# Patient Record
Sex: Female | Born: 1990 | Race: Black or African American | Hispanic: No | Marital: Single | Smoking: Former smoker
Health system: Southern US, Community
[De-identification: ages and names within clinical notes are randomized; demographics above are authoritative.]

## PROBLEM LIST (undated history)

## (undated) ENCOUNTER — Emergency Department (HOSPITAL_COMMUNITY): Admission: EM | Discharge status: Absent without leave | Payer: Self-pay

## (undated) DIAGNOSIS — I10 Essential (primary) hypertension: Secondary | ICD-10-CM

## (undated) DIAGNOSIS — J45909 Unspecified asthma, uncomplicated: Secondary | ICD-10-CM

## (undated) DIAGNOSIS — N809 Endometriosis, unspecified: Secondary | ICD-10-CM

## (undated) DIAGNOSIS — R519 Headache, unspecified: Secondary | ICD-10-CM

## (undated) DIAGNOSIS — E282 Polycystic ovarian syndrome: Secondary | ICD-10-CM

## (undated) DIAGNOSIS — J069 Acute upper respiratory infection, unspecified: Secondary | ICD-10-CM

## (undated) DIAGNOSIS — K219 Gastro-esophageal reflux disease without esophagitis: Secondary | ICD-10-CM

## (undated) DIAGNOSIS — G8929 Other chronic pain: Secondary | ICD-10-CM

## (undated) DIAGNOSIS — L509 Urticaria, unspecified: Secondary | ICD-10-CM

## (undated) DIAGNOSIS — R51 Headache: Secondary | ICD-10-CM

## (undated) HISTORY — PX: TONSILLECTOMY: SHX5618

## (undated) HISTORY — DX: Urticaria, unspecified: L50.9

## (undated) HISTORY — DX: Other chronic pain: G89.29

## (undated) HISTORY — DX: Gastro-esophageal reflux disease without esophagitis: K21.9

## (undated) HISTORY — DX: Headache, unspecified: R51.9

## (undated) HISTORY — DX: Unspecified asthma, uncomplicated: J45.909

## (undated) HISTORY — PX: FINGER SURGERY: SHX640

## (undated) HISTORY — PX: TONSILLECTOMY: SUR1361

## (undated) HISTORY — DX: Acute upper respiratory infection, unspecified: J06.9

## (undated) HISTORY — DX: Headache: R51

## (undated) HISTORY — PX: HAND SURGERY: SHX662

## (undated) NOTE — Telephone Encounter (Signed)
Formatting of this note might be different from the original.  Patient called in regards to Accomodation forms. Wants 1-5 days off a month. Approved by Dr Mahlon Gammon for [phone] for a month. Forms updated and faxed to patient. Fax number [phone]  Electronically signed by Idelia Salm, LPN at 14/78/2956  3:17 PM EDT

## (undated) NOTE — Progress Notes (Signed)
Formatting of this note might be different from the original.  She was about 4 days postop from diagnostic laparoscopy was found to have endometriosis she has a history of dysmenorrhea as well.  So after feeling because she had started.  On the day of her cycle and now is in the midst of her.  And knowing that she definitely has a endometriosis this has exacerbated her pain and I will go ahead and give Dilaudid p.o. 2 mg because the oxycodone really did not seem to help too much she can alternate the Dilaudid with ibuprofen or Tylenol as needed as well and she has a follow up with Dr. Mahlon Gammon for her postop check.  The incision sites are clean dry and intact no signs of infection    My total time on this date and for this encounter was 20 minutes which included the following activities preparing to see the patient, obtaining and/or reviewing separately obtained history, performing a medically necessary exam and/or evaluation, ordering medications, tests or procedures, and documenting clinical information in the medical record. This time is independent, non-overlapping and does not include time for any services which are separately reported.    Rob Bunting, MD  Electronically signed by Rob Bunting, MD at 01/07/2023 10:03 AM EDT

## (undated) NOTE — Telephone Encounter (Signed)
Formatting of this note might be different from the original.  Patient called back to the office requesting her forms be faxed to [phone],  Forms faxed to provided number.  Patient was called but did not answer, Left message for patient to return call.   Electronically signed by Dorothe Pea, LPN at 09/81/1914  3:50 PM EDT

## (undated) NOTE — Telephone Encounter (Signed)
Formatting of this note might be different from the original.  Images from the original note were not included.      PA intiated  Electronically signed by Lidia Collum, LPN at 16/03/9603 11:08 AM EDT

## (undated) NOTE — Telephone Encounter (Signed)
Formatting of this note might be different from the original.  advised patient that her paperwork has been signed and faxed to  [employer] [phone].  Patient voiced understanding and all questions and concerns have been answered.  Electronically signed by Dorothe Pea, LPN at 29/52/8413 12:09 PM EDT

## (undated) NOTE — Telephone Encounter (Signed)
Formatting of this note is different from the original.  Images from the original note were not included.  form updated to reflect 1-5d/mo x65mo  Camillo Flaming, MD  [facility]22 hours ago (11:11 AM)     Hi-please change the paperwork to reflect 1-5 days off per month for 6 months. thanks     Electronically signed by Linus Salmons, LPN at 95/62/1308  9:55 AM EDT

## (undated) NOTE — Progress Notes (Signed)
Formatting of this note is different from the original.  Subjective     Patient ID: Angela [name] is a(n) 49 y.o. female.    HPI    31yo P1 s/p dx laparoscopy for CPP/dysmenorrhea/dyspareunia despite COC use.  She was found to have stage 2 endometriosis fairly widely distributed in the pelvis and diaphragm.  Reviewed endometriosis diagnosis and treatment options    She denies sob/cp/ct/n/v/d/f./dysuria/constipation.  Requests additional motrin script.  On menses now and finding it very painful.    We discussed mgt options for endometriosis. She declines a trial of an IUD/nexplanon/depo provera. She would like to try orilissa since continuous contraceptive use has failed to alleviate her pelvic pain and dyspareunia. We reviewed use/SE.  BMI:44    Some left thigh pain since surgery. no associated sob/cp. constant. no leg swelling/redness.    Patient's medications, allergies, past medical, surgical, social and family histories were reviewed and updated as appropriate.  Patient Active Problem List    Diagnosis Date Noted    Endometriosis determined by laparoscopy 01/04/2023    Pelvic pain 12/13/2022    Dysmenorrhea 12/13/2022    Abnormal uterine bleeding 03/15/2022     Outpatient Medications Prior to Visit   Medication Sig    amLODIPine (NORVASC) 2.5 MG tablet Take 2 tablets by mouth nightly.    chlorthalidone 25 MG tablet Take 1 tablet by mouth daily.    diphenhydrAMINE HCl (BENADRYL PO) Take by mouth as needed.    docusate sodium (COLACE) 100 MG capsule Take 1 capsule by mouth 2 times daily.    HYDROmorphone (DILAUDID) 2 MG tablet Take 1 tablet by mouth every 4 hours as needed for pain.    ibuprofen (MOTRIN) 800 MG tablet Take 1 tablet by mouth every 6 hours as needed for pain.  Take with food    norethindrone (MICRONOR) 0.35 MG tablet Take 1 tablet by mouth daily.    oxyCODONE-acetaminophen (PERCOCET) 5-325 MG per tablet Take 1 tablet by mouth every 6 hours as needed for pain.    promethazine (PHENERGAN) 25 MG  tablet Take 1 tablet by mouth every 6 hours as needed for nausea.    promethazine (PHENERGAN) 25 MG tablet Take 1 tablet by mouth every 6 hours as needed for nausea.     Past Medical History:    Endometriosis determined by laparoscopy    Hypertension     Past Surgical History:   Procedure Laterality Date    HAND SURGERY Left     PR LAPAROSCOPY SURG W/BX SINGLE/MULTIPLE N/A 01/04/2023    OPERATIVE AND DIAGNOSTIC LAPAROSCOPY performed by Camillo Flaming, MD at [facility] OR    TONSILLECTOMY       Review of Systems   Constitutional: Negative.    Respiratory: Negative.     Cardiovascular: Negative.    Gastrointestinal: Negative.    Genitourinary:  Positive for dyspareunia, menstrual problem and pelvic pain.   Psychiatric/Behavioral: Negative.       Objective     Physical Exam  Constitutional:       Appearance: Normal appearance. She is obese.   HENT:      Head: Normocephalic and atraumatic.   Abdominal:      Palpations: Abdomen is soft. There is no mass.      Tenderness: There is abdominal tenderness. There is no guarding or rebound.      Comments: no incisional pain but does have lower pelvic tenderness related to current dysmenorrhea/endometriosis pain  no rubor/calor/dolor   Musculoskeletal:  General: Normal range of motion.   Skin:     General: Skin is warm and dry.   Neurological:      General: No focal deficit present.      Mental Status: She is alert and oriented to person, place, and time.   Psychiatric:         Mood and Affect: Mood normal.         Behavior: Behavior normal.     Assessment & Plan     31yo P1 with symptomatic endometriosis/CPP/dyspareunia unresponsive to medical management/left LE pain  -trial of orilissa  menstrual/CPP symptom diary  venous doppler left leg (low clinical suspicion for DVT)  UpToDate info on endometriosis provided for review  Electronically signed by Camillo Flaming, MD at 01/21/2023  9:09 AM EDT

## (undated) NOTE — Telephone Encounter (Signed)
Formatting of this note might be different from the original.  ----- Message from Dorna Leitz sent at 02/14/2023  3:26 PM EDT -----  Regarding: FW: Forms  Patient called back and stated that she never got fax and to see if it can be sent again. One number was wrong on fax number. Please re fax # to [phone]. Thank you  ----- Message -----  From: Dorna Leitz  Sent: 02/14/2023   1:09 PM EDT  To: [facility]  Subject: Forms                                            Reason for the Call:   Patient is calling because forms that were faxed on 8/15 didn't go through so patient would like to know if they can just be faxed to her at [phone] so she can just take them in. Please give her a call to discuss if needed or let her know when faxed. Thank you    Caller's Name (if not the patient):     Relationship to Patient (if not self):    Best Contact Number: [phone]    OK to leave a detailed message?    Additional Comments:           Electronically signed by Verl Dicker, LPN at 57/84/6962  9:47 AM EDT

## (undated) NOTE — Telephone Encounter (Signed)
Formatting of this note might be different from the original.  Left pt. message  Forms has been faxed to [phone]  Electronically signed by Verl Dicker, LPN at 09/81/1914  9:51 AM EDT

## (undated) NOTE — Progress Notes (Signed)
Formatting of this note is different from the original.  Subjective:     Patient ID: Angela [name] is a(n) 12 y.o. female.    HPI    Patient presents today after being seen in the emergency room at [facility] on 12 02/14/2022 for lower back and a right lower quadrant abdominal cramping.  CT scan of the abdomen and pelvis was negative she has also not had a periods since 04/16/2022, hCG was negative.  Patient was started on Sprintec back in September, but has not taken for over 1 month but states that she is still not getting withdrawal bleeds while on Sprintec.  She is sexually active with her husband and not using birth control.  Initial blood pressure today was 150/120 repeat was 140/90.  Patient denies headache or vision changes.  Is having lower abdominal cramping as if she should have a cycle, but no bleeding.  Her TSH and prolactin levels in September were normal.  Patient denies any recent excessive weight gain or loss.    Patient's medications, allergies, past medical, surgical, social and family histories were reviewed and updated as appropriate.  Patient Active Problem List    Diagnosis Date Noted    Abnormal uterine bleeding 03/15/2022     Allergies   Allergen Reactions    Apple Hives    Apple Fruit Extract Hives    Banana Hives    Peanut (Diagnostic) Hives    Peanut-Derived Hives    Pear Hives    Plum Pulp Hives     Outpatient Medications Marked as Taking for the 06/29/22 encounter (Office Visit) with Dehal, Judeth Porch, MD   Medication Sig    diphenhydrAMINE HCl (BENADRYL PO) Take by mouth.    ibuprofen (MOTRIN) 800 MG tablet Take 1 tablet by mouth every 6 hours as needed for pain.  Take with food    promethazine (PHENERGAN) 25 MG tablet Take 1 tablet by mouth every 6 hours as needed.    traZODone (DESYREL) 50 MG tablet Take 1 tablet by mouth nightly.  AT BEDTIME    [DISCONTINUED] norgestimate-ethinyl estradiol (SPRINTEC 28) 0.25-35 MG-MCG per tablet Take 1 tablet by mouth daily.     History reviewed. No  pertinent past medical history.  Past Surgical History:   Procedure Laterality Date    HAND SURGERY Left     TONSILLECTOMY       Social History     Socioeconomic History    Marital status: Unknown     Spouse name: None    Number of children: None    Years of education: None    Highest education level: None   Tobacco Use    Smoking status: Never    Smokeless tobacco: Never   Vaping Use    Vaping Use: Never used   Substance and Sexual Activity    Alcohol use: Yes     Comment: occ    Drug use: Never    Sexual activity: Yes     Partners: Male     Birth control/protection: None, OCP     Review of Systems   Constitutional:  Negative for fatigue and fever.   Eyes:  Negative for visual disturbance.   Respiratory:  Negative for chest tightness and shortness of breath.    Cardiovascular:  Negative for chest pain.   Gastrointestinal:  Positive for abdominal pain.   Genitourinary:  Positive for menstrual problem and pelvic pain. Negative for vaginal bleeding and vaginal discharge.   Musculoskeletal:  Positive for back  pain.   Neurological:  Negative for light-headedness and headaches.       Objective:     Physical Exam  Vitals reviewed.   Constitutional:       Appearance: Normal appearance. She is obese. She is not ill-appearing.   Musculoskeletal:         General: Normal range of motion.   Skin:     General: Skin is warm and dry.   Neurological:      General: No focal deficit present.      Mental Status: She is alert and oriented to person, place, and time.   Psychiatric:         Mood and Affect: Mood normal.         Behavior: Behavior normal.         Thought Content: Thought content normal.         Judgment: Judgment normal.       Assessment/Plan:       Angela [name] was seen today for menstrual problem.    Diagnoses and all orders for this visit:    Amenorrhea  Uncertain as to the reason for patient's amenorrhea.  Workup so far has been negative.  Blood pressure is still elevated and so we would not be able to restart birth control  pills at this time, although the birth control pills did not achieve cycle control.  We will do progesterone challenge.  Prescription sent in for Provera 10 mg daily for 10 days to induce a withdrawal bleed.  Patient will call office if no withdrawal bleed occurs after discontinuation of the progesterone.  Recommend using condoms for birth control until we have figured out her cycles.-     medroxyPROGESTERone (PROVERA) 10 MG tablet; Take one tablet daily for 10 days each month as needed      Electronically signed by Dalia Heading, MD at 06/29/2022  4:19 PM EST

## (undated) NOTE — Telephone Encounter (Signed)
Formatting of this note might be different from the original.  ----- Message from Shelbie Hutching sent at 01/24/2023  1:02 PM EDT -----  Reason for Call: Pt is calling checking on her paperwork being completed.    Best Contact Number: [phone]    When did symptoms start and for how long?    Additional Information:     Electronically signed by Dorothe Pea, LPN at 16/03/9603 12:08 PM EDT

---

## 2011-02-23 ENCOUNTER — Inpatient Hospital Stay (INDEPENDENT_AMBULATORY_CARE_PROVIDER_SITE_OTHER)
Admission: RE | Admit: 2011-02-23 | Discharge: 2011-02-23 | Disposition: A | Discharge status: Home / Self Care | Payer: BC Managed Care – PPO | Source: Ambulatory Visit | Attending: Emergency Medicine | Admitting: Emergency Medicine

## 2011-02-23 DIAGNOSIS — N390 Urinary tract infection, site not specified: Secondary | ICD-10-CM

## 2011-02-23 DIAGNOSIS — R1032 Left lower quadrant pain: Secondary | ICD-10-CM

## 2011-02-23 LAB — POCT URINALYSIS DIP (DEVICE)
Ketones, ur: NEGATIVE mg/dL
Protein, ur: NEGATIVE mg/dL
Specific Gravity, Urine: 1.02 (ref 1.005–1.030)
pH: 6 (ref 5.0–8.0)

## 2011-02-23 LAB — POCT PREGNANCY, URINE: Preg Test, Ur: NEGATIVE

## 2011-02-23 LAB — WET PREP, GENITAL

## 2011-02-24 ENCOUNTER — Inpatient Hospital Stay (INDEPENDENT_AMBULATORY_CARE_PROVIDER_SITE_OTHER)
Admission: RE | Admit: 2011-02-24 | Discharge: 2011-02-24 | Disposition: A | Discharge status: Home / Self Care | Payer: BC Managed Care – PPO | Source: Ambulatory Visit | Attending: Emergency Medicine | Admitting: Emergency Medicine

## 2011-02-24 ENCOUNTER — Emergency Department (HOSPITAL_COMMUNITY)
Admission: EM | Admit: 2011-02-24 | Discharge: 2011-02-24 | Disposition: A | Discharge status: Home / Self Care | Payer: BC Managed Care – PPO | Attending: Emergency Medicine | Admitting: Emergency Medicine

## 2011-02-24 ENCOUNTER — Emergency Department (HOSPITAL_COMMUNITY): Payer: BC Managed Care – PPO

## 2011-02-24 DIAGNOSIS — R1032 Left lower quadrant pain: Secondary | ICD-10-CM | POA: Insufficient documentation

## 2011-02-24 DIAGNOSIS — R630 Anorexia: Secondary | ICD-10-CM | POA: Insufficient documentation

## 2011-02-24 DIAGNOSIS — R112 Nausea with vomiting, unspecified: Secondary | ICD-10-CM | POA: Insufficient documentation

## 2011-02-24 DIAGNOSIS — R109 Unspecified abdominal pain: Secondary | ICD-10-CM

## 2011-02-24 LAB — COMPREHENSIVE METABOLIC PANEL
AST: 19 U/L (ref 0–37)
Albumin: 4 g/dL (ref 3.5–5.2)
Alkaline Phosphatase: 40 U/L (ref 39–117)
Chloride: 101 mEq/L (ref 96–112)
Potassium: 3.5 mEq/L (ref 3.5–5.1)
Sodium: 134 mEq/L — ABNORMAL LOW (ref 135–145)
Total Bilirubin: 0.4 mg/dL (ref 0.3–1.2)

## 2011-02-24 LAB — DIFFERENTIAL
Basophils Absolute: 0 10*3/uL (ref 0.0–0.1)
Basophils Relative: 0 % (ref 0–1)
Eosinophils Absolute: 0 10*3/uL (ref 0.0–0.7)
Eosinophils Relative: 0 % (ref 0–5)
Lymphocytes Relative: 44 % (ref 12–46)

## 2011-02-24 LAB — CBC
Platelets: 226 10*3/uL (ref 150–400)
RDW: 11.5 % (ref 11.5–15.5)
WBC: 5.2 10*3/uL (ref 4.0–10.5)

## 2011-02-24 LAB — GC/CHLAMYDIA PROBE AMP, GENITAL: GC Probe Amp, Genital: NEGATIVE

## 2011-02-24 LAB — POCT URINALYSIS DIP (DEVICE)
Nitrite: NEGATIVE
pH: 8 (ref 5.0–8.0)

## 2011-10-17 IMAGING — CT CT ABD-PELV W/O CM
2 of 4 series · 17 of 46 positions shown, 19 images · non-contrast
Comparison: None.

CLINICAL DATA: 19-year-old female with abdominal pain on the left
with nausea and vomiting.

CT ABDOMEN AND PELVIS WITHOUT CONTRAST
TECHNIQUE: Multidetector CT imaging of the abdomen and pelvis was
performed following the standard protocol without intravenous
contrast.

[Series 2: stone <(id) >(id) · axial · 0.70mm/px · z∈[-445,-85]mm · 14 of 78 slices shown, 16 images]
[im 4/78  soft-tissue]
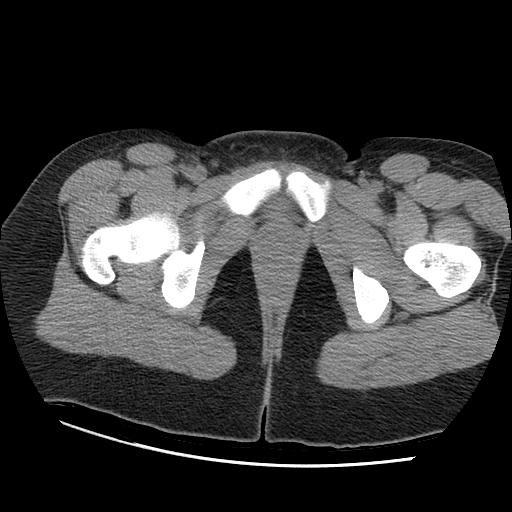
[im 4/78  bone]
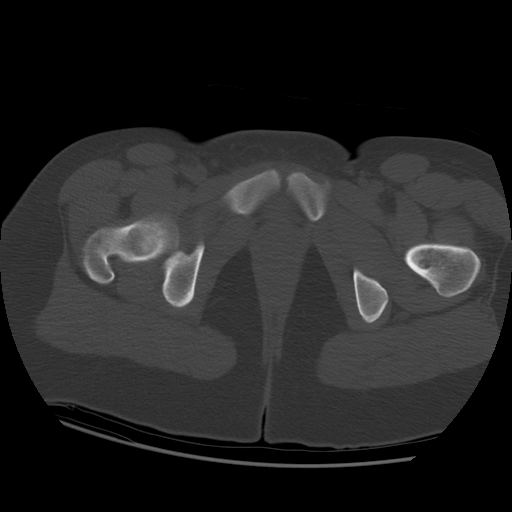
[im 11/78  soft-tissue]
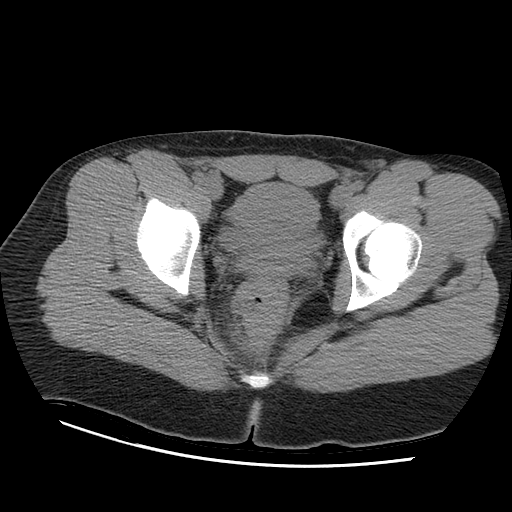
[im 14/78  soft-tissue]
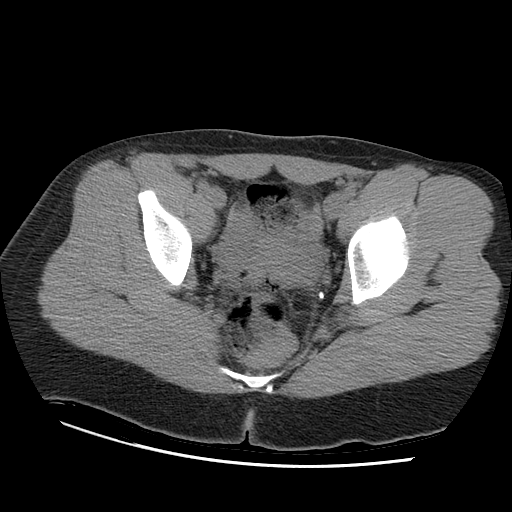
[im 21/78  soft-tissue]
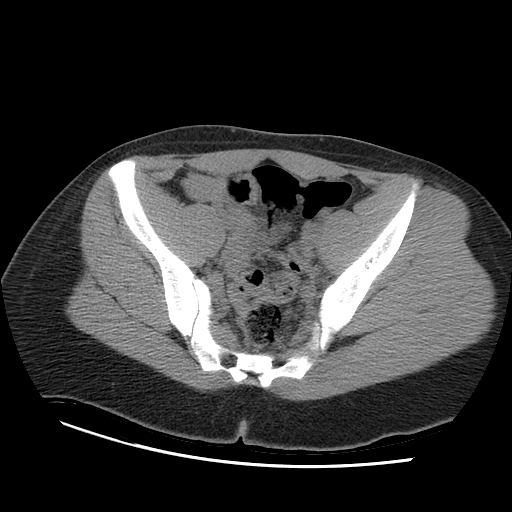
[im 27/78  soft-tissue]
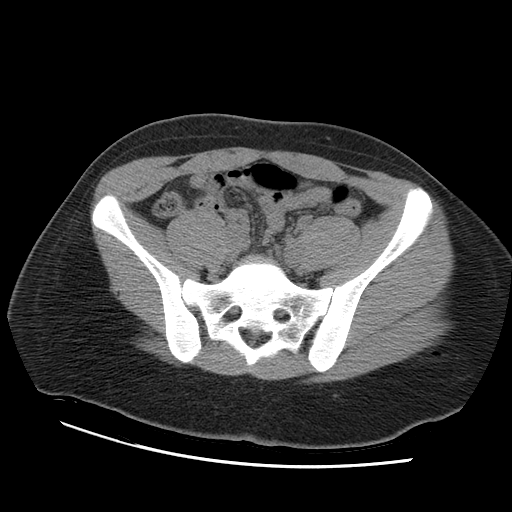
[im 31/78  soft-tissue]
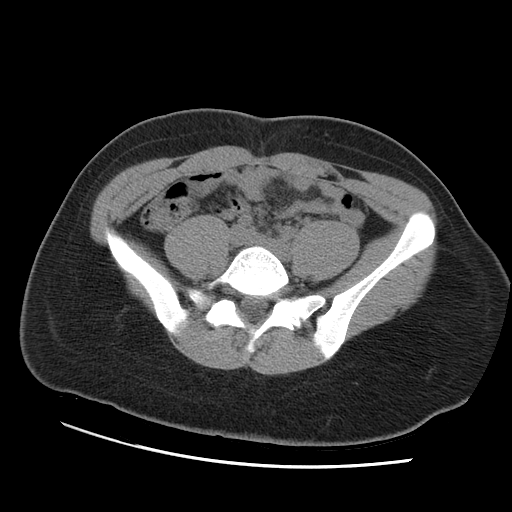
[im 37/78  soft-tissue]
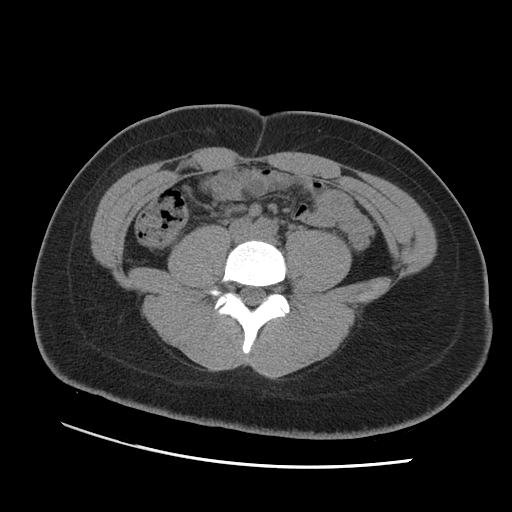
[im 41/78  soft-tissue]
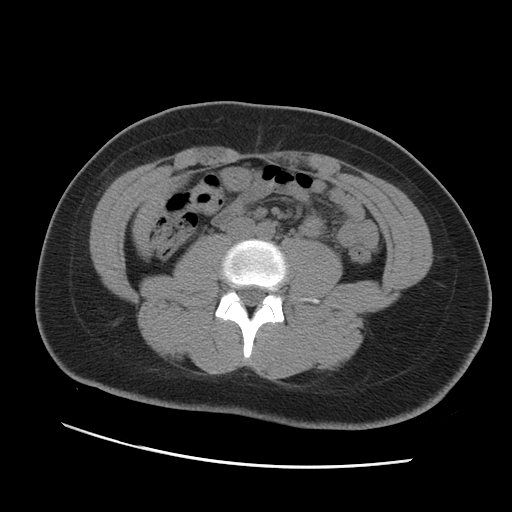
[im 47/78  soft-tissue]
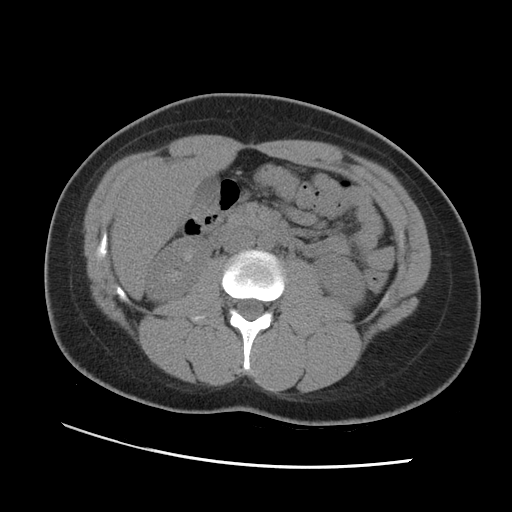
[im 47/78  bone]
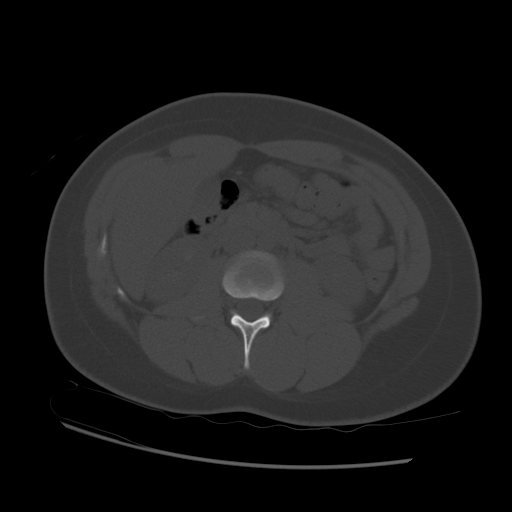
[im 51/78  soft-tissue]
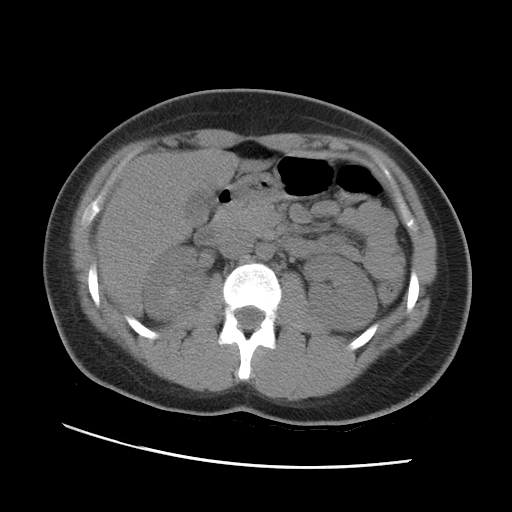
[im 57/78  soft-tissue]
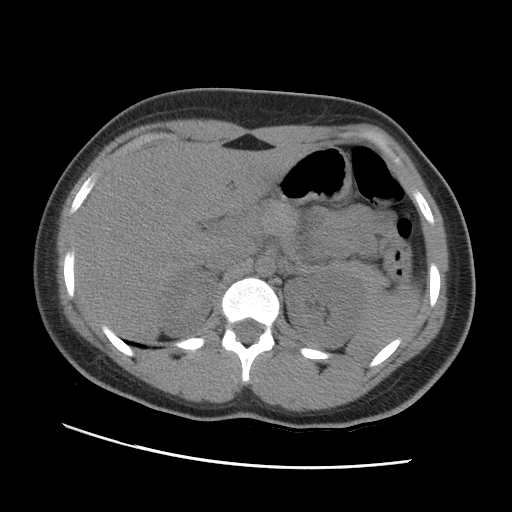
[im 64/78  soft-tissue]
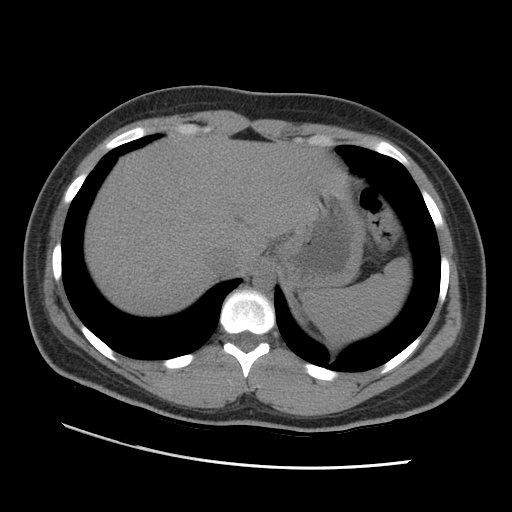
[im 67/78  soft-tissue]
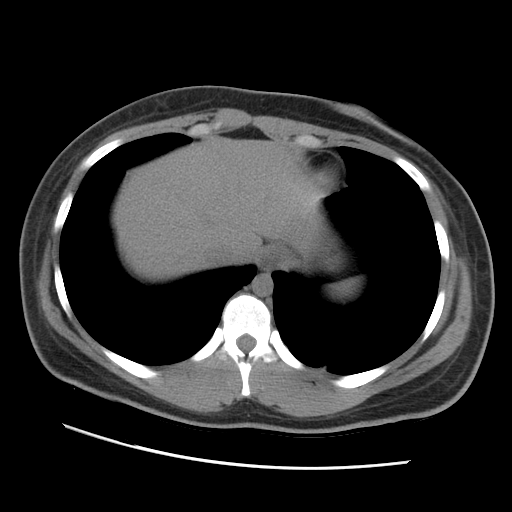
[im 74/78  soft-tissue]
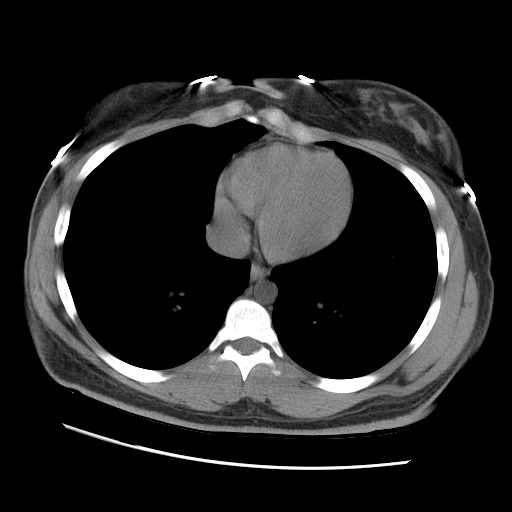

[Series 401: reformatted · coronal · 0.89mm/px · 3 of 88 slices shown]
[im 30/88  soft-tissue]
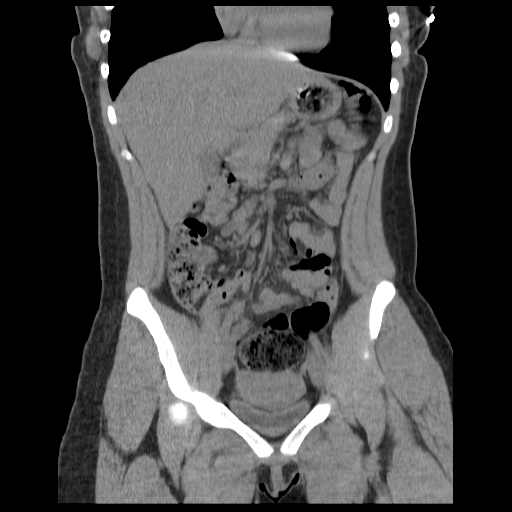
[im 39/88  soft-tissue]
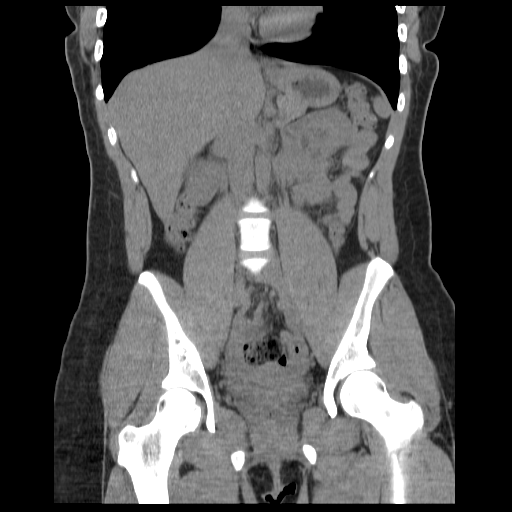
[im 49/88  soft-tissue]
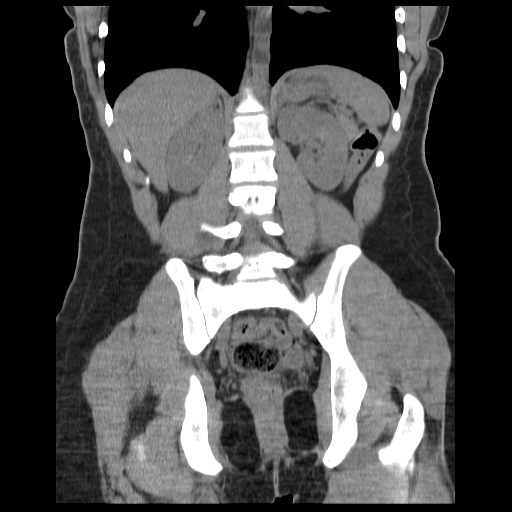

[17 of 46 positions shown; findings below may reference images not displayed]

FINDINGS: Minor atelectasis or scarring in the left costophrenic
sulcus, otherwise clear lung bases. No acute osseous abnormality
identified.  No pelvic free fluid.  Negative noncontrast uterus and
adnexa.  Retained stool the distal colon.  Bladder is decompressed.
More proximal colon is largely decompressed.  Normal appendix.  No
dilated small bowel.  Negative noncontrast liver, gallbladder,
spleen, pancreas, and adrenal glands.

Hyperdense bilateral renal pyramids.  No nephrolithiasis.  No
perinephric stranding or hydronephrosis.  No proximal hydroureter.
Occasional pelvic phleboliths, no evidence of ureteral calculus. No
abdominal free fluid.
IMPRESSION: No obstructive uropathy, urologic calculus, or acute finding
identified in the abdomen or pelvis. Normal appendix.  Hyperdense
renal pyramids which can be seen in the setting of dehydration.

## 2014-07-26 HISTORY — PX: HAND SURGERY: SHX662

## 2014-10-21 ENCOUNTER — Emergency Department (HOSPITAL_BASED_OUTPATIENT_CLINIC_OR_DEPARTMENT_OTHER): Payer: Worker's Compensation

## 2014-10-21 ENCOUNTER — Emergency Department (HOSPITAL_BASED_OUTPATIENT_CLINIC_OR_DEPARTMENT_OTHER)
Admission: EM | Admit: 2014-10-21 | Discharge: 2014-10-21 | Disposition: A | Discharge status: Home / Self Care | Payer: Worker's Compensation | Attending: Emergency Medicine | Admitting: Emergency Medicine

## 2014-10-21 ENCOUNTER — Encounter (HOSPITAL_BASED_OUTPATIENT_CLINIC_OR_DEPARTMENT_OTHER): Payer: Self-pay | Admitting: *Deleted

## 2014-10-21 DIAGNOSIS — W273XXA Contact with needle (sewing), initial encounter: Secondary | ICD-10-CM | POA: Diagnosis not present

## 2014-10-21 DIAGNOSIS — Y9289 Other specified places as the place of occurrence of the external cause: Secondary | ICD-10-CM | POA: Insufficient documentation

## 2014-10-21 DIAGNOSIS — Y990 Civilian activity done for income or pay: Secondary | ICD-10-CM | POA: Diagnosis not present

## 2014-10-21 DIAGNOSIS — S61241A Puncture wound with foreign body of left index finger without damage to nail, initial encounter: Secondary | ICD-10-CM | POA: Diagnosis not present

## 2014-10-21 DIAGNOSIS — Z720 Tobacco use: Secondary | ICD-10-CM | POA: Insufficient documentation

## 2014-10-21 DIAGNOSIS — Y9389 Activity, other specified: Secondary | ICD-10-CM | POA: Diagnosis not present

## 2014-10-21 DIAGNOSIS — S6992XA Unspecified injury of left wrist, hand and finger(s), initial encounter: Secondary | ICD-10-CM | POA: Diagnosis present

## 2014-10-21 MED ORDER — CEPHALEXIN 500 MG PO CAPS: 500.0000 mg | ORAL_CAPSULE | Freq: Four times a day (QID) | ORAL | Status: DC

## 2014-10-21 MED ORDER — HYDROCODONE-ACETAMINOPHEN 5-325 MG PO TABS: 1.0000 | ORAL_TABLET | Freq: Four times a day (QID) | ORAL | Status: DC | PRN

## 2014-10-21 MED ORDER — LIDOCAINE HCL 2 % IJ SOLN: 5.0000 mL | Freq: Once | INTRAMUSCULAR | Status: DC

## 2014-10-21 MED ORDER — LIDOCAINE HCL (PF) 2 % IJ SOLN
INTRAMUSCULAR | Status: AC
  Administered 2014-10-21: 1 mL
  Filled 2014-10-21: qty 2

## 2014-10-21 MED ORDER — HYDROCODONE-ACETAMINOPHEN 5-325 MG PO TABS
2.0000 | ORAL_TABLET | Freq: Once | ORAL | Status: AC
  Administered 2014-10-21: 2 via ORAL
  Filled 2014-10-21: qty 2

## 2014-10-21 NOTE — ED Provider Notes (Signed)
CSN: [id number]     Arrival date & time 10/21/14  2005 History  This chart was scribed for Geoffery Lyons, MD by Ronney Lion, ED Scribe. This patient was seen in room MHFT1/MHFT1 and the patient's care was started at 10:03 PM.    Chief Complaint  Patient presents with  . Finger Injury   Patient is a 24 y.o. female presenting with hand pain. The history is provided by the patient. No language interpreter was used.  Hand Pain This is a new problem. The current episode started 1 to 2 hours ago. The problem occurs constantly. The problem has not changed since onset.Pertinent negatives include no chest pain, no abdominal pain, no headaches and no shortness of breath. Exacerbated by: touching the needle that is embedded. Nothing relieves the symptoms. She has tried nothing for the symptoms.     HPI Comments: Buford Gayler is a 24 y.o. female who presents to the Emergency Department complaining of a left index finger injury that occurred earlier at work today, about 2 hours ago. Patient was using an embroidery machine at work when the needle broke off and embedded into her left index finger. She denies trying to pull it out, as touching the needle exacerbates the pain.  History reviewed. No pertinent past medical history. History reviewed. No pertinent past surgical history. No family history on file. History  Substance Use Topics  . Smoking status: Current Some Day Smoker  . Smokeless tobacco: Not on file  . Alcohol Use: No   OB History    No data available     Review of Systems  Respiratory: Negative for shortness of breath.   Cardiovascular: Negative for chest pain.  Gastrointestinal: Negative for abdominal pain.  Musculoskeletal: Positive for myalgias.  Neurological: Negative for headaches.  All other systems reviewed and are negative.   Allergies  Review of patient's allergies indicates no known allergies.  Home Medications   Prior to Admission medications   Not on File   BP  150/100 mmHg  Pulse 94  Temp(Src) 98.6 F (37 C) (Oral)  Resp 20  Ht  (1.626 m)  Wt 185 lb (83.915 kg)  BMI 31.74 kg/m2  SpO2 100%  LMP 09/26/2014 Physical Exam  HENT:  Mouth/Throat: Oropharynx is clear and moist. No oropharyngeal exudate.  Eyes: EOM are normal. Pupils are equal, round, and reactive to light.  Cardiovascular: Normal rate, regular rhythm and normal heart sounds.   Pulmonary/Chest: Effort normal and breath sounds normal. No respiratory distress. She has no wheezes. She has no rales.  Abdominal: Soft. Bowel sounds are normal. There is no tenderness. There is no rebound and no guarding.  Neurological: She has normal reflexes. No cranial nerve deficit.  Nursing note and vitals reviewed.   ED Course  Procedures (including critical care time)  DIAGNOSTIC STUDIES: Oxygen Saturation is 100% on RA, normal by my interpretation.    COORDINATION OF CARE: 10:06 PM - Discussed treatment plan with pt at bedside which includes consultation with a hand specialist, and pt agreed to plan.  Labs Review Labs Reviewed - No data to display  Imaging Review Dg Hand Complete Left  10/21/2014   CLINICAL DATA:  A number uterine needle broke off and left index finger while at work. Initial encounter.  EXAM: LEFT HAND - COMPLETE 3+ VIEW  COMPARISON:  None.  FINDINGS: A 9 mm metallic density is noted extending through the second distal interphalangeal joint. No definite osseous disruption is seen, though this is difficult to  fully assess given the location of the needle. It is approximately 4 mm deep to the dorsal skin surface, and 2-3 mm deep to the volar skin surface.  Remaining visualized osseous structures are unremarkable. The carpal rows appear grossly intact.  IMPRESSION: Metallic needle extending through the second distal interphalangeal joint. No definite osseous disruption seen, though this is difficult to fully assess given the location of the needle. The needle is 4 mm deep to  the dorsal skin surface, and 2-3 mm deep to the volar skin surface.   Electronically Signed   By: Roanna RaiderJeffery  Chang M.D.   On: 10/21/2014 21:28     EKG Interpretation None      MDM   Final diagnoses:  None   Xrays show an intra-articular fb in the dip joint.  In consultation with Dr. Janee Mornhompson from hand surgery, a digital block was administered and I attempted to back the needle out with the string that was still attached.  This was unsuccessful as the string broke.  A small incision was made to the flexor aspect of the finger and I was finally able to grasp and remove the fb.  A dressing will be applied.  Will treat with keflex, pain meds, and followup with Dr. Janee Mornhompson this week to ensure proper healing.  I personally performed the services described in this documentation, which was scribed in my presence. The recorded information has been reviewed and is accurate.      Geoffery Lyonsouglas Iaan Oregel, MD 10/22/14 [phone]

## 2014-10-21 NOTE — Discharge Instructions (Signed)
Keflex as prescribed.  Hydrocodone as prescribed as needed for pain.  Follow-up with Dr. Janee Mornhompson in the hand surgery clinic. His office will call you to arrange this appointment. If you have not heard from them by tomorrow afternoon, please call the provided number.   Laceration Care, Adult A laceration is a cut or lesion that goes through all layers of the skin and into the tissue just beneath the skin. TREATMENT  Some lacerations may not require closure. Some lacerations may not be able to be closed due to an increased risk of infection. It is important to see your caregiver as soon as possible after an injury to minimize the risk of infection and maximize the opportunity for successful closure. If closure is appropriate, pain medicines may be given, if needed. The wound will be cleaned to help prevent infection. Your caregiver will use stitches (sutures), staples, wound glue (adhesive), or skin adhesive strips to repair the laceration. These tools bring the skin edges together to allow for faster healing and a better cosmetic outcome. However, all wounds will heal with a scar. Once the wound has healed, scarring can be minimized by covering the wound with sunscreen during the day for 1 full year. HOME CARE INSTRUCTIONS  For sutures or staples:  Keep the wound clean and dry.  If you were given a bandage (dressing), you should change it at least once a day. Also, change the dressing if it becomes wet or dirty, or as directed by your caregiver.  Wash the wound with soap and water 2 times a day. Rinse the wound off with water to remove all soap. Pat the wound dry with a clean towel.  After cleaning, apply a thin layer of the antibiotic ointment as recommended by your caregiver. This will help prevent infection and keep the dressing from sticking.  You may shower as usual after the first 24 hours. Do not soak the wound in water until the sutures are removed.  Only take over-the-counter or  prescription medicines for pain, discomfort, or fever as directed by your caregiver.  Get your sutures or staples removed as directed by your caregiver. For skin adhesive strips:  Keep the wound clean and dry.  Do not get the skin adhesive strips wet. You may bathe carefully, using caution to keep the wound dry.  If the wound gets wet, pat it dry with a clean towel.  Skin adhesive strips will fall off on their own. You may trim the strips as the wound heals. Do not remove skin adhesive strips that are still stuck to the wound. They will fall off in time. For wound adhesive:  You may briefly wet your wound in the shower or bath. Do not soak or scrub the wound. Do not swim. Avoid periods of heavy perspiration until the skin adhesive has fallen off on its own. After showering or bathing, gently pat the wound dry with a clean towel.  Do not apply liquid medicine, cream medicine, or ointment medicine to your wound while the skin adhesive is in place. This may loosen the film before your wound is healed.  If a dressing is placed over the wound, be careful not to apply tape directly over the skin adhesive. This may cause the adhesive to be pulled off before the wound is healed.  Avoid prolonged exposure to sunlight or tanning lamps while the skin adhesive is in place. Exposure to ultraviolet light in the first year will darken the scar.  The skin adhesive will  usually remain in place for 5 to 10 days, then naturally fall off the skin. Do not pick at the adhesive film. You may need a tetanus shot if:  You cannot remember when you had your last tetanus shot.  You have never had a tetanus shot. If you get a tetanus shot, your arm may swell, get red, and feel warm to the touch. This is common and not a problem. If you need a tetanus shot and you choose not to have one, there is a rare chance of getting tetanus. Sickness from tetanus can be serious. SEEK MEDICAL CARE IF:   You have redness,  swelling, or increasing pain in the wound.  You see a red line that goes away from the wound.  You have yellowish-white fluid (pus) coming from the wound.  You have a fever.  You notice a bad smell coming from the wound or dressing.  Your wound breaks open before or after sutures have been removed.  You notice something coming out of the wound such as wood or glass.  Your wound is on your hand or foot and you cannot move a finger or toe. SEEK IMMEDIATE MEDICAL CARE IF:   Your pain is not controlled with prescribed medicine.  You have severe swelling around the wound causing pain and numbness or a change in color in your arm, hand, leg, or foot.  Your wound splits open and starts bleeding.  You have worsening numbness, weakness, or loss of function of any joint around or beyond the wound.  You develop painful lumps near the wound or on the skin anywhere on your body. MAKE SURE YOU:   Understand these instructions.  Will watch your condition.  Will get help right away if you are not doing well or get worse. Document Released: 06/14/2005 Document Revised: 09/06/2011 Document Reviewed: 12/08/2010 [facility] Patient Information 2015 [place], [place]. This information is not intended to replace advice given to you by your health care provider. Make sure you discuss any questions you have with your health care provider.

## 2014-10-21 NOTE — ED Notes (Signed)
MD at bedside trying to remove needle

## 2014-10-21 NOTE — ED Notes (Signed)
An embroidery needle broke off in her left index finger.

## 2015-06-13 IMAGING — DX DG HAND COMPLETE 3+V*L*
4 series · 4 of 4 positions shown · non-contrast
Comparison: None.

CLINICAL DATA: A number uterine needle broke off and left index
finger while at work. Initial encounter.

EXAM:
LEFT HAND - COMPLETE 3+ VIEW

[hand pa]
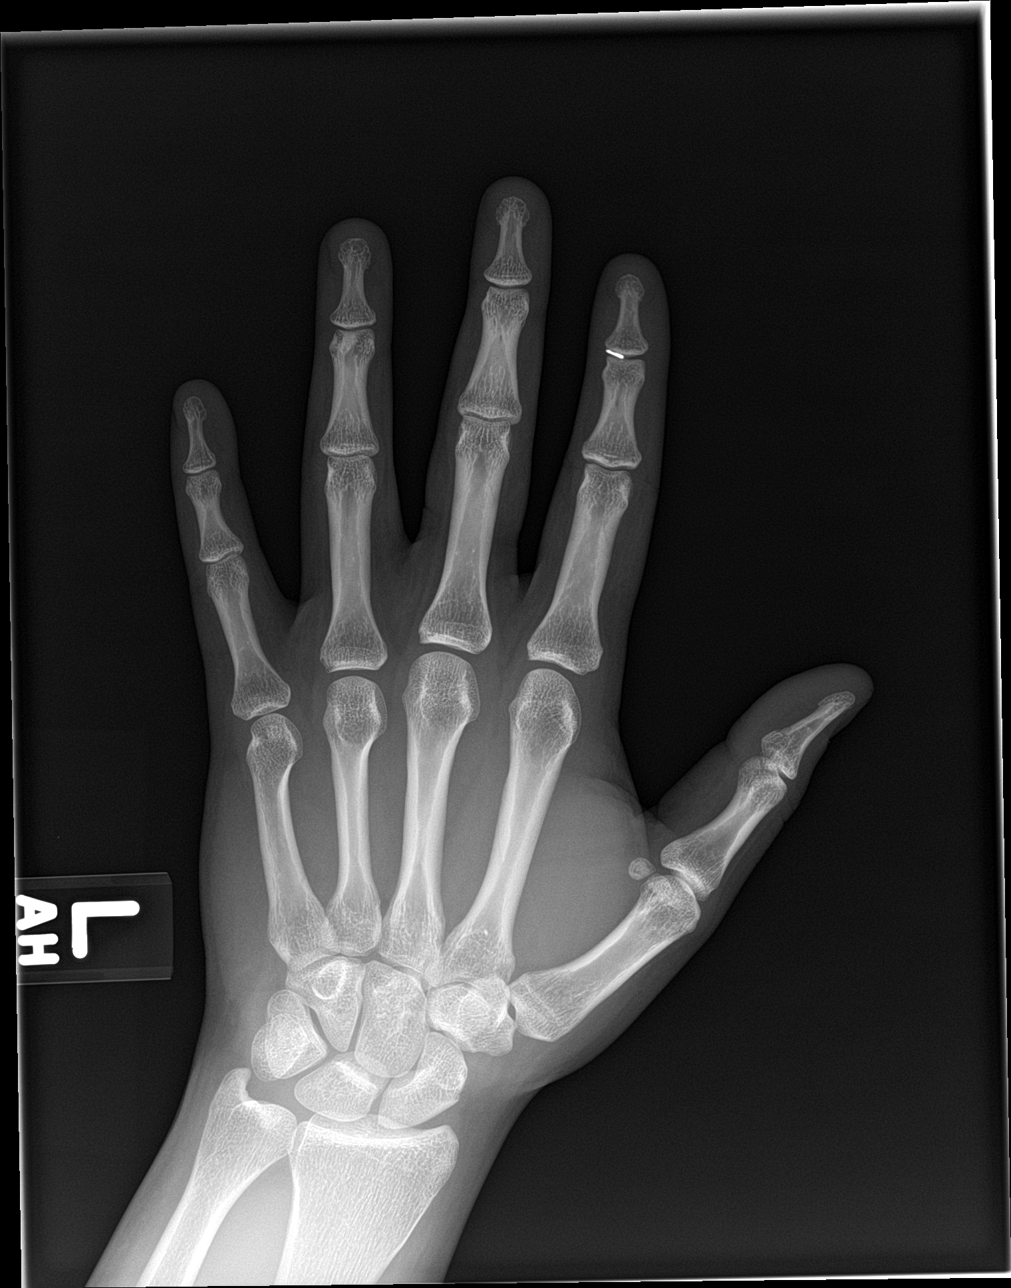

[hand obl (1 of 2)]
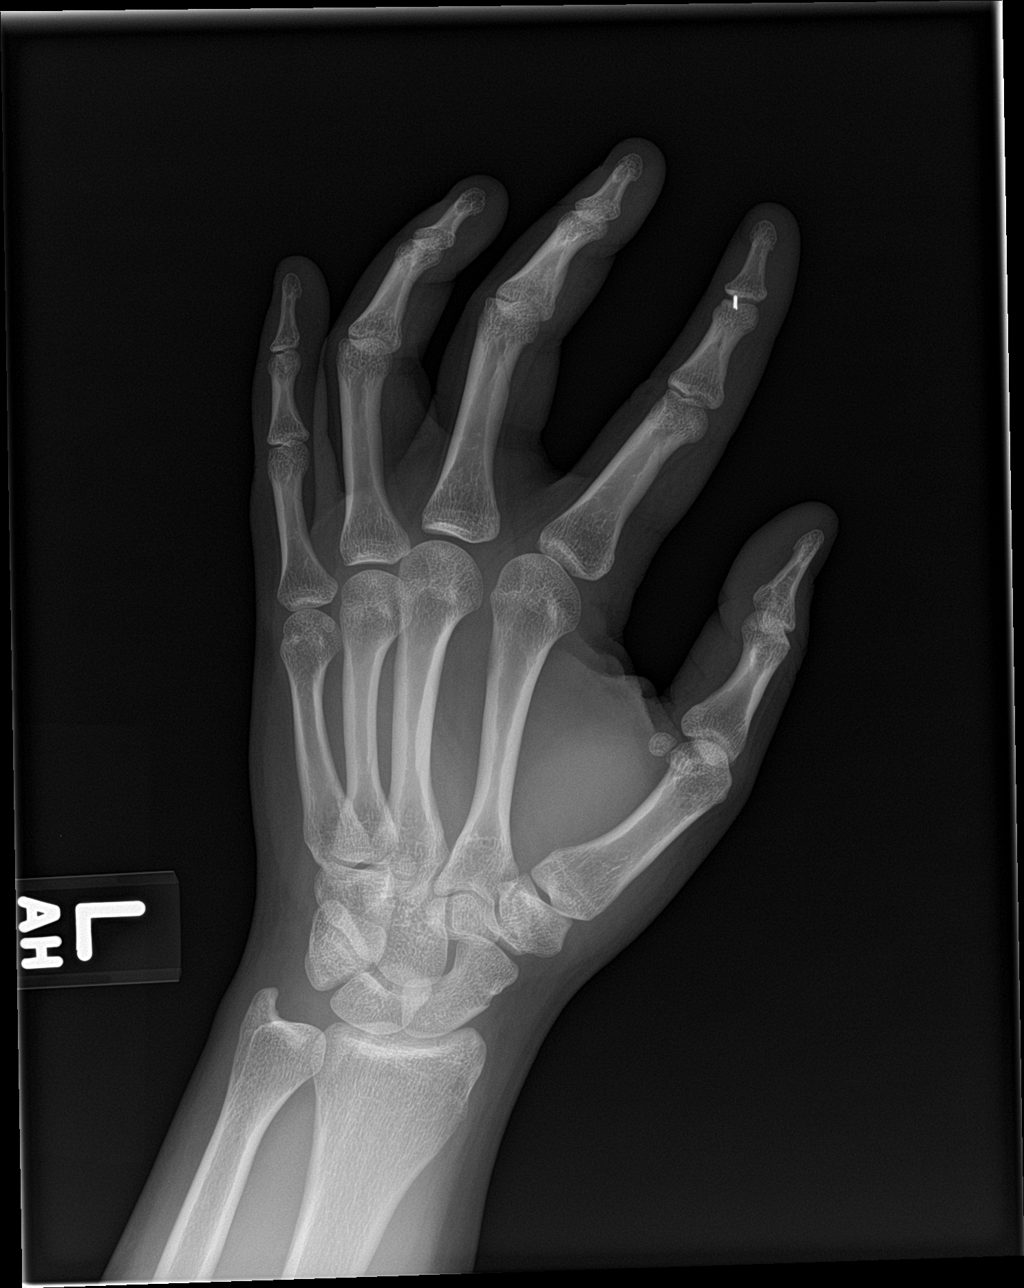

[hand lat]
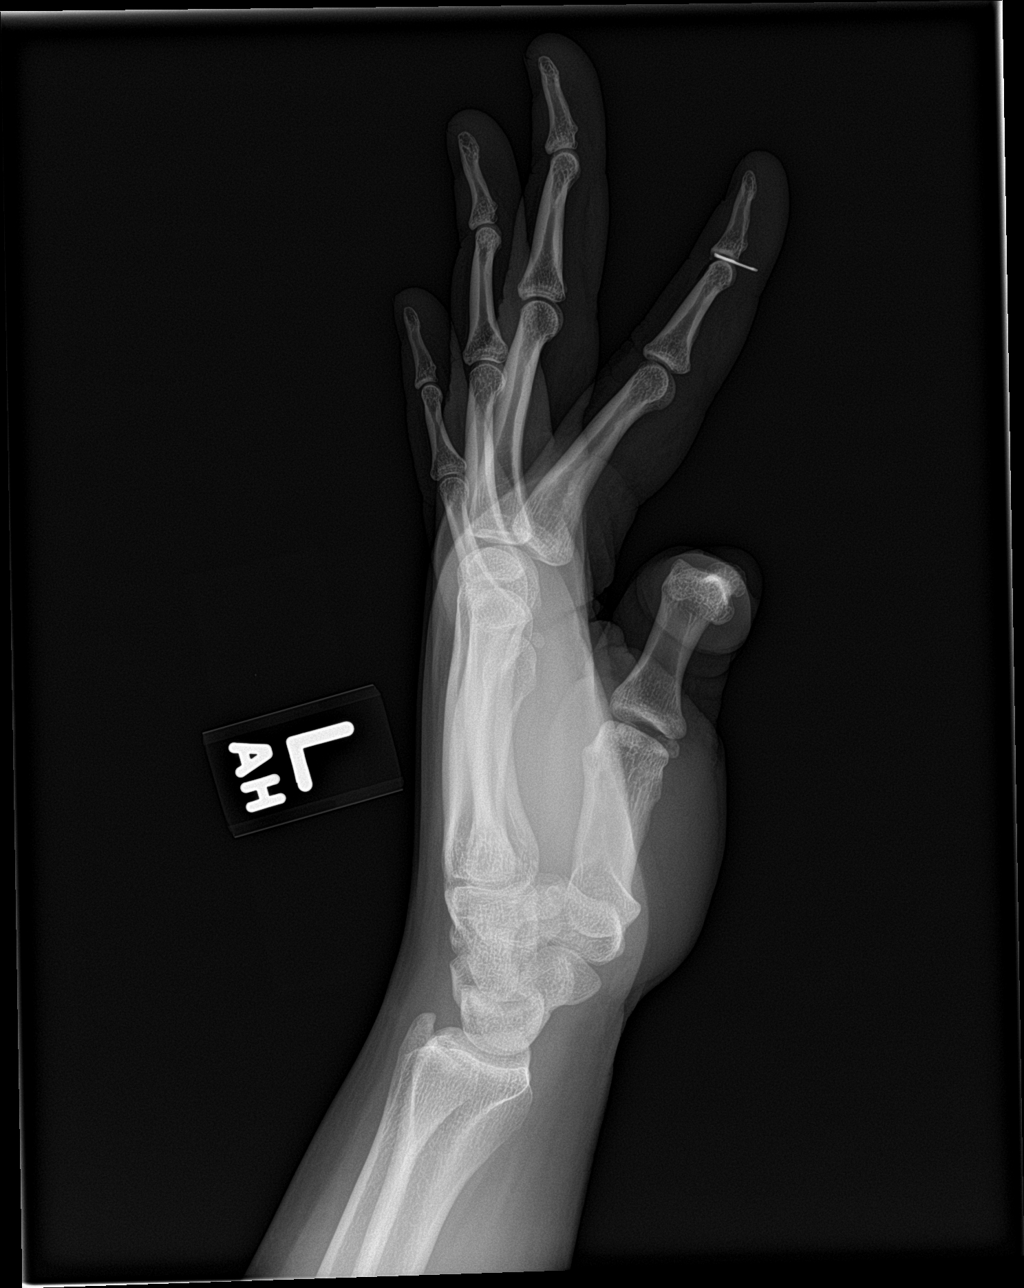

[hand obl (2 of 2)]
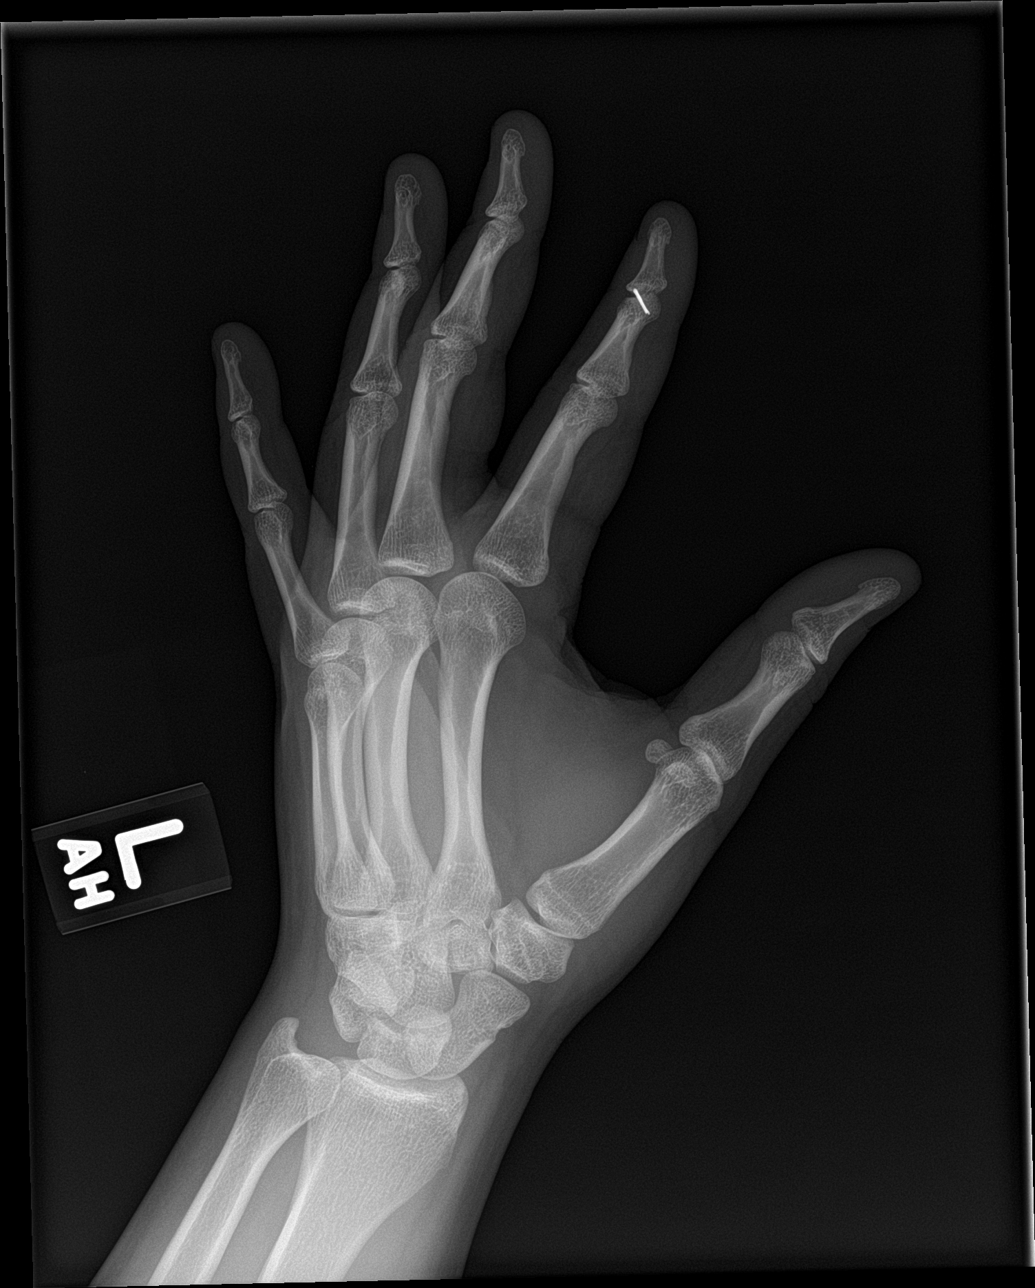

[4 of 4 positions shown; findings below may reference images not displayed]

FINDINGS: A 9 mm metallic density is noted extending through the second distal
interphalangeal joint. No definite osseous disruption is seen,
though this is difficult to fully assess given the location of the
needle. It is approximately 4 mm deep to the dorsal skin surface,
and 2-3 mm deep to the volar skin surface.

Remaining visualized osseous structures are unremarkable. The carpal
rows appear grossly intact.
IMPRESSION: Metallic needle extending through the second distal interphalangeal
joint. No definite osseous disruption seen, though this is difficult
to fully assess given the location of the needle. The needle is 4 mm
deep to the dorsal skin surface, and 2-3 mm deep to the volar skin
surface.

## 2015-08-14 ENCOUNTER — Encounter: Payer: Self-pay | Admitting: *Deleted

## 2015-08-14 ENCOUNTER — Encounter: Payer: Self-pay | Admitting: Internal Medicine

## 2015-08-14 ENCOUNTER — Ambulatory Visit (INDEPENDENT_AMBULATORY_CARE_PROVIDER_SITE_OTHER): Payer: No Typology Code available for payment source | Admitting: Internal Medicine

## 2015-08-14 VITALS — BP 126/84 | HR 114 | Ht 63.0 in | Wt 213.8 lb

## 2015-08-14 DIAGNOSIS — R05 Cough: Secondary | ICD-10-CM | POA: Diagnosis not present

## 2015-08-14 DIAGNOSIS — R058 Other specified cough: Secondary | ICD-10-CM | POA: Insufficient documentation

## 2015-08-14 LAB — NITRIC OXIDE: NITRIC OXIDE: 6

## 2015-08-14 MED ORDER — TRAMADOL HCL 50 MG PO TABS: ORAL_TABLET | ORAL | Status: DC

## 2015-08-14 MED ORDER — PREDNISONE 10 MG PO TABS: ORAL_TABLET | ORAL | Status: DC

## 2015-08-14 MED ORDER — FAMOTIDINE 20 MG PO TABS: ORAL_TABLET | ORAL | Status: DC

## 2015-08-14 MED ORDER — PANTOPRAZOLE SODIUM 40 MG PO TBEC: 40.0000 mg | DELAYED_RELEASE_TABLET | Freq: Every day | ORAL | Status: DC

## 2015-08-14 NOTE — Assessment & Plan Note (Addendum)
Spirometry 08/14/2015  wnl in effort indep section  NO 08/14/2015  = 6  So very unlikely any allergy related cough    Of the three most common causes of chronic cough, only one (GERD)  can actually cause the other two (asthma and post nasal drip syndrome)  and perpetuate the cylce of cough inducing airway trauma, inflammation, heightened sensitivity to reflux which is prompted by the cough itself via a cyclical mechanism.   BCPS and wt gain are the major risk factors    This may partially respond to steroids and look like asthma and post nasal drainage but never erradicated completely unless the cough and the secondary reflux are eliminated, preferably both at the same time.  While not intuitively obvious, many patients with chronic low grade reflux do not cough until there is a secondary insult that disturbs the protective epithelial barrier and exposes sensitive nerve endings.  This can be viral or direct physical injury such as with an endotracheal tube.  The point is that once this occurs, it is difficult to eliminate using anything but a maximally effective acid suppression regimen at least in the short run, accompanied by an appropriate diet to address non acid GERD.   Will eliminate cyclical cough and gerd at the same time then regroup with possible Methacholine challenge if not 100% better with short course rx but I seriously doubt she has any form of asthma   I had an extended discussion with the patient reviewing all relevant studies completed to date and  lasting 35 minutes of a visit    Each maintenance medication was reviewed in detail including most importantly the difference between maintenance and prns and under what circumstances the prns are to be triggered using an action plan format that is not reflected in the computer generated alphabetically organized AVS.    Please see instructions for details which were reviewed in writing and the patient given a copy highlighting  the part that I personally wrote and discussed at today's ov.

## 2015-08-14 NOTE — Progress Notes (Signed)
Subjective:    Patient ID: Jessica Knapp, female    DOB: [birth year],   MRN: [id number]  HPI  30 yobm active smoker with variable loss of voice age 25 about the time she started HS resolved on its own w/in 2 weeks did not affect ability to play BB competitively then around  Age 7 would not resolve p 2 weeks requiring pred about every other month between  Then  late summer 2016 noted sob and hoarse that didn't improve so referred to pulmonary clinic 08/14/2015 by Jessica Knapp.   08/14/2015 1st [facility] office visit/ Jessica Knapp   Chief Complaint  Patient presents with  . Pulmonary Consult    Referred by Jessica Knapp. Pt c/o SOB for the past 6 months. She gets SOB walking from room to room at home. She also c/o laryngitis- since 2008 she "loses vioce" approx 2 x per month. She also c/o non prod cough. She is using albuterol hfa approx 6 x per day.   already used albuterol 1 h before / no better p pred rx. Never sob unless also cough/hoarse.  Settles down at hs  No obvious  patterns in day to day or daytime variabilty or assoc  excess/ purulent sputum or mucus plugs   or cp or chest tightness, subjective wheeze overt sinus or hb symptoms. No unusual exp hx or h/o childhood pna/ asthma or knowledge of premature birth.  Sleeping ok without nocturnal  or early am exacerbation  of respiratory  c/o's or need for noct saba. Also denies any obvious fluctuation of symptoms with weather or environmental changes or other aggravating or alleviating factors except as outlined above   Current Medications, Allergies, Complete Past Medical History, Past Surgical History, Family History, and Social History were reviewed in [employer] record.            Review of Systems  Constitutional: Positive for appetite change. Negative for fever, chills and unexpected weight change.  HENT: Positive for sore throat, trouble swallowing and voice change. Negative for congestion, dental problem,  ear pain, nosebleeds, postnasal drip, rhinorrhea, sinus pressure and sneezing.   Eyes: Negative for visual disturbance.  Respiratory: Positive for cough and shortness of breath. Negative for choking.   Cardiovascular: Positive for chest pain. Negative for leg swelling.  Gastrointestinal: Negative for vomiting, abdominal pain and diarrhea.  Genitourinary: Negative for difficulty urinating.  Musculoskeletal: Negative for arthralgias.  Skin: Negative for rash.  Neurological: Positive for headaches. Negative for tremors and syncope.  Hematological: Does not bruise/bleed easily.       Objective:   Physical Exam  amb bf extremely hoarse/ classic psueudowheeze  Wt Readings from Last 3 Encounters:  08/14/15 213 lb 12.8 oz (96.979 kg)  10/21/14 185 lb (83.915 kg)    Vital signs reviewed  HEENT: nl dentition, turbinates, and oropharynx. Nl external ear canals without cough reflex   NECK :  without JVD/Nodes/TM/ nl carotid upstrokes bilaterally   LUNGS: no acc muscle use,  Nl contour chest which is clear to A and P bilaterally without cough on insp or exp maneuvers   CV:  RRR  no s3 or murmur or increase in P2, no edema   ABD:  soft and nontender with nl inspiratory excursion in the supine position. No bruits or organomegaly, bowel sounds nl  MS:  Nl gait/ ext warm without deformities, calf tenderness, cyanosis or clubbing No obvious joint restrictions   SKIN: warm and dry without lesions  NEURO:  alert, approp, nl sensorium with  no motor deficits    cxr done 2 d prior to OV  Per pt ok at Jessica Knapp office       Assessment & Plan:

## 2015-08-14 NOTE — Patient Instructions (Addendum)
The key to effective treatment for your cough is eliminating the non-stop cycle of cough you're stuck in long enough to let your airway heal completely and then see if there is anything still making you cough once you stop the cough suppression, but this should take no more than 5 days to figure out  First take delsym two tsp every 12 hours and supplement if needed with  tramadol 50 mg up to 1-2 every 4 hours to suppress the urge to cough at all or even clear your throat. Swallowing water or using ice chips/non mint and menthol containing candies (such as lifesavers or sugarless jolly ranchers) are also effective.  You should rest your voice and avoid activities that you know make you cough.  Once you have eliminated the cough for 3 straight days try reducing the tramadol first,  then the delsym as tolerated.      Protonix (pantoprazole) Take 30-60 min before first meal of the day and Pepcid 20 mg one hour before bedtime plus Chlorpheniramine 4 mg x 2 at bedtime (both available over the counter)  Until no coughing off all cough suppression   GERD (REFLUX)  is an extremely common cause of respiratory symptoms, many times with no significant heartburn at all.    It can be treated with medication, but also with lifestyle changes including avoidance of late meals, excessive alcohol, smoking cessation, and avoid fatty foods, chocolate, peppermint, colas, red wine, and acidic juices such as orange juice.  NO MINT OR MENTHOL PRODUCTS SO NO COUGH DROPS  USE HARD CANDY INSTEAD (jolley ranchers or Stover's or Lifesavers (all available in sugarless versions) NO OIL BASED VITAMINS - use powdered substitutes.  Return in 2 weeks if not all better.

## 2015-08-18 ENCOUNTER — Telehealth: Payer: Self-pay | Admitting: Internal Medicine

## 2015-08-18 NOTE — Telephone Encounter (Signed)
Called and spoke with pt. She c/o SOB, chest tightness, dry cough and hoariness. Denies any fever, nausea or vomiting. Pt states she was told at last ov with MW if her symptoms did not improving after stating med's to call our office for a follow up visit. I scheduled her with TP on 08/19/15. She voiced understanding and had no further questions. Nothing further needed at this time.

## 2015-08-19 ENCOUNTER — Ambulatory Visit (INDEPENDENT_AMBULATORY_CARE_PROVIDER_SITE_OTHER): Payer: No Typology Code available for payment source | Admitting: Adult Health

## 2015-08-19 ENCOUNTER — Other Ambulatory Visit: Payer: No Typology Code available for payment source

## 2015-08-19 ENCOUNTER — Encounter: Payer: Self-pay | Admitting: Adult Health

## 2015-08-19 VITALS — BP 136/80 | HR 112 | Temp 98.4°F | Ht 63.0 in | Wt 220.0 lb

## 2015-08-19 DIAGNOSIS — R05 Cough: Secondary | ICD-10-CM

## 2015-08-19 DIAGNOSIS — R0600 Dyspnea, unspecified: Secondary | ICD-10-CM | POA: Insufficient documentation

## 2015-08-19 DIAGNOSIS — R058 Other specified cough: Secondary | ICD-10-CM

## 2015-08-19 NOTE — Addendum Note (Signed)
Addended by: Karalee Height on: 08/19/2015 04:25 PM   Modules accepted: Orders

## 2015-08-19 NOTE — Progress Notes (Signed)
Chart and office note reviewed in detail along with available xrays/ labs > agree with a/p as outlined including ent eval for prob VCD ? Will need referral to [facility] voice center if not making progress with rx in [place]

## 2015-08-19 NOTE — Assessment & Plan Note (Signed)
?   Etiology spirometry nml w/ low NO Reported nml cxr per pt.  No desats with ambulation  Cont treatment aimed at trigger control   Plan  Add Zyrtec  At bedtime   Continue on Delsym 2 tsp Twice daily  For cough.  Labs today .  Refer to ENT for recurrent hoarseness.  Continue on Protonix and Pepcid.  Follow up Dr. Sherene Sires  In 3-4 weeks and As needed   Please contact office for sooner follow up if symptoms do not improve or worsen or seek emergency care

## 2015-08-19 NOTE — Progress Notes (Signed)
Subjective:    Patient ID: Jessica Knapp, female    DOB: March 18, 1991,   MRN: [id number]  HPI  40 yobm active smoker with variable loss of voice age 25 about the time she started HS resolved on its own w/in 2 weeks did not affect ability to play BB competitively then around  Age 25 would not resolve p 2 weeks requiring pred about every other month between  Then  late summer 2016 noted sob and hoarse that didn't improve so referred to pulmonary clinic 08/14/2015 by Dr Celene Skeen.   08/14/2015 1st [facility] office visit/ Wert   Chief Complaint  Patient presents with  . Pulmonary Consult    Referred by Dr. Charlesetta Shanks. Pt c/o SOB for the past 6 months. She gets SOB walking from room to room at home. She also c/o laryngitis- since 2008 she "loses vioce" approx 2 x per month. She also c/o non prod cough. She is using albuterol hfa approx 6 x per day.   already used albuterol 1 h before / no better p pred rx. Never sob unless also cough/hoarse.  Settles down at hs >>GERD tx , delsym/tramadol , pred taper    08/19/2015 Acute OV : cough /sob smoker  Pt presents for an acute office visit.  Complains of increased SOB, dry cough, hoariness, chest tightness/congestion x 2 weeks. Denies any sinus drainage/congestion, fever, nausea or vomiting.  Seen for consult last for dyspnea and hoarseness x 6 months.  She was started on GERD tx . W/ delsym Marcia Brash .  Says it has not helped at all .  Says she has lost her voice.  Cough is worse at night and in am.  Had cXR last month told it was normal. No records available.   Last ov Was given prednisone taper. Did not notice any improvement .  Denies fever, discolored mucus , orthopnea, chest pain, or hemoptysis.  Walk test in office with O2 sats 98-99% on RA .  Wt is up 45lbs over last year.  Pt continues to smoke, cessation discussed .  Last ov Spirometry was nml , NO low.    Current Medications, Allergies, Complete Past Medical History, Past Surgical  History, Family History, and Social History were reviewed in [employer] record.            Review of Systems   Constitutional:   No  weight loss, night sweats,  Fevers, chills +, fatigue, or  lassitude.  HEENT:   No headaches,  Difficulty swallowing,  Tooth/dental problems, or  Sore throat,                No sneezing, itching, ear ache,  +nasal congestion, post nasal drip,   CV:  No chest pain,  Orthopnea, PND, swelling in lower extremities, anasarca, dizziness, palpitations, syncope.   GI  No heartburn, indigestion, abdominal pain, nausea, vomiting, diarrhea, change in bowel habits, loss of appetite, bloody stools.   Resp:  No chest wall deformity  Skin: no rash or lesions.  GU: no dysuria, change in color of urine, no urgency or frequency.  No flank pain, no hematuria   MS:  No joint pain or swelling.  No decreased range of motion.  No back pain.  Psych:  No change in mood or affect. No depression or anxiety.  No memory loss.         Objective:   Physical Exam  amb bf   Filed Vitals:   08/19/15 1541  BP:  136/80  Pulse: 112  Temp: 98.4 F (36.9 C)  TempSrc: Oral  Height:  (1.6 m)  Weight: 220 lb (99.791 kg)  SpO2: 98%   Vital signs reviewed Hoarse  HEENT: nl dentition, turbinates, and oropharynx. Nl external ear canals without cough reflex   NECK :  without JVD/Nodes/TM/ nl carotid upstrokes bilaterally   LUNGS: no acc muscle use,  Nl contour chest which is clear to A and P bilaterally without cough on insp or exp maneuvers   CV:  RRR  no s3 or murmur or increase in P2, no edema   ABD:  soft and nontender with nl inspiratory excursion in the supine position. No bruits or organomegaly, bowel sounds nl  MS:  Nl gait/ ext warm without deformities, calf tenderness, cyanosis or clubbing No obvious joint restrictions   SKIN: warm and dry without lesions    NEURO:  alert, approp, nl sensorium with  no motor deficits    cxr  done 2 d prior to OV  Per pt ok at Dr Malen Gauze office       Assessment & Plan:

## 2015-08-19 NOTE — Assessment & Plan Note (Signed)
DOE ? Etiology -spirometry normal , cxr reported as ok .  Check D Dimer (on BCP , smoker and obese )  If positive check CTA chest

## 2015-08-19 NOTE — Patient Instructions (Addendum)
Add Zyrtec  At bedtime   Continue on Delsym 2 tsp Twice daily  For cough.  Labs today .  Refer to ENT for recurrent hoarseness.  Continue on Protonix and Pepcid.  Follow up Dr. Sherene Sires  In 3-4 weeks and As needed   Please contact office for sooner follow up if symptoms do not improve or worsen or seek emergency care

## 2015-08-20 ENCOUNTER — Telehealth: Payer: Self-pay | Admitting: Internal Medicine

## 2015-08-20 ENCOUNTER — Ambulatory Visit (INDEPENDENT_AMBULATORY_CARE_PROVIDER_SITE_OTHER)
Admission: RE | Admit: 2015-08-20 | Discharge: 2015-08-20 | Disposition: A | Discharge status: Home / Self Care | Payer: No Typology Code available for payment source | Source: Ambulatory Visit | Attending: Adult Health | Admitting: Adult Health

## 2015-08-20 DIAGNOSIS — R0600 Dyspnea, unspecified: Secondary | ICD-10-CM

## 2015-08-20 DIAGNOSIS — R7989 Other specified abnormal findings of blood chemistry: Secondary | ICD-10-CM

## 2015-08-20 DIAGNOSIS — R791 Abnormal coagulation profile: Secondary | ICD-10-CM

## 2015-08-20 LAB — D-DIMER, QUANTITATIVE: D-Dimer, Quant: 0.76 ug/mL-FEU — ABNORMAL HIGH (ref 0.00–0.48)

## 2015-08-20 MED ORDER — IOHEXOL 350 MG/ML SOLN
80.0000 mL | Freq: Once | INTRAVENOUS | Status: AC | PRN
  Administered 2015-08-20: 80 mL via INTRAVENOUS

## 2015-08-20 NOTE — Telephone Encounter (Signed)
Result Note     D Dimer is positive.     Recommend set up for a CTA Chest PE protocol to r/o PE .     Please contact office for sooner follow up if symptoms do not improve or worsen or seek emergency care        ---  I spoke with patient about results and she verbalized understanding and had no questions. Order has been placed. Nothing further needed

## 2015-08-20 NOTE — Telephone Encounter (Signed)
Pt calling for CT results Aware that we will call her once the results are reviewed.  Please advise TP. Thanks.

## 2015-08-20 NOTE — Progress Notes (Signed)
Quick Note:  LVM for pt to return call ______ 

## 2015-08-21 NOTE — Telephone Encounter (Signed)
See CT results  Thanks

## 2015-08-21 NOTE — Progress Notes (Signed)
Quick Note:  Spoke wit pt. Discussed CT Chest results and recs per TP. Pt verbalized understanding and voiced no further questions or concerns at this time. ______

## 2015-08-21 NOTE — Telephone Encounter (Signed)
Patient calling again requesting results from CT, CB is [phone].

## 2015-08-21 NOTE — Telephone Encounter (Signed)
Pt is calling requesting her CT results. Please advise TP thanks

## 2015-08-21 NOTE — Telephone Encounter (Signed)
Result Notes     Notes Recorded by Julio Sicks, NP on 08/21/2015 at 1:54 PM CT Chest is normal  No blood clot.  Cont w/ ov recs Please contact office for sooner follow up if symptoms do not improve or worsen or seek emergency care    --------  Called, spoke with pt.  Discussed above CT Chest results and recs per TP.  Pt verbalized understanding and voiced no further questions or concerns at this time.

## 2015-09-08 ENCOUNTER — Other Ambulatory Visit (INDEPENDENT_AMBULATORY_CARE_PROVIDER_SITE_OTHER): Payer: No Typology Code available for payment source

## 2015-09-08 ENCOUNTER — Ambulatory Visit (INDEPENDENT_AMBULATORY_CARE_PROVIDER_SITE_OTHER): Payer: No Typology Code available for payment source | Admitting: Internal Medicine

## 2015-09-08 ENCOUNTER — Encounter: Payer: Self-pay | Admitting: Internal Medicine

## 2015-09-08 VITALS — BP 112/80 | HR 107 | Ht 64.0 in | Wt 219.8 lb

## 2015-09-08 DIAGNOSIS — R058 Other specified cough: Secondary | ICD-10-CM

## 2015-09-08 DIAGNOSIS — R05 Cough: Secondary | ICD-10-CM

## 2015-09-08 DIAGNOSIS — Z720 Tobacco use: Secondary | ICD-10-CM

## 2015-09-08 DIAGNOSIS — F17210 Nicotine dependence, cigarettes, uncomplicated: Secondary | ICD-10-CM | POA: Insufficient documentation

## 2015-09-08 LAB — CBC WITH DIFFERENTIAL/PLATELET
BASOS PCT: 0.5 % (ref 0.0–3.0)
Basophils Absolute: 0 10*3/uL (ref 0.0–0.1)
EOS PCT: 1.5 % (ref 0.0–5.0)
Eosinophils Absolute: 0.1 10*3/uL (ref 0.0–0.7)
HEMATOCRIT: 41.1 % (ref 36.0–46.0)
HEMOGLOBIN: 14 g/dL (ref 12.0–15.0)
LYMPHS PCT: 36.2 % (ref 12.0–46.0)
Lymphs Abs: 2.7 10*3/uL (ref 0.7–4.0)
MCHC: 34 g/dL (ref 30.0–36.0)
MCV: 96.8 fl (ref 78.0–100.0)
MONO ABS: 0.8 10*3/uL (ref 0.1–1.0)
MONOS PCT: 10.2 % (ref 3.0–12.0)
Neutro Abs: 3.8 10*3/uL (ref 1.4–7.7)
Neutrophils Relative %: 51.6 % (ref 43.0–77.0)
Platelets: 350 10*3/uL (ref 150.0–400.0)
RBC: 4.24 Mil/uL (ref 3.87–5.11)
RDW: 13 % (ref 11.5–15.5)
WBC: 7.4 10*3/uL (ref 4.0–10.5)

## 2015-09-08 MED ORDER — TRAMADOL HCL 50 MG PO TABS: ORAL_TABLET | ORAL | Status: DC

## 2015-09-08 NOTE — Assessment & Plan Note (Signed)

## 2015-09-08 NOTE — Assessment & Plan Note (Signed)
Spirometry 08/14/2015  wnl NO 08/14/2015  = 6  - Allergy profile 09/08/2015 >  Eos 0. /  IgE  Pending   I had an extended discussion with the patient reviewing all relevant studies completed to date and  lasting 15 to 20 minutes of a 25 minute visit  Explained the natural history of cyclical and why it's necessary in patients at risk to treat GERD aggressively(esp since on bcps)  - at least  short term -   to reduce risk of evolving cyclical cough initially  triggered by epithelial injury and a heightened sensitivty to the effects of any upper airway irritants,  most importantly acid - related - then perpetuated by epithelial injury related to the cough itself as the upper airway collapses on itself.  That is, the more sensitive the epithelium becomes once it is damaged by the virus, the more the ensuing irritability> the more the cough, the more the secondary reflux (especially in those prone to reflux) the more the irritation of the sensitive mucosa and so on in a  Classic cyclical pattern.      Each maintenance medication was reviewed in detail including most importantly the difference between maintenance and prns and under what circumstances the prns are to be triggered using an action plan format that is not reflected in the computer generated alphabetically organized AVS.    Please see instructions for details which were reviewed in writing and the patient given a copy highlighting the part that I personally wrote and discussed at today's ov.

## 2015-09-08 NOTE — Progress Notes (Signed)
Subjective:    Patient ID: Jessica Knapp, female    DOB: 1991/03/01   MRN: [id number]     Brief patient profile:  24 yobm active smoker with variable loss of voice age 25 about the time she started HS resolved on its own w/in 2 weeks did not affect ability to play BB competitively then around  Age 31 would not resolve p 2 weeks requiring pred about every other month between  Then  late summer 2016 noted sob and hoarse that didn't improve even p prednisone  so referred to pulmonary clinic 08/14/2015 by Dr Celene Skeen.    History of Present Illness  08/14/2015 1st [facility] office visit/ Kamuela Magos   Chief Complaint  Patient presents with  . Pulmonary Consult    Referred by Dr. Charlesetta Shanks. Pt c/o SOB for the past 6 months. She gets SOB walking from room to room at home. She also c/o laryngitis- since 2008 she "loses vioce" approx 2 x per month. She also c/o non prod cough. She is using albuterol hfa approx 6 x per day.   already used albuterol 1 h before / no better p pred rx. Never sob unless also cough/hoarse.  Settles down at hs rec First take delsym two tsp every 12 hours and supplement if needed with  tramadol 50 mg up to 1-2 every 4 hours  Once you have eliminated the cough for 3 straight days try reducing the tramadol first,  then the delsym as tolerated.   Protonix (pantoprazole) Take 30-60 min before first meal of the day and Pepcid 20 mg one hour before bedtime plus Chlorpheniramine 4 mg x 2 at bedtime (both available over the counter)  Until no coughing off all cough suppression GERD diet     08/18/14 NP eval Add Zyrtec  At bedtime   Continue on Delsym 2 tsp Twice daily  For cough.  Labs today .  Refer to ENT for recurrent hoarseness.  Continue on Protonix and Pepcid.  CT chest 08/20/15  Negative for pulmonary embolus. Negative chest CT/ no findings    09/08/2015  f/u ov/Jdyn Parkerson re: cough since Summer 2016  Chief Complaint  Patient presents with  . Follow-up    Cough is  unchanged. She has had sore throat off and on. She is using albuterol inhaler 2 x daily on average.   never took  the h1 or used the tramadol at rec doses/ noct cough some better p rx with zyrtec  No obvious day to day or daytime variability or assoc excess/ purulent sputum or mucus plugs or hemoptysis or cp or chest tightness, subjective wheeze or overt sinus or hb symptoms. No unusual exp hx or h/o childhood pna/ asthma or knowledge of premature birth.  Sleeping ok without nocturnal  or early am exacerbation  of respiratory  c/o's or need for noct saba. Also denies any obvious fluctuation of symptoms with weather or environmental changes or other aggravating or alleviating factors except as outlined above   Current Medications, Allergies, Complete Past Medical History, Past Surgical History, Family History, and Social History were reviewed in [employer] record.  ROS  The following are not active complaints unless bolded sore throat, dysphagia, dental problems, itching, sneezing,  nasal congestion or excess/ purulent secretions, ear ache,   fever, chills, sweats, unintended wt loss, classically pleuritic or exertional cp,  orthopnea pnd or leg swelling, presyncope, palpitations, abdominal pain, anorexia, nausea, vomiting, diarrhea  or change in bowel or bladder habits, change  in stools or urine, dysuria,hematuria,  rash, arthralgias, visual complaints, headache, numbness, weakness or ataxia or problems with walking or coordination,  change in mood/affect or memory.                                 Objective:   Physical Exam  amb bf extremely hoarse/   with ? Belle affect    Wt Readings from Last 3 Encounters:  09/08/15 219 lb 12.8 oz (99.701 kg)  08/19/15 220 lb (99.791 kg)  08/14/15 213 lb 12.8 oz (96.979 kg)    Vital signs reviewed      HEENT: nl dentition, turbinates, and oropharynx. Nl external ear canals without cough reflex   NECK :  without  JVD/Nodes/TM/ nl carotid upstrokes bilaterally   LUNGS: no acc muscle use,  Nl contour chest which is clear to A and P bilaterally without cough on insp or exp maneuvers   CV:  RRR  no s3 or murmur or increase in P2, no edema   ABD:  soft and nontender with nl inspiratory excursion in the supine position. No bruits or organomegaly, bowel sounds nl  MS:  Nl gait/ ext warm without deformities, calf tenderness, cyanosis or clubbing No obvious joint restrictions   SKIN: warm and dry without lesions    NEURO:  alert, approp, nl sensorium with  no motor deficits          Assessment & Plan:

## 2015-09-08 NOTE — Patient Instructions (Addendum)
Go ahead and take the prednisone you already have left    For drainage / throat tickle try take CHLORPHENIRAMINE  4 mg - take one every 4 hours as needed - available over the counter- may cause drowsiness so start with just a bedtime x two pills  and see how you tolerate it before trying in daytime    Take delsym two tsp every 12 hours and supplement if needed with  tramadol 50 mg up to 2 every 4 hours to suppress the urge to cough. Swallowing water or using ice chips/non mint and menthol containing candies (such as lifesavers or sugarless jolly ranchers) are also effective.  You should rest your voice and avoid activities that you know make you cough.  Once you have eliminated the cough for 3 straight days try reducing the tramadol first,  then the delsym as tolerated.    Only use your albuterol as a rescue medication to be used if you can't catch your breath by resting or doing a relaxed purse lip breathing pattern.  - The less you use it, the better it will work when you need it. - Ok to use up to 2 puffs  every 4 hours if you must but call for immediate appointment if use goes up over your usual need - Don't leave home without it !!  (think of it like the spare tire for your car)   The key is to stop smoking completely before smoking completely stops you!   If your ENT eval is not helpful, call for new prescription called gabapentin that you can start taking  before returning for follow up with all active medications in hand

## 2015-09-09 LAB — RESPIRATORY ALLERGY PROFILE REGION II ~~LOC~~
Allergen, Cedar tree, t12: <0.1 kU/L
Allergen, Comm Silver Birch, t9: <0.1 kU/L
Allergen, Mouse Urine Protein, e78: <0.1 kU/L
Alternaria Alternata: <0.1 kU/L
Aspergillus fumigatus, m3: <0.1 kU/L
Common Ragweed: <0.1 kU/L
IgE (Immunoglobulin E), Serum: <2 kU/L (ref <115)
Penicillium Notatum: <0.1 kU/L
Rough Pigweed  IgE: <0.1 kU/L
Timothy Grass: <0.1 kU/L

## 2015-09-10 NOTE — Progress Notes (Signed)
Quick Note:  Called and spoke with pt. Reviewed results and recs. Pt voiced understanding and had no further questions. ______ 

## 2015-09-15 ENCOUNTER — Other Ambulatory Visit: Payer: Self-pay | Admitting: Internal Medicine

## 2016-04-07 ENCOUNTER — Encounter (HOSPITAL_COMMUNITY): Payer: Self-pay | Admitting: Family Medicine

## 2016-04-07 ENCOUNTER — Ambulatory Visit (HOSPITAL_COMMUNITY)
Admission: EM | Admit: 2016-04-07 | Discharge: 2016-04-07 | Disposition: A | Discharge status: Home / Self Care | Payer: No Typology Code available for payment source | Attending: Internal Medicine | Admitting: Internal Medicine

## 2016-04-07 DIAGNOSIS — G43009 Migraine without aura, not intractable, without status migrainosus: Secondary | ICD-10-CM | POA: Diagnosis not present

## 2016-04-07 MED ORDER — DEXAMETHASONE SODIUM PHOSPHATE 10 MG/ML IJ SOLN
INTRAMUSCULAR | Status: AC
  Filled 2016-04-07: qty 1

## 2016-04-07 MED ORDER — AMITRIPTYLINE HCL 50 MG PO TABS: 50.0000 mg | ORAL_TABLET | Freq: Every day | ORAL | 0 refills | Status: DC

## 2016-04-07 MED ORDER — METOCLOPRAMIDE HCL 5 MG/ML IJ SOLN
INTRAMUSCULAR | Status: AC
  Filled 2016-04-07: qty 2

## 2016-04-07 MED ORDER — METOCLOPRAMIDE HCL 5 MG/ML IJ SOLN
10.0000 mg | Freq: Once | INTRAMUSCULAR | Status: AC
  Administered 2016-04-07: 10 mg via INTRAMUSCULAR

## 2016-04-07 MED ORDER — KETOROLAC TROMETHAMINE 60 MG/2ML IM SOLN
INTRAMUSCULAR | Status: AC
  Filled 2016-04-07: qty 2

## 2016-04-07 MED ORDER — KETOROLAC TROMETHAMINE 60 MG/2ML IM SOLN
60.0000 mg | Freq: Once | INTRAMUSCULAR | Status: AC
  Administered 2016-04-07: 60 mg via INTRAMUSCULAR

## 2016-04-07 MED ORDER — DEXAMETHASONE SODIUM PHOSPHATE 10 MG/ML IJ SOLN
10.0000 mg | Freq: Once | INTRAMUSCULAR | Status: AC
  Administered 2016-04-07: 10 mg via INTRAMUSCULAR

## 2016-04-07 NOTE — ED Provider Notes (Signed)
CSN: [id number]     Arrival date & time 04/07/16  1406 History   First MD Initiated Contact with Patient 04/07/16 1619     Chief Complaint  Patient presents with  . Migraine   (Consider location/radiation/quality/duration/timing/severity/associated sxs/prior Treatment) Ms. Keast is a well-appearing 25 y.o female with chronic migraine, last saw her neurologist 7 months ago, presents today for migraine headache. She reports to have mild headache/migraine daily but gets bad once weekly and gets very severe once every 2-3 months. She reports headache 10/10 unrelieved by Rizatriptan and associated with nausea, dizziness, blurry vision, and photosensitivity, reports typical for her migraine. She was taking amitriptyline for migraine prophylaxis with no relief. She reports to have tried several other migraine prophylaxis options with no success as well.       Past Medical History:  Diagnosis Date  . Chronic headaches    Past Surgical History:  Procedure Laterality Date  . FINGER SURGERY    . HAND SURGERY     Family History  Problem Relation Age of Onset  . Asthma Maternal Grandfather   . Asthma Brother   . Heart disease Maternal Grandmother    Social History  Substance Use Topics  . Smoking status: Current Some Day Smoker    Packs/day: 0.03    Years: 1.00    Types: Cigarettes  . Smokeless tobacco: Never Used  . Alcohol use No   OB History    No data available     Review of Systems  Constitutional: Positive for fatigue. Negative for chills and fever.  HENT:       Negative   Eyes: Positive for photophobia and visual disturbance. Negative for pain and redness.  Respiratory: Negative for cough and shortness of breath.   Cardiovascular: Negative for chest pain and palpitations.  Gastrointestinal: Positive for nausea. Negative for abdominal pain and vomiting.  Neurological: Positive for dizziness and headaches.    Allergies  Review of patient's allergies indicates no known  allergies.  Home Medications   Prior to Admission medications   Medication Sig Start Date End Date Taking? Authorizing Provider  albuterol (VENTOLIN HFA) 108 (90 Base) MCG/ACT inhaler Inhale 2 puffs into the lungs every 4 (four) hours as needed for wheezing or shortness of breath.    Historical Provider, MD  amitriptyline (ELAVIL) 50 MG tablet Take 1 tablet (50 mg total) by mouth at bedtime. 04/07/16 05/07/16  Lucia Estelle, NP  APRI 0.15-30 MG-MCG tablet Take 1 tablet by mouth daily. 07/15/15   Historical Provider, MD  famotidine (PEPCID) 20 MG tablet TAKE 1 TABLET BY MOUTH AT BEDTIME 09/15/15   Nyoka Cowden, MD  oxaprozin (DAYPRO) 600 MG tablet Take 600 mg by mouth as directed.    Historical Provider, MD  pantoprazole (PROTONIX) 40 MG tablet TAKE 1 TABLET BY MOUTH EVERY DAY (TAKE 30 TO 60 MIN BEFORE FIRST MAIN MEAL OF THE DAY) 09/15/15   Nyoka Cowden, MD  rizatriptan (MAXALT) 10 MG tablet Take 10 mg by mouth as needed for migraine. May repeat in 2 hours if needed    Historical Provider, MD  traMADol Janean Sark) 50 MG tablet 1-2 every 4 hours as needed for cough or pain 09/08/15   Nyoka Cowden, MD   Meds Ordered and Administered this Visit   Medications  ketorolac (TORADOL) injection 60 mg (not administered)  metoCLOPramide (REGLAN) injection 10 mg (not administered)  dexamethasone (DECADRON) injection 10 mg (not administered)    BP 135/79   Pulse 87  Temp 98.6 F (37 C)   Resp 16   SpO2 98%  No data found.   Physical Exam  Constitutional: She is oriented to person, place, and time. She appears well-developed and well-nourished.  HENT:  Head: Normocephalic and atraumatic.  Right Ear: External ear normal.  Left Ear: External ear normal.  Nose: Nose normal.  Mouth/Throat: Oropharynx is clear and moist.  Eyes: EOM are normal. Pupils are equal, round, and reactive to light.  Neck: Normal range of motion. Neck supple.  Cardiovascular: Normal rate, regular rhythm and normal heart  sounds.   Pulmonary/Chest: Effort normal and breath sounds normal. No respiratory distress. She has no wheezes.  Abdominal: Soft. Bowel sounds are normal. She exhibits no distension. There is no tenderness.  Neurological: She is alert and oriented to person, place, and time. No cranial nerve deficit. Coordination normal.  Skin: Skin is warm and dry.  Psychiatric: She has a normal mood and affect.  Nursing note and vitals reviewed.   Urgent Care Course   Clinical Course    Procedures (including critical care time)  Labs Review Labs Reviewed - No data to display  Imaging Review No results found.   MDM   1. Migraine without aura and without status migrainosus, not intractable    Physical examination normal and patient is neurologically intact. Migraine cocktail consists of Decadron, Toradol, and Reglan given. And patient send home with RX given for Amitriptyline 50mg  for migraine prophylaxis. Patient reports that the 25mg  Amitriptyline is not working and is willing to try the 50mg  to see if the higher dose will work. Patient instructed to f/u with her neurologist or her PCP in 2-4 weeks for re-evaluation and follow up.     Lucia EstelleFeng Jalecia Leon, NP 04/07/16 1655

## 2016-04-07 NOTE — ED Triage Notes (Signed)
Pt here for migraine x 2 days. sts also her BP has been up.

## 2016-04-11 IMAGING — CT CT ANGIO CHEST
2 of 7 series · 19 of 46 positions shown · IV contrast (omnipaque)
Comparison: PA and lateral chest 08/12/2015.

CLINICAL DATA: Shortness of breath with exertion, hoarseness and
sore throat for 3 weeks. Initial encounter.

EXAM:
CT ANGIOGRAPHY CHEST WITH CONTRAST
TECHNIQUE: Multidetector CT imaging of the chest was performed using the
standard protocol during bolus administration of intravenous
contrast. Multiplanar CT image reconstructions and MIPs were
obtained to evaluate the vascular anatomy.
CONTRAST:  80 mL OMNIPAQUE IOHEXOL 350 MG/ML SOLN

[Series 5: thins · axial · 0.60mm/px · z∈[-248,-32]mm · 16 of 238 slices shown]
[im 11/238  lung]
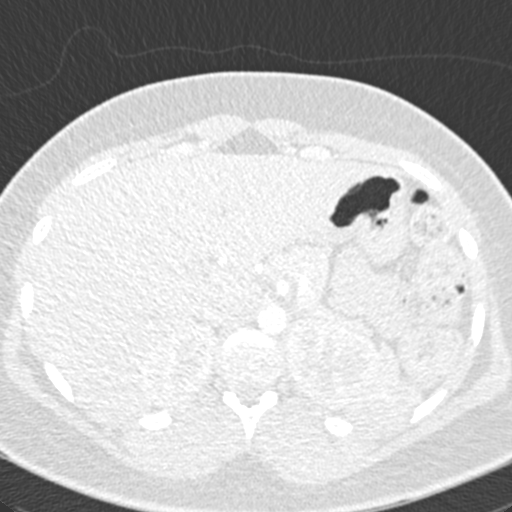
[im 31/238  soft-tissue]
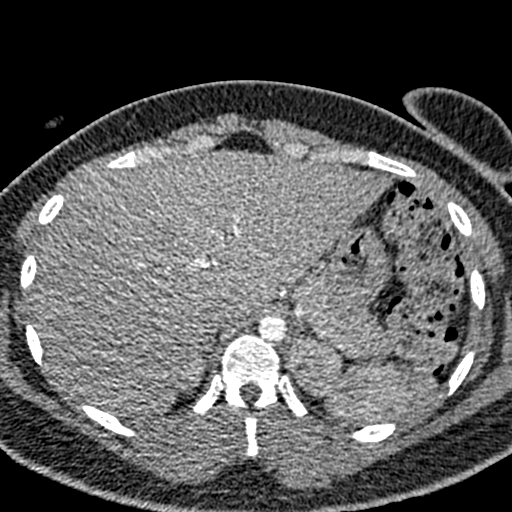
[im 42/238  lung]
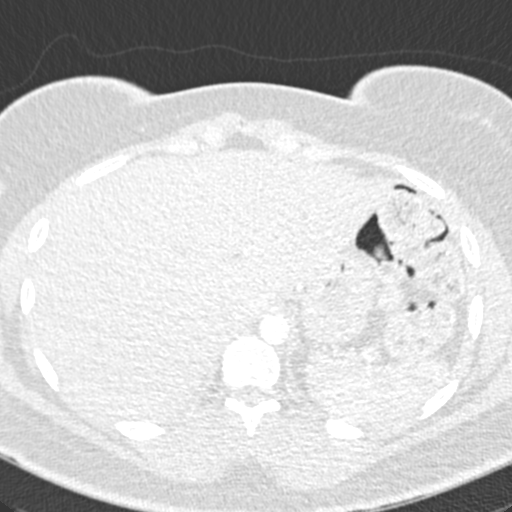
[im 52/238  soft-tissue]
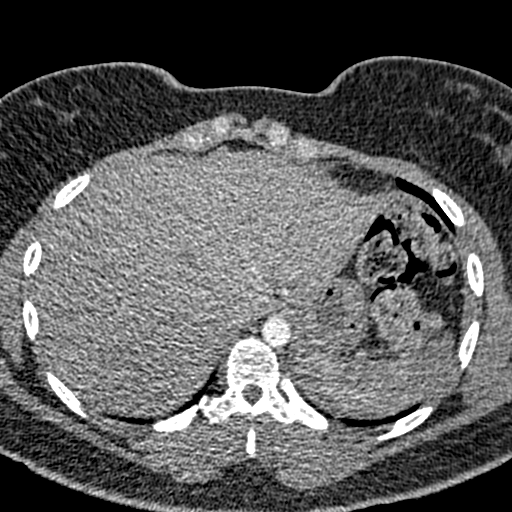
[im 73/238  lung]
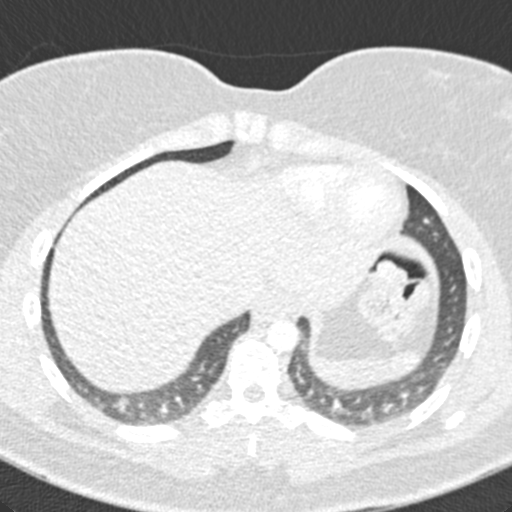
[im 83/238  soft-tissue]
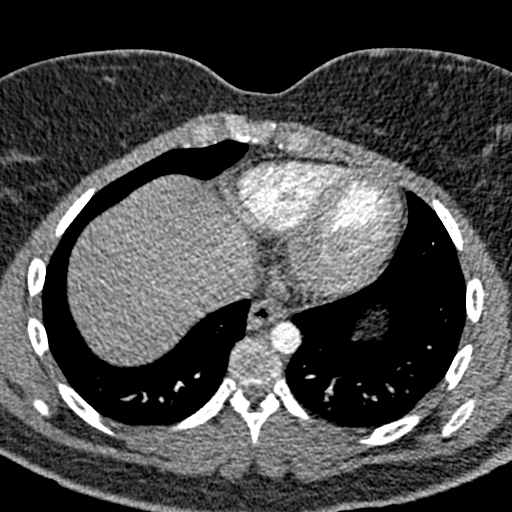
[im 93/238  lung]
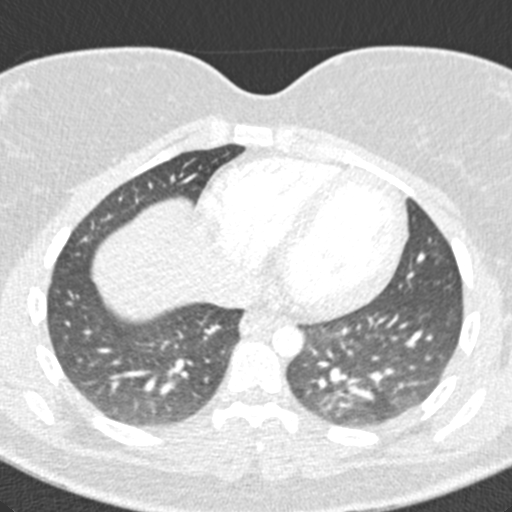
[im 114/238  soft-tissue]
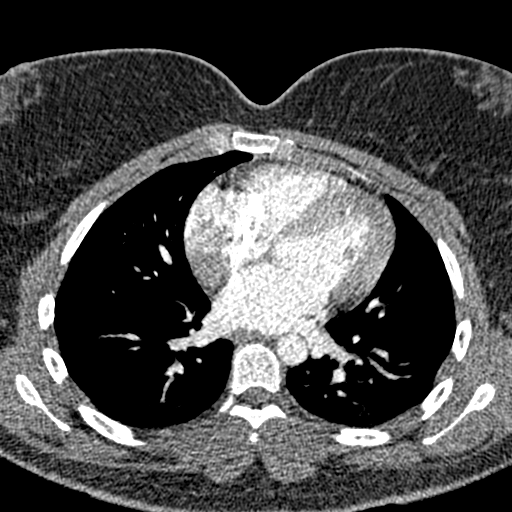
[im 124/238  lung]
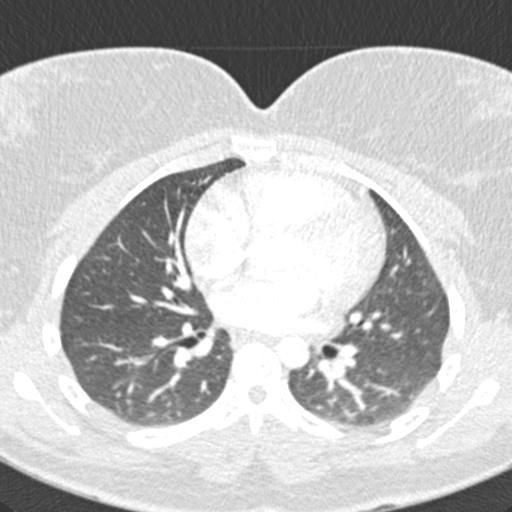
[im 145/238  soft-tissue]
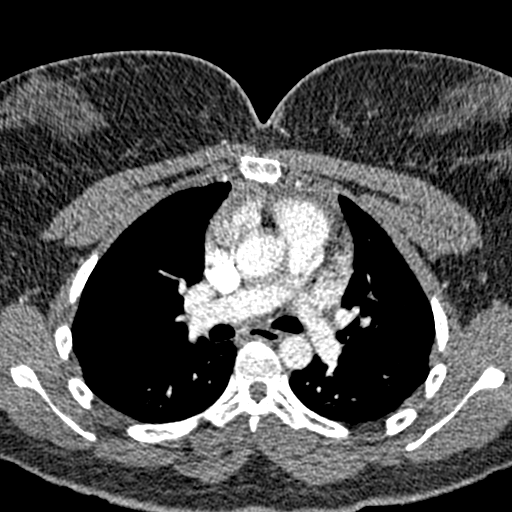
[im 155/238  lung]
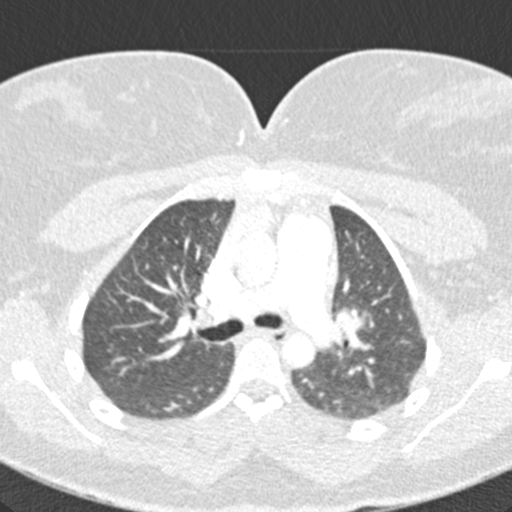
[im 165/238  soft-tissue]
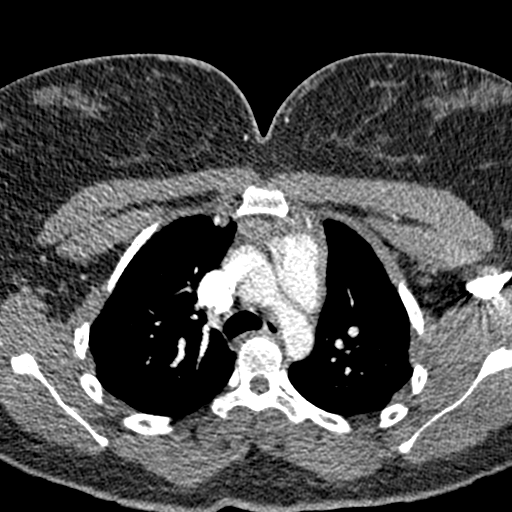
[im 186/238  lung]
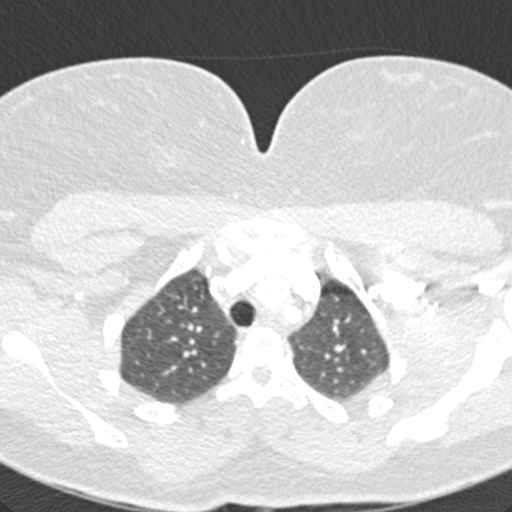
[im 196/238  soft-tissue]
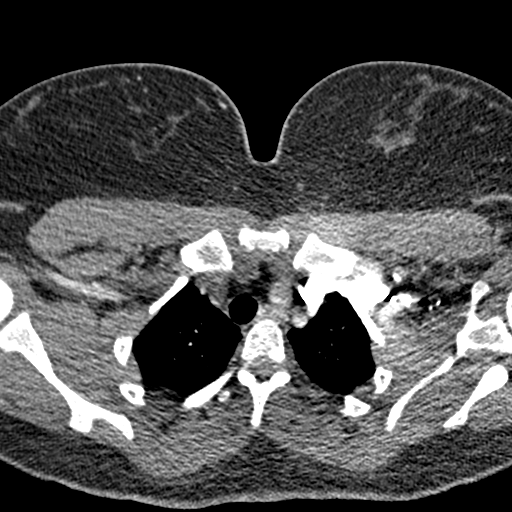
[im 207/238  lung]
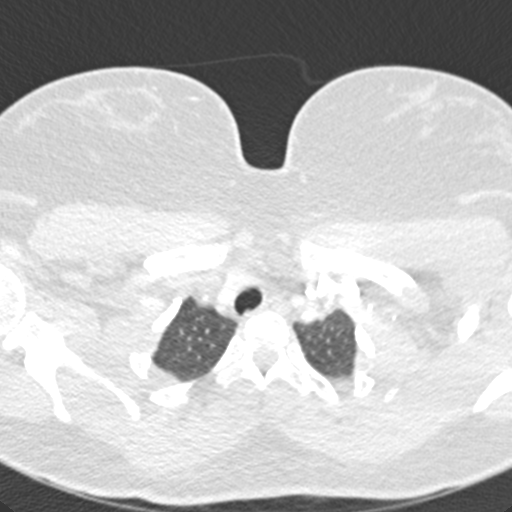
[im 227/238  soft-tissue]
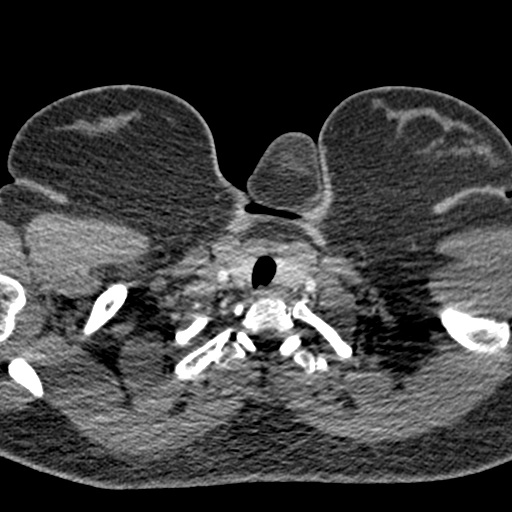

[Series 7: coronal mpr · coronal · 0.59mm/px · 3 of 117 slices shown]
[im 30/117  soft-tissue]
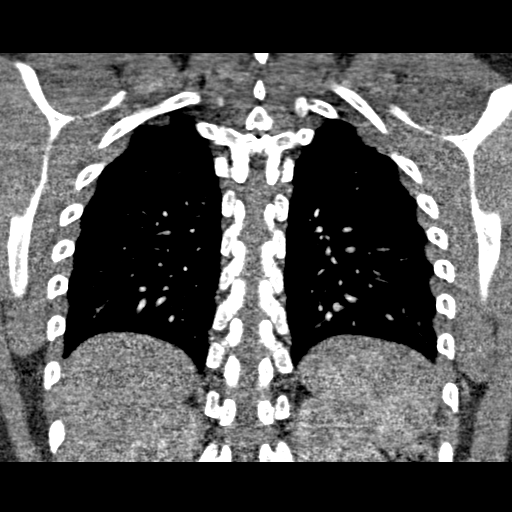
[im 59/117  soft-tissue]
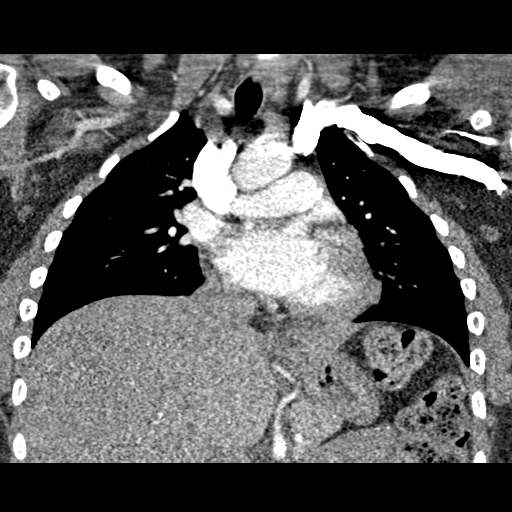
[im 88/117  soft-tissue]
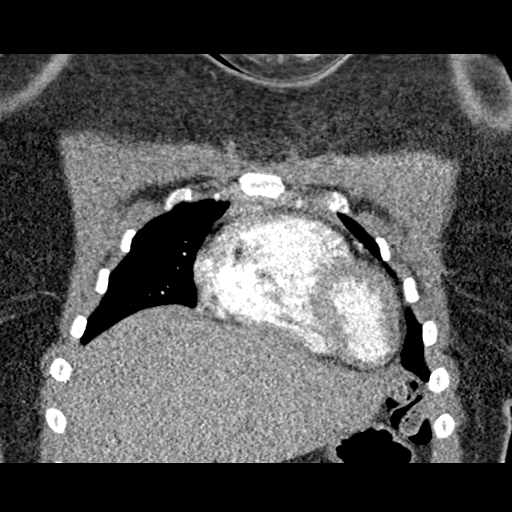

[19 of 46 positions shown; findings below may reference images not displayed]

FINDINGS: No pulmonary embolus is identified. Heart size is normal. No pleural
or pericardial effusion. No axillary, hilar or mediastinal
lymphadenopathy. The lungs are clear. Airways are unremarkable.
Incidentally imaged upper abdomen appears normal. No bony
abnormality is identified.

Review of the MIP images confirms the above findings.
IMPRESSION: Negative for pulmonary embolus.  Negative chest CT.

## 2016-06-29 ENCOUNTER — Emergency Department (HOSPITAL_COMMUNITY)
Admission: EM | Admit: 2016-06-29 | Discharge: 2016-06-29 | Disposition: A | Discharge status: Home / Self Care | Payer: PRIVATE HEALTH INSURANCE | Attending: Emergency Medicine | Admitting: Emergency Medicine

## 2016-06-29 ENCOUNTER — Encounter (HOSPITAL_COMMUNITY): Payer: Self-pay | Admitting: Emergency Medicine

## 2016-06-29 ENCOUNTER — Emergency Department (HOSPITAL_COMMUNITY): Payer: PRIVATE HEALTH INSURANCE

## 2016-06-29 DIAGNOSIS — K219 Gastro-esophageal reflux disease without esophagitis: Secondary | ICD-10-CM | POA: Diagnosis not present

## 2016-06-29 DIAGNOSIS — F17210 Nicotine dependence, cigarettes, uncomplicated: Secondary | ICD-10-CM | POA: Insufficient documentation

## 2016-06-29 DIAGNOSIS — K2960 Other gastritis without bleeding: Secondary | ICD-10-CM

## 2016-06-29 DIAGNOSIS — J029 Acute pharyngitis, unspecified: Secondary | ICD-10-CM | POA: Insufficient documentation

## 2016-06-29 LAB — RAPID STREP SCREEN (MED CTR MEBANE ONLY): STREPTOCOCCUS, GROUP A SCREEN (DIRECT): NEGATIVE

## 2016-06-29 LAB — POC URINE PREG, ED: PREG TEST UR: NEGATIVE

## 2016-06-29 MED ORDER — ONDANSETRON 4 MG PO TBDP
4.0000 mg | ORAL_TABLET | Freq: Once | ORAL | Status: AC
  Administered 2016-06-29: 4 mg via ORAL
  Filled 2016-06-29: qty 1

## 2016-06-29 MED ORDER — ONDANSETRON 4 MG PO TBDP: 4.0000 mg | ORAL_TABLET | Freq: Three times a day (TID) | ORAL | 0 refills | Status: DC | PRN

## 2016-06-29 MED ORDER — ACETAMINOPHEN 500 MG PO TABS
1000.0000 mg | ORAL_TABLET | Freq: Once | ORAL | Status: AC
  Administered 2016-06-29: 1000 mg via ORAL
  Filled 2016-06-29: qty 2

## 2016-06-29 MED ORDER — GI COCKTAIL ~~LOC~~
30.0000 mL | Freq: Once | ORAL | Status: AC
  Administered 2016-06-29: 30 mL via ORAL
  Filled 2016-06-29: qty 30

## 2016-06-29 NOTE — ED Triage Notes (Signed)
Per pt, states lost voice, cold symptoms, sore throat and chest burning since yesterday

## 2016-06-29 NOTE — ED Notes (Signed)
Patient transported to X-ray 

## 2016-06-29 NOTE — ED Provider Notes (Signed)
[facility]-EMERGENCY DEPT Provider Note   CSN: [id number] Arrival date & time: 06/29/16  0753     History   Chief Complaint Chief Complaint  Patient presents with  . Sore Throat    HPI Jessica Knapp is a 26 y.o. female.  The history is provided by the patient. No language interpreter was used.  Sore Throat    Jessica Knapp is a 26 y.o. female who presents to the Emergency Department complaining of sore throat.  She comes in for sore throat, chest burning, vomiting. She reports 2-3 days of nausea and vomiting, 2-3 episodes daily. Yesterday she developed burning in her chest and burning in her throat. This morning she woke up and she had lost her voice. She denies any fevers, abdominal pain, diarrhea, dysuria. She does have mild shortness of breath. She can swallow but there is pain with swallowing. Past Medical History:  Diagnosis Date  . Chronic headaches     Patient Active Problem List   Diagnosis Date Noted  . Cigarette smoker 09/08/2015  . Dyspnea 08/19/2015  . Upper airway cough syndrome 08/14/2015    Past Surgical History:  Procedure Laterality Date  . FINGER SURGERY    . HAND SURGERY      OB History    No data available       Home Medications    Prior to Admission medications   Medication Sig Start Date End Date Taking? Authorizing Provider  albuterol (VENTOLIN HFA) 108 (90 Base) MCG/ACT inhaler Inhale 2 puffs into the lungs every 4 (four) hours as needed for wheezing or shortness of breath.    Historical Provider, MD  amitriptyline (ELAVIL) 50 MG tablet Take 1 tablet (50 mg total) by mouth at bedtime. 04/07/16 05/07/16  Lucia Estelle, NP  APRI 0.15-30 MG-MCG tablet Take 1 tablet by mouth daily. 07/15/15   Historical Provider, MD  famotidine (PEPCID) 20 MG tablet TAKE 1 TABLET BY MOUTH AT BEDTIME 09/15/15   Nyoka Cowden, MD  ondansetron (ZOFRAN ODT) 4 MG disintegrating tablet Take 1 tablet (4 mg total) by mouth every 8 (eight) hours as needed for nausea or vomiting.  06/29/16   Tilden Fossa, MD  oxaprozin (DAYPRO) 600 MG tablet Take 600 mg by mouth as directed.    Historical Provider, MD  pantoprazole (PROTONIX) 40 MG tablet TAKE 1 TABLET BY MOUTH EVERY DAY (TAKE 30 TO 60 MIN BEFORE FIRST MAIN MEAL OF THE DAY) 09/15/15   Nyoka Cowden, MD  rizatriptan (MAXALT) 10 MG tablet Take 10 mg by mouth as needed for migraine. May repeat in 2 hours if needed    Historical Provider, MD  traMADol Janean Sark) 50 MG tablet 1-2 every 4 hours as needed for cough or pain 09/08/15   Nyoka Cowden, MD    Family History Family History  Problem Relation Age of Onset  . Asthma Maternal Grandfather   . Asthma Brother   . Heart disease Maternal Grandmother     Social History Social History  Substance Use Topics  . Smoking status: Current Some Day Smoker    Packs/day: 0.03    Years: 1.00    Types: Cigarettes  . Smokeless tobacco: Never Used  . Alcohol use No     Allergies   Patient has no known allergies.   Review of Systems Review of Systems  All other systems reviewed and are negative.    Physical Exam Updated Vital Signs BP 130/88   Pulse 83   Temp 98.8 F (37.1 C) (Oral)  Resp 16   LMP 05/28/2016   SpO2 100%   Physical Exam  Constitutional: She is oriented to person, place, and time. She appears well-developed and well-nourished.  HENT:  Head: Normocephalic and atraumatic.  Mild erythema and posterior oropharynx without any edema or exudate.  Neck: Neck supple.  Cardiovascular: Regular rhythm.   No murmur heard. tachycardic  Pulmonary/Chest: Effort normal and breath sounds normal. No stridor. No respiratory distress.  Abdominal: Soft. There is no tenderness. There is no rebound and no guarding.  Musculoskeletal: She exhibits no edema or tenderness.  Neurological: She is alert and oriented to person, place, and time.  Skin: Skin is warm and dry.  Psychiatric: She has a normal mood and affect. Her behavior is normal.  Nursing note and vitals  reviewed.    ED Treatments / Results  Labs (all labs ordered are listed, but only abnormal results are displayed) Labs Reviewed  RAPID STREP SCREEN (NOT AT [facility])  CULTURE, GROUP A STREP [facility])  POC URINE PREG, ED    EKG  EKG Interpretation None       Radiology Dg Chest 2 View  Result Date: 06/29/2016 CLINICAL DATA:  Chest pain and cough EXAM: CHEST  2 VIEW COMPARISON:  August 12, 2015 chest radiograph and August 20, 2015 chest CT FINDINGS: Lungs are clear. Heart size and pulmonary vascularity are normal. No adenopathy. No pneumothorax. No bone lesions. IMPRESSION: No edema or consolidation. Electronically Signed   By: Bretta BangWilliam  Woodruff III M.D.   On: 06/29/2016 08:26    Procedures Procedures (including critical care time)  Medications Ordered in ED Medications  ondansetron (ZOFRAN-ODT) disintegrating tablet 4 mg (4 mg Oral Given 06/29/16 0939)  gi cocktail (Maalox,Lidocaine,Donnatal) (30 mLs Oral Given 06/29/16 0939)  acetaminophen (TYLENOL) tablet 1,000 mg (1,000 mg Oral Given 06/29/16 1026)     Initial Impression / Assessment and Plan / ED Course  I have reviewed the triage vital signs and the nursing notes.  Pertinent labs & imaging results that were available during my care of the patient were reviewed by me and considered in my medical decision making (see chart for details).  Clinical Course   Patient here for evaluation of chest and throat burning following 3 days of emesis. She is nontoxic on examination and well-hydrated. Clinical picture is not consistent with [place]ave esophagus, bowel obstruction, sepsis. She is able to tolerate oral fluids.discussed home care for vomiting and reflux esophagitis.  Discussed outpatient follow-up and return precautions.  Final Clinical Impressions(s) / ED Diagnoses   Final diagnoses:  Sore throat  Reflux gastritis    New Prescriptions Discharge Medication List as of 06/29/2016 10:18 AM    START taking these medications    Details  ondansetron (ZOFRAN ODT) 4 MG disintegrating tablet Take 1 tablet (4 mg total) by mouth every 8 (eight) hours as needed for nausea or vomiting., Starting Tue 06/29/2016, Print         Tilden FossaElizabeth Andra Matsuo, MD 06/29/16 1651

## 2016-07-01 LAB — CULTURE, GROUP A STREP (THRC)

## 2016-07-05 ENCOUNTER — Encounter (HOSPITAL_COMMUNITY): Payer: Self-pay

## 2016-07-05 ENCOUNTER — Emergency Department (HOSPITAL_COMMUNITY): Payer: PRIVATE HEALTH INSURANCE

## 2016-07-05 ENCOUNTER — Emergency Department (HOSPITAL_COMMUNITY)
Admission: EM | Admit: 2016-07-05 | Discharge: 2016-07-05 | Disposition: A | Discharge status: Home / Self Care | Payer: PRIVATE HEALTH INSURANCE | Attending: Emergency Medicine | Admitting: Emergency Medicine

## 2016-07-05 DIAGNOSIS — J029 Acute pharyngitis, unspecified: Secondary | ICD-10-CM | POA: Diagnosis present

## 2016-07-05 DIAGNOSIS — F17210 Nicotine dependence, cigarettes, uncomplicated: Secondary | ICD-10-CM | POA: Insufficient documentation

## 2016-07-05 DIAGNOSIS — Z79899 Other long term (current) drug therapy: Secondary | ICD-10-CM | POA: Diagnosis not present

## 2016-07-05 DIAGNOSIS — R0789 Other chest pain: Secondary | ICD-10-CM | POA: Diagnosis not present

## 2016-07-05 MED ORDER — PENICILLIN G BENZATHINE 1200000 UNIT/2ML IM SUSP
1.2000 10*6.[IU] | Freq: Once | INTRAMUSCULAR | Status: AC
  Administered 2016-07-05: 1.2 10*6.[IU] via INTRAMUSCULAR
  Filled 2016-07-05: qty 2

## 2016-07-05 MED ORDER — HYDROCODONE-ACETAMINOPHEN 7.5-325 MG/15ML PO SOLN: 10.0000 mL | Freq: Four times a day (QID) | ORAL | 0 refills | Status: DC | PRN

## 2016-07-05 MED ORDER — PANTOPRAZOLE SODIUM 40 MG PO TBEC
40.0000 mg | DELAYED_RELEASE_TABLET | Freq: Once | ORAL | Status: AC
  Administered 2016-07-05: 40 mg via ORAL
  Filled 2016-07-05: qty 1

## 2016-07-05 MED ORDER — PENICILLIN G BENZATHINE & PROC 1200000 UNIT/2ML IM SUSP: 1.2000 10*6.[IU] | Freq: Once | INTRAMUSCULAR | Status: DC

## 2016-07-05 MED ORDER — PREDNISONE 20 MG PO TABS
60.0000 mg | ORAL_TABLET | Freq: Once | ORAL | Status: AC
  Administered 2016-07-05: 60 mg via ORAL
  Filled 2016-07-05: qty 3

## 2016-07-05 NOTE — ED Provider Notes (Signed)
[facility]-EMERGENCY DEPT Provider Note   CSN: [id number] Arrival date & time: 07/05/16  1450  By signing my name below, I, Avnee Patel, attest that this documentation has been prepared under the direction and in the presence of  [employer], PA-C. Electronically Signed: Clovis Pu, ED Scribe. 07/05/16. 3:46 PM.  History   Chief Complaint Chief Complaint  Patient presents with  . Sore Throat   The history is provided by the patient. No language interpreter was used.   HPI Comments:  Jessica Knapp is a 26 y.o. female who presents to the Emergency Department complaining of persistent sore throat x 1.5 weeks. She was seen in the ED for throat pain on 06/29/16 as well. She was diagnosed with GERD. She has been taking Zofran, Zantac, Tums, without relief. 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 Pt also reports voice change x 1 week, intermittent abdominal pain, nausea, vomiting, cough and constant burning central chest pain x 1 week. Pt states eating intermittently makes her chest pain worse. Pt denies fevers, chills, abnormal bowel movements, hematochezia, excessive ibuprofen use, recent fatty food intake, any other associated symptoms and any other modifying factors at this time.    Past Medical History:  Diagnosis Date  . Chronic headaches     Patient Active Problem List   Diagnosis Date Noted  . Cigarette smoker 09/08/2015  . Dyspnea 08/19/2015  . Upper airway cough syndrome 08/14/2015    Past Surgical History:  Procedure Laterality Date  . FINGER SURGERY    . HAND SURGERY      OB History    No data available       Home Medications    Prior to Admission medications   Medication Sig Start Date End Date Taking? Authorizing Provider  albuterol (VENTOLIN HFA) 108 (90 Base) MCG/ACT inhaler Inhale 2 puffs into the lungs every 4  (four) hours as needed for wheezing or shortness of breath.    Historical Provider, MD  amitriptyline (ELAVIL) 50 MG tablet Take 1 tablet (50 mg total) by mouth at bedtime. 04/07/16 05/07/16  Lucia Estelle, NP  APRI 0.15-30 MG-MCG tablet Take 1 tablet by mouth daily. 07/15/15   Historical Provider, MD  famotidine (PEPCID) 20 MG tablet TAKE 1 TABLET BY MOUTH AT BEDTIME 09/15/15   Nyoka Cowden, MD  ondansetron (ZOFRAN ODT) 4 MG disintegrating tablet Take 1 tablet (4 mg total) by mouth every 8 (eight) hours as needed for nausea or vomiting. 06/29/16   Tilden Fossa, MD  oxaprozin (DAYPRO) 600 MG tablet Take 600 mg by mouth as directed.    Historical Provider, MD  pantoprazole (PROTONIX) 40 MG tablet TAKE 1 TABLET BY MOUTH EVERY DAY (TAKE 30 TO 60 MIN BEFORE FIRST MAIN MEAL OF THE DAY) 09/15/15   Nyoka Cowden, MD  rizatriptan (MAXALT) 10 MG tablet Take 10 mg by mouth as needed for migraine. May repeat in 2 hours if needed    Historical Provider, MD  traMADol Janean Sark) 50 MG tablet 1-2 every 4 hours as needed for cough or pain 09/08/15   Nyoka Cowden, MD    Family History Family History  Problem Relation Age of Onset  . Asthma Maternal Grandfather   . Asthma Brother   . Heart disease Maternal Grandmother     Social History Social History  Substance  Use Topics  . Smoking status: Current Some Day Smoker    Packs/day: 0.03    Years: 1.00    Types: Cigarettes  . Smokeless tobacco: Never Used  . Alcohol use No     Allergies   Patient has no known allergies.   Review of Systems Review of Systems  Constitutional: Negative for chills and fever.  HENT: Positive for sore throat and voice change.   Respiratory: Positive for cough.   Cardiovascular: Positive for chest pain.  Gastrointestinal: Positive for abdominal pain, nausea and vomiting.   Physical Exam Updated Vital Signs BP 139/96   Pulse 112   Temp 98.5 F (36.9 C) (Oral)   Resp 18   Ht 5\' 4"  (1.626 m)   Wt 180 lb (81.6 kg)    LMP 05/28/2016   SpO2 100%   BMI 30.90 kg/m   Physical Exam  Constitutional: She is oriented to person, place, and time. She appears well-developed and well-nourished. No distress.  Hoarse voice, speaking in whisper   HENT:  Head: Normocephalic and atraumatic.  Mouth/Throat: Uvula is midline and mucous membranes are normal. Posterior oropharyngeal erythema (mild) present. No oropharyngeal exudate or posterior oropharyngeal edema.  Eyes: Conjunctivae are normal.  Cardiovascular: Regular rhythm.  Tachycardia present.   Pulmonary/Chest: Effort normal and breath sounds normal.  Abdominal: She exhibits no distension. There is tenderness.  Epigastric tenderness   Neurological: She is alert and oriented to person, place, and time.  Skin: Skin is warm and dry.  Psychiatric: She has a normal mood and affect.  Nursing note and vitals reviewed.  ED Treatments / Results  DIAGNOSTIC STUDIES:  Oxygen Saturation is 100% on RA, normal by my interpretation.    COORDINATION OF CARE:  3:44 PM Discussed treatment plan with pt at bedside and pt agreed to plan.  Labs (all labs ordered are listed, but only abnormal results are displayed) Labs Reviewed - No data to display  EKG  EKG Interpretation None       Radiology No results found.  Procedures Procedures (including critical care time)  Medications Ordered in ED Medications  predniSONE (DELTASONE) tablet 60 mg (60 mg Oral Given 07/05/16 1707)  pantoprazole (PROTONIX) EC tablet 40 mg (40 mg Oral Given 07/05/16 1707)  penicillin g benzathine (BICILLIN LA) 1200000 UNIT/2ML injection 1.2 Million Units (1.2 Million Units Intramuscular Given 07/05/16 1720)     Initial Impression / Assessment and Plan / ED Course  I have reviewed the triage vital signs and the nursing notes.  Pertinent labs & imaging results that were available during my care of the patient were reviewed by me and considered in my medical decision making (see chart for  details).  Clinical Course    26 year old female who presents with chest pain and throat pain. Unclear etiology. Possibly reflux esophagitis? Although she has been taking antacids with no relief. Also question possibly residual strep since rapid strep was negative but culture grew out few non group A strep. Will cover with PCN and give steroids. Other possibility may be vocal cord injury with the history of a pop in her throat followed by pain. CXR negative. EKG is NSR.  Will rx Omeprazole and hycet for pain. GI referral given. Patient is NAD, non-toxic, with stable VS. Patient is informed of clinical course, understands medical decision making process, and agrees with plan. Opportunity for questions provided and all questions answered. Return precautions given.   Final Clinical Impressions(s) / ED Diagnoses   Final diagnoses:  Sore throat  Chest pain, atypical    New Prescriptions Discharge Medication List as of 07/05/2016  5:39 PM    START taking these medications   Details  HYDROcodone-acetaminophen (HYCET) 7.5-325 mg/15 ml solution Take 10 mLs by mouth every 6 (six) hours as needed for moderate pain., Starting Mon 07/05/2016, Until Tue 07/05/2017, Print       I personally performed the services described in this documentation, which was scribed in my presence. The recorded information has been reviewed and is accurate.     Bethel Born, PA-C 07/07/16 2029    Rolan Bucco, MD 07/09/16 1345

## 2016-07-05 NOTE — Discharge Instructions (Signed)
Please follow up with GI if symptoms are not improving Take Omeprazole 40mg  (Prilosec) which is over the counter twice daily Take pain medicine as needed

## 2016-07-05 NOTE — ED Triage Notes (Addendum)
PT C/O THROAT PAIN. PT STS SHE HAD ALREADY LOST HER VOICE AND SHE WAS TRYING TO TALK AT WORK WHEN SHE FELT A "POP" IN HER THROAT. PT STS INCREASED PAIN WHEN TALKING OR SWALLOWING. PT WAS ALSO SEEN HERE ON 1/2 FOR CHEST BURNING, IN WHICH SHE STS IT HAS NOT WENT AWAY. DENIES N/V, OR COUGH.

## 2016-08-12 ENCOUNTER — Ambulatory Visit: Payer: Self-pay | Admitting: Internal Medicine

## 2016-08-12 ENCOUNTER — Encounter: Payer: Self-pay | Admitting: Internal Medicine

## 2016-08-12 ENCOUNTER — Ambulatory Visit (INDEPENDENT_AMBULATORY_CARE_PROVIDER_SITE_OTHER): Payer: No Typology Code available for payment source | Admitting: Internal Medicine

## 2016-08-12 VITALS — BP 116/74 | HR 101 | Temp 98.6°F | Ht 65.8 in | Wt 220.6 lb

## 2016-08-12 DIAGNOSIS — G5601 Carpal tunnel syndrome, right upper limb: Secondary | ICD-10-CM

## 2016-08-12 DIAGNOSIS — R5383 Other fatigue: Secondary | ICD-10-CM | POA: Diagnosis not present

## 2016-08-12 DIAGNOSIS — Z113 Encounter for screening for infections with a predominantly sexual mode of transmission: Secondary | ICD-10-CM

## 2016-08-12 DIAGNOSIS — K219 Gastro-esophageal reflux disease without esophagitis: Secondary | ICD-10-CM | POA: Diagnosis not present

## 2016-08-12 DIAGNOSIS — G5603 Carpal tunnel syndrome, bilateral upper limbs: Secondary | ICD-10-CM | POA: Diagnosis not present

## 2016-08-12 DIAGNOSIS — G43801 Other migraine, not intractable, with status migrainosus: Secondary | ICD-10-CM | POA: Diagnosis not present

## 2016-08-12 DIAGNOSIS — R0981 Nasal congestion: Secondary | ICD-10-CM

## 2016-08-12 DIAGNOSIS — R6889 Other general symptoms and signs: Secondary | ICD-10-CM

## 2016-08-12 DIAGNOSIS — G43901 Migraine, unspecified, not intractable, with status migrainosus: Secondary | ICD-10-CM | POA: Diagnosis not present

## 2016-08-12 LAB — CBC WITH DIFFERENTIAL/PLATELET
BASOS ABS: 0 {cells}/uL (ref 0–200)
Basophils Relative: 0 %
EOS ABS: 138 {cells}/uL (ref 15–500)
EOS PCT: 2 %
HCT: 40.3 % (ref 35.0–45.0)
Hemoglobin: 13.2 g/dL (ref 11.7–15.5)
LYMPHS PCT: 49 %
Lymphs Abs: 3381 cells/uL (ref 850–3900)
MCH: 31.9 pg (ref 27.0–33.0)
MCHC: 32.8 g/dL (ref 32.0–36.0)
MCV: 97.3 fL (ref 80.0–100.0)
MONOS PCT: 10 %
MPV: 8.9 fL (ref 7.5–12.5)
Monocytes Absolute: 690 cells/uL (ref 200–950)
Neutro Abs: 2691 cells/uL (ref 1500–7800)
Neutrophils Relative %: 39 %
PLATELETS: 323 10*3/uL (ref 140–400)
RBC: 4.14 MIL/uL (ref 3.80–5.10)
RDW: 12.9 % (ref 11.0–15.0)
WBC: 6.9 10*3/uL (ref 3.8–10.8)

## 2016-08-12 LAB — HIV ANTIBODY (ROUTINE TESTING W REFLEX): HIV 1&2 Ab, 4th Generation: NONREACTIVE

## 2016-08-12 LAB — TSH: TSH: 1.52 mIU/L

## 2016-08-12 MED ORDER — FAMOTIDINE 20 MG PO TABS: 20.0000 mg | ORAL_TABLET | Freq: Every day | ORAL | 5 refills | Status: DC

## 2016-08-12 MED ORDER — FLUTICASONE PROPIONATE 50 MCG/ACT NA SUSP: 2.0000 | Freq: Every day | NASAL | 6 refills | Status: DC

## 2016-08-12 MED ORDER — SUMATRIPTAN SUCCINATE 50 MG PO TABS: 50.0000 mg | ORAL_TABLET | ORAL | 1 refills | Status: DC | PRN

## 2016-08-12 NOTE — Patient Instructions (Addendum)
Ms. Jessica Knapp,  It was nice to meet you today.  I have prescribed sumatriptan to take to break migraine.  I'll call you with your lab results.  I recommend flonase daily to help with sinuses and nasal drainage.  You'll get a call about Sports Medicine appointment in about 1 week.  Best, Dr. Sampson GoonFitzgerald

## 2016-08-12 NOTE — Progress Notes (Signed)
[facility] Progress Note  Subjective:  Jessica BruinsShayla Knapp is a 26 y.o. female who presents to establish care.   Current concerns include:  - "Gets sick a lot" -- about every 2 months gets muscle aches, sore throat, runny nose, very fatigued. 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 Has also been seen by Pulmonologist Dr. Sherene SiresWert in 2017 and thought to have upper airway cough syndrome rather than an asthma-type picture (no childhood history) despite albuterol use. Tried treatment for GERD.  ROS: No recent fevers, chills, or weight loss  - Concern for carpal tunnel syndrome (R > L), has been using bilateral cock-back wrist splints for 2-3 months, mostly at night with little improvement. Says her grandmother had this and required surgery. 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 Would like this evaluated further.  ROS: Denies hand weakness, does report numbness over 3rd and 2nd fingers of R hand  - Hx of Migraines. Continues to have frequent headaches, usually at temples but can be all over; relieved somewhat by ibuprofen/tylenol but not completely; triptans have helped before but again did no take away completely. Light and noise can make worse. Seen previously by Neurology.   PMH: Self-reported asthma (2016 - now) -- uses albuterol inhaler about once a week, more often if sick Acid reflux (2017) -- has tried protonix and pepcid Migraines (2009- current) - Used to follow with Neurology but stopped when insurance no longer covered  FHx: Mother -- alcohol and drug abuse, mood disorder Brother -- asthma M. Grandmother -- Thyroid disease  Surgical history: None  Allergies -- apple, banana, pear, plums, peanuts (hives)  Social:  Works at call center [employer] Lives with boyfriend (26); feels safe in her relationship Owns a car Does not exercise  regularly Smokes occasionally (cigars) -- says has quit before and was not difficult for her Occasional EtOH use -- 1 drink per occasional, wine or liquor Does not use recreational drugs  Recent testing: Pap in 2016, heart stress test 2017, xrays 2018, Ct/MRI 2017 Health Maintenance: had pap in 2016 at [facility], had recent TDAP  Objective: Blood pressure 116/74, pulse (!) 101, temperature 98.6 F (37 C), temperature source Oral, height 5' 5.8" (1.671 m), weight 220 lb 9.6 oz (100.1 kg), last menstrual period 07/30/2016, SpO2 98 %. Body mass index is 35.82 kg/m.  Constitutional: Well-appearing female in NAD, obese HENT: NCAT, mild swelling and erythema of nasal turbinates, TMs normal bilaterally Neck: Symmetric thyroid, mildly ttp  Cardiovascular: RRR, S1, S2, no m/r/g.  Pulmonary/Chest: Effort normal and breath sounds normal. No respiratory distress.  Abdominal: Soft. +BS, NT, ND, no rebound or guarding.  Musculoskeletal: Normal bulk/tone Neurological: AOx3, no focal deficits. Indeterminate phalen's sign -- some "pulling" in right 2nd and 3rd fingers. Negative tinnel's sign. Grip strength 5/5 bilaterally.  Skin: Skin is warm and dry. No rash noted. No erythema.  Psychiatric: Normal mood and affect.  Vitals reviewed  PHQ-2: 0  Assessment/Plan: Frequently sick - Chronic. No concerning symptoms of weight loss. Does report increased fatigue and muscle aches. - Will obtain CBC with diff, TSH, vitamin D level, HIV (never been screened) - Recommended flonase for swollen nasal turbinates - Refilled pepcid given suspected upper airway cough syndrome - Recommended smoking cessation  Carpal tunnel syndrome - Advised continuing wrist splints nightly - Will refer to Sports Medicine for further evaluation,  consideration of steroid injections  Migraines - Intermittent, stable - Prescribed sumatriptan for migraines that do not respond to NSAIDs  Follow-up for annual wellness exams  and as needed. Obtained ROI and requested pap results from [facility]  Dani Gobble, MD [facility], PGY-2

## 2016-08-12 NOTE — Progress Notes (Deleted)
[facility]   Albuterol when 7 once a week Sometimes zantac

## 2016-08-13 DIAGNOSIS — G43909 Migraine, unspecified, not intractable, without status migrainosus: Secondary | ICD-10-CM | POA: Insufficient documentation

## 2016-08-13 DIAGNOSIS — R6889 Other general symptoms and signs: Secondary | ICD-10-CM | POA: Insufficient documentation

## 2016-08-13 DIAGNOSIS — G5600 Carpal tunnel syndrome, unspecified upper limb: Secondary | ICD-10-CM | POA: Insufficient documentation

## 2016-08-13 LAB — VITAMIN D 25 HYDROXY (VIT D DEFICIENCY, FRACTURES): Vit D, 25-Hydroxy: 10 ng/mL — ABNORMAL LOW (ref 30–100)

## 2016-08-13 NOTE — Assessment & Plan Note (Addendum)
-   Chronic. No concerning symptoms of weight loss. Does report increased fatigue and muscle aches. - Will obtain CBC with diff, TSH, vitamin D level, HIV (never been screened) - Recommended flonase for swollen nasal turbinates - Refilled pepcid given suspected upper airway cough syndrome - Recommended smoking cessation

## 2016-08-13 NOTE — Assessment & Plan Note (Signed)
-   Advised continuing wrist splints nightly - Will refer to Sports Medicine for further evaluation, consideration of steroid injections

## 2016-08-13 NOTE — Assessment & Plan Note (Signed)
-   Intermittent, stable - Prescribed sumatriptan for migraines that do not respond to NSAIDs

## 2016-08-16 ENCOUNTER — Telehealth: Payer: Self-pay | Admitting: Internal Medicine

## 2016-08-16 ENCOUNTER — Encounter: Payer: Self-pay | Admitting: Internal Medicine

## 2016-08-16 DIAGNOSIS — E559 Vitamin D deficiency, unspecified: Secondary | ICD-10-CM

## 2016-08-16 MED ORDER — CHOLECALCIFEROL 1.25 MG (50000 UT) PO CAPS: 50000.0000 [IU] | ORAL_CAPSULE | Freq: Every day | ORAL | 0 refills | Status: DC

## 2016-08-16 MED ORDER — CHOLECALCIFEROL 1.25 MG (50000 UT) PO CAPS
50000.0000 [IU] | ORAL_CAPSULE | ORAL | 0 refills | Status: DC
Start: 2016-08-16 — End: 2016-09-10

## 2016-08-16 NOTE — Telephone Encounter (Signed)
Called patient to inform her of low vitamin D levels. Left message, as patient previously granted permission. Recommending 8 weeks of supplementation with high dose cholecalciferol. Will send result letter, as well.

## 2016-08-19 ENCOUNTER — Encounter: Payer: Self-pay | Admitting: Family Medicine

## 2016-08-19 ENCOUNTER — Ambulatory Visit (INDEPENDENT_AMBULATORY_CARE_PROVIDER_SITE_OTHER): Payer: No Typology Code available for payment source | Admitting: Family Medicine

## 2016-08-19 VITALS — BP 134/82 | HR 97 | Ht 64.0 in | Wt 220.0 lb

## 2016-08-19 DIAGNOSIS — G5601 Carpal tunnel syndrome, right upper limb: Secondary | ICD-10-CM | POA: Diagnosis not present

## 2016-08-19 MED ORDER — METHYLPREDNISOLONE ACETATE 40 MG/ML IJ SUSP
40.0000 mg | Freq: Once | INTRAMUSCULAR | Status: AC
  Administered 2016-08-19: 40 mg via INTRA_ARTICULAR

## 2016-08-19 NOTE — Patient Instructions (Signed)
You have carpal tunnel syndrome. Wear the wrist brace at nighttime and as often as possible during the day Ibuprofen 600mg  three times a day with food OR aleve 2 tabs twice a day with food for pain and inflammation. A prednisone dose pack is a consideration. Corticosteroid injection is a consideration to help with pain and inflammation Nerve conduction studies are the other option - typically I'd recommend this if not improving with one of the above or if the diagnosis was uncertain (it's not in your case). Let me know which if these you would like to proceed with.

## 2016-08-23 NOTE — Assessment & Plan Note (Signed)
continue wrist brace at night and as often as possible during the day.  Injection given as well.  Discussed ibuprofen or aleve.  Consider NCVs/EMGs if not improving.  F/u in 1 month.  After informed written consent patient was seated on exam table.  Ultrasound used to identify median nerve, ulnar and radial arteries.  Area overlying right carpal tunnel then prepped with alcohol swab and injected with 2:1 bupivicaine: depomedrol.  Patient tolerated procedure well without immediate complications.

## 2016-08-23 NOTE — Progress Notes (Signed)
PCP and consultation requested by: Jamelle HaringHillary M Fitzgerald, MD  Subjective:   HPI: Patient is a 26 y.o. female here for right hand/wrist pain.  Patient reports she's had about a year of right hand/wrist pain. Gets tightness into 1st-3rd fingers along with cramping and numbness. Tried a wrist brace, special mouse pad, icy hot, biofreeze. Some swelling. Pain level 6/10 and sharp. No skin changes otherwise. Tried ibuprofen. No injury or trauma preceding this. Right handed. Types a lot at work.  Past Medical History:  Diagnosis Date  . Chronic headaches     Current Outpatient Prescriptions on File Prior to Visit  Medication Sig Dispense Refill  . albuterol (VENTOLIN HFA) 108 (90 Base) MCG/ACT inhaler Inhale 2 puffs into the lungs every 4 (four) hours as needed for wheezing or shortness of breath.    . APRI 0.15-30 MG-MCG tablet Take 1 tablet by mouth daily.  3  . Cholecalciferol 50000 units capsule Take 1 capsule (50,000 Units total) by mouth once a week. 8 capsule 0  . famotidine (PEPCID) 20 MG tablet Take 1 tablet (20 mg total) by mouth at bedtime. 30 tablet 5  . fluticasone (FLONASE) 50 MCG/ACT nasal spray Place 2 sprays into both nostrils daily. 16 g 6  . ondansetron (ZOFRAN ODT) 4 MG disintegrating tablet Take 1 tablet (4 mg total) by mouth every 8 (eight) hours as needed for nausea or vomiting. 10 tablet 0  . SUMAtriptan (IMITREX) 50 MG tablet Take 1 tablet (50 mg total) by mouth every 2 (two) hours as needed for migraine. May repeat in 2 hours if headache persists or recurs. 10 tablet 1   No current facility-administered medications on file prior to visit.     Past Surgical History:  Procedure Laterality Date  . FINGER SURGERY    . HAND SURGERY      Allergies  Allergen Reactions  . Apple Hives  . Banana Hives  . Peanut-Containing Drug Products Hives  . Pear Hives  . Plum Pulp Hives    Social History   Social History  . Marital status: Single    Spouse name: N/A   . Number of children: N/A  . Years of education: N/A   Occupational History  . Housekeeper    Social History Main Topics  . Smoking status: Current Some Day Smoker    Packs/day: 0.03    Years: 1.00    Types: Cigarettes  . Smokeless tobacco: Never Used  . Alcohol use No  . Drug use: No  . Sexual activity: Not on file   Other Topics Concern  . Not on file   Social History Narrative  . No narrative on file    Family History  Problem Relation Age of Onset  . Asthma Maternal Grandfather   . Asthma Brother   . Heart disease Maternal Grandmother     BP 134/82   Pulse 97   Ht 5\' 4"  (1.626 m)   Wt 220 lb (99.8 kg)   LMP 07/30/2016 (Exact Date)   BMI 37.76 kg/m   Review of Systems: See HPI above.     Objective:  Physical Exam:  Gen: NAD, comfortable in exam room  Right hand/wrist: No gross deformity, swelling, bruising. TTP over carpal tunnel.  No other tenderness. FROM digits, wrist. Strength 5/5 finger abduction, extension, thumb opposition. + tinels, phalens. Negative finkelsteins. Sensation diminished to light touch 1st and 2nd digits.   Left hand/wrist: FROM without pain.  Assessment & Plan:  1. Right carpal tunnel syndrome -  continue wrist brace at night and as often as possible during the day.  Injection given as well.  Discussed ibuprofen or aleve.  Consider NCVs/EMGs if not improving.  F/u in 1 month.  After informed written consent patient was seated on exam table.  Ultrasound used to identify median nerve, ulnar and radial arteries.  Area overlying right carpal tunnel then prepped with alcohol swab and injected with 2:1 bupivicaine: depomedrol.  Patient tolerated procedure well without immediate complications.

## 2016-09-01 ENCOUNTER — Encounter: Payer: Self-pay | Admitting: Family Medicine

## 2016-09-01 ENCOUNTER — Ambulatory Visit (INDEPENDENT_AMBULATORY_CARE_PROVIDER_SITE_OTHER): Payer: No Typology Code available for payment source | Admitting: Family Medicine

## 2016-09-01 VITALS — BP 128/68 | HR 100 | Temp 98.0°F | Ht 64.0 in | Wt 222.0 lb

## 2016-09-01 DIAGNOSIS — G43109 Migraine with aura, not intractable, without status migrainosus: Secondary | ICD-10-CM

## 2016-09-01 DIAGNOSIS — N912 Amenorrhea, unspecified: Secondary | ICD-10-CM | POA: Diagnosis not present

## 2016-09-01 LAB — POCT URINE PREGNANCY: PREG TEST UR: NEGATIVE

## 2016-09-01 MED ORDER — DEXAMETHASONE SODIUM PHOSPHATE 10 MG/ML IJ SOLN
10.0000 mg | Freq: Once | INTRAMUSCULAR | Status: AC
  Administered 2016-09-01: 10 mg via INTRAMUSCULAR

## 2016-09-01 MED ORDER — KETOROLAC TROMETHAMINE 30 MG/ML IJ SOLN
30.0000 mg | Freq: Once | INTRAMUSCULAR | Status: AC
  Administered 2016-09-01: 30 mg via INTRAMUSCULAR

## 2016-09-01 NOTE — Patient Instructions (Signed)
For migraine prophylaxis: x Magnesium Oxide 400mg  250 mg tabs take 1 tablets 2 times per day. Do not combine with calcium, zinc or iron or take with dairy products.    x Vitamin B2 (riboflavin) 100 mg tablets. Take 1 tablets twice a day with meals. (May turn urine bright yellow)    x Melatonin 3 mg. Take melatonin 1-2 hours prior to bedtime.    I recommend that the next time you have a migraine, consider taking the second dose of Imitrex as directed.  It is common to have persistent migraine after the first dose of Imitrex.  This is why you are allowed to take a second dose a couple of hours later.  Make sure to hydrate well and get plenty of rest.

## 2016-09-01 NOTE — Progress Notes (Signed)
   Subjective: CC: migraine ZOX:[id number]:Jessica Knapp is a 26 y.o. female presenting to clinic today for same day appointment. PCP: Jamelle HaringHillary M Fitzgerald, MD Concerns today include:  1. Migraine Patient reports that she has had a 2 day history of migraine that has been constant, not relieved by Motrin 400mg  or Imitrex x1.  She reports that she did not take extra dose.  She has been to a headache specialist.  She reports photosensitivity, phonosensitivity.  She denies weakness, numbness, tingling, gait abnormality.  She reports that the only thing that helps at this point is the shot.  No fevers, chills, cough. Patient's last menstrual period was 07/23/2016 (exact date). She used her OCPs to skip February cycle.    Allergies  Allergen Reactions  . Apple Hives  . Banana Hives  . Peanut-Containing Drug Products Hives  . Pear Hives  . Plum Pulp Hives   Social Hx reviewed: occ smoker. MedHx, current medications and allergies reviewed.  Please see EMR. ROS: Per HPI  Objective: Office vital signs reviewed. BP 128/68   Pulse 100   Temp 98 F (36.7 C) (Oral)   Ht 5\' 4"  (1.626 m)   Wt 222 lb (100.7 kg)   LMP 07/23/2016 (Exact Date)   SpO2 97%   BMI 38.11 kg/m   Physical Examination:  General: Awake, alert, overweight, No acute distress HEENT: Normal    Eyes: PERRLA, EOMI, sclera white    Nose: nasal turbinates moist, no nasal discharge    Throat: moist mucus membranes, no erythema, no tonsillar exudate.  Airway is patent Neck: no nuchal rigidity  Cardio: regular rate  Pulm: normal work of breathing on room air Neuro: 5/5 UE and LE Strength and light touch sensation grossly intact, CN 2-12 grossly in tact  Results for orders placed or performed in visit on 09/01/16 (from the past 24 hour(s))  POCT urine pregnancy     Status: Normal   Collection Time: 09/01/16  5:18 PM  Result Value Ref Range   Preg Test, Ur Negative Negative    Assessment/ Plan: 26 y.o. female   1. Migraine with  visual aura.  No focal neurologic deficits.  No evidence of meningeal signs/ infection.  UPreg negative.  She is on a combo OCP.  Per patient this has been addressed and no change in contraception needed.  However, it was my understanding that combo OCPs may increase frequency of migraines.  Would consider follow up on this. - Toradol 30mg  , Decadron 10mg  administered in office - Recommended to use second dose of Imitrex prn as directed - Recommended considering MagOx BID, B2 BID and melatonin to reduce recurrence of migraines - Encouraged sleep and hydration - Return precautions reviewed. - Follow up with PCP prn  Orders Placed This Encounter  Procedures  . POCT urine pregnancy   Meds ordered this encounter  Medications  . dexamethasone (DECADRON) injection 10 mg  . ketorolac (TORADOL) 30 MG/ML injection 30 mg   [name], DO PGY-3, [facility]

## 2016-09-02 ENCOUNTER — Ambulatory Visit: Payer: Self-pay | Admitting: Family Medicine

## 2016-09-03 ENCOUNTER — Telehealth: Payer: Self-pay | Admitting: Internal Medicine

## 2016-09-03 NOTE — Telephone Encounter (Signed)
Pt was given a shot for a migraine this week and she is not feeling better. Pt would like to have something called in for pain/migraines Pt uses [facility] on [address]. ep

## 2016-09-03 NOTE — Telephone Encounter (Signed)
Will forward to PCP.  Kiptyn Rafuse L, RN  

## 2016-09-03 NOTE — Telephone Encounter (Signed)
Would need to discuss treatment options with patient/assess in person. Please ask her to schedule an appointment. Likely would benefit from prophylactic treatment with propranolol or valproic acid or may need to consider change in birth control.

## 2016-09-10 ENCOUNTER — Ambulatory Visit (HOSPITAL_COMMUNITY)
Admission: EM | Admit: 2016-09-10 | Discharge: 2016-09-10 | Disposition: A | Discharge status: Home / Self Care | Payer: No Typology Code available for payment source | Attending: Internal Medicine | Admitting: Internal Medicine

## 2016-09-10 ENCOUNTER — Encounter (HOSPITAL_COMMUNITY): Payer: Self-pay | Admitting: Emergency Medicine

## 2016-09-10 DIAGNOSIS — B349 Viral infection, unspecified: Secondary | ICD-10-CM

## 2016-09-10 LAB — POCT URINALYSIS DIP (DEVICE)
Bilirubin Urine: NEGATIVE
Glucose, UA: NEGATIVE mg/dL
Hgb urine dipstick: NEGATIVE
Ketones, ur: NEGATIVE mg/dL
Leukocytes, UA: NEGATIVE
Nitrite: NEGATIVE
PROTEIN: 30 mg/dL — AB
Specific Gravity, Urine: 1.015 (ref 1.005–1.030)
UROBILINOGEN UA: 0.2 mg/dL (ref 0.0–1.0)
pH: 8 (ref 5.0–8.0)

## 2016-09-10 LAB — POCT PREGNANCY, URINE: Preg Test, Ur: NEGATIVE

## 2016-09-10 MED ORDER — ONDANSETRON 4 MG PO TBDP
ORAL_TABLET | ORAL | Status: AC
  Filled 2016-09-10: qty 2

## 2016-09-10 MED ORDER — ONDANSETRON HCL 4 MG PO TABS: 8.0000 mg | ORAL_TABLET | ORAL | 0 refills | Status: DC | PRN

## 2016-09-10 MED ORDER — ONDANSETRON 4 MG PO TBDP
8.0000 mg | ORAL_TABLET | Freq: Once | ORAL | Status: AC
  Administered 2016-09-10: 8 mg via ORAL

## 2016-09-10 NOTE — ED Triage Notes (Signed)
[facility] with a complaint of abdominal cramping and N/V x 3 days.

## 2016-09-10 NOTE — Discharge Instructions (Addendum)
Rest and push fluids.  Urine test today did not suggest UTI.  Symptoms today seem most likely to be related to a stomach bug.  Prescription for ondansetron (nausea medicine) was [facility] to the [facility] on [address].  Note for work, return to work on Monday 3/19.  Recheck if increasing abdominal discomfort, persistent vomiting, new fever >100.5.  Avoid alcohol, smoking, as these can increase nausea/vomiting.

## 2016-09-10 NOTE — ED Provider Notes (Signed)
[facility]    CSN: [id number] Arrival date & time: 09/10/16  1001     History   Chief Complaint Chief Complaint  Patient presents with  . Abdominal Cramping    HPI Jessica Knapp is a 26 y.o. female. She presents today with a 3 day history of malaise, fatigue, some nausea and vomiting. Normal bowel movement yesterday. Tactile temperatures. Able to sip liquids, just doesn't have any appetite and hard to keep things down. Intermittent lower abdominal cramping. Dysuria, no urinary frequency. No unusual vaginal discharge or bleeding. Periods are irregular, last period was in late January. Birth control pill. Coworkers have had similar symptoms. She works in a call center. Little bit of stuffy nose, little bit of cough, not prominent.    HPI  Past Medical History:  Diagnosis Date  . Chronic headaches     Patient Active Problem List   Diagnosis Date Noted  . Frequently sick 08/13/2016  . Carpal tunnel syndrome 08/13/2016  . Migraines 08/13/2016  . Cigarette smoker 09/08/2015  . Dyspnea 08/19/2015  . Upper airway cough syndrome 08/14/2015    Past Surgical History:  Procedure Laterality Date  . FINGER SURGERY    . HAND SURGERY        Home Medications    Prior to Admission medications   Medication Sig Start Date End Date Taking? Authorizing Provider  famotidine (PEPCID) 20 MG tablet Take 1 tablet (20 mg total) by mouth at bedtime. 08/12/16  Yes Hillary Percell BostonMoen Fitzgerald, MD  ondansetron (ZOFRAN) 4 MG tablet Take 2 tablets (8 mg total) by mouth every 4 (four) hours as needed for nausea or vomiting. 09/10/16   Eustace MooreLaura W Kendel Pesnell, MD    Family History Family History  Problem Relation Age of Onset  . Asthma Maternal Grandfather   . Asthma Brother   . Heart disease Maternal Grandmother     Social History Social History  Substance Use Topics  . Smoking status: Current Some Day Smoker    Packs/day: 0.03    Years: 1.00    Types: Cigarettes  . Smokeless tobacco:  Never Used  . Alcohol use No     Allergies   Apple; Banana; Peanut-containing drug products; Pear; and Plum pulp   Review of Systems Review of Systems  All other systems reviewed and are negative.    Physical Exam Triage Vital Signs ED Triage Vitals  Enc Vitals Group     BP 09/10/16 1024 (!) 117/57     Pulse Rate 09/10/16 1024 85     Resp 09/10/16 1024 18     Temp 09/10/16 1024 98.3 F (36.8 C)     Temp Source 09/10/16 1024 Oral     SpO2 09/10/16 1024 100 %     Weight --      Height --      Pain Score 09/10/16 1027 8     Pain Loc --    Orthostatic VS for the past 24 hrs:  BP- Lying BP- Sitting BP- Standing at 0 minutes  09/10/16 1120 127/74 130/88 128/77    Updated Vital Signs BP (!) 117/57 (BP Location: Right Arm)   Pulse 85   Temp 98.3 F (36.8 C) (Oral)   Resp 18   SpO2 100%   Physical Exam  Constitutional: She is oriented to person, place, and time. No distress.  Alert, nicely groomed  HENT:  Head: Atraumatic.  Eyes:  Conjugate gaze, no eye redness/drainage  Neck: Neck supple.  Cardiovascular: Normal rate and regular  rhythm.   Pulmonary/Chest: No respiratory distress. She has no wheezes. She has no rales.  Lungs clear, symmetric breath sounds  Abdominal: Soft. She exhibits no distension. There is no rebound and no guarding.  Mild left lower quadrant discomfort to deep palpation  Musculoskeletal: Normal range of motion.  No leg swelling  Neurological: She is alert and oriented to person, place, and time.  Skin: Skin is warm and dry.  No cyanosis  Nursing note and vitals reviewed.    UC Treatments / Results  Labs Results for orders placed or performed during the hospital encounter of 09/10/16  POCT urinalysis dip (device)  Result Value Ref Range   Glucose, UA NEGATIVE NEGATIVE mg/dL   Bilirubin Urine NEGATIVE NEGATIVE   Ketones, ur NEGATIVE NEGATIVE mg/dL   Specific Gravity, Urine 1.015 1.005 - 1.030   Hgb urine dipstick NEGATIVE NEGATIVE     pH 8.0 5.0 - 8.0   Protein, ur 30 (A) NEGATIVE mg/dL   Urobilinogen, UA 0.2 0.0 - 1.0 mg/dL   Nitrite NEGATIVE NEGATIVE   Leukocytes, UA NEGATIVE NEGATIVE  Pregnancy, urine POC  Result Value Ref Range   Preg Test, Ur NEGATIVE NEGATIVE    Procedures Procedures (including critical care time)  Medications Ordered in UC Medications  ondansetron (ZOFRAN-ODT) disintegrating tablet 8 mg (8 mg Oral Given 09/10/16 1107)      Final Clinical Impressions(s) / UC Diagnoses   Final diagnoses:  Nonspecific syndrome suggestive of viral illness   Rest and push fluids.  Urine test today did not suggest UTI.  Symptoms today seem most likely to be related to a stomach bug.  Prescription for ondansetron (nausea medicine) was sent to the [facility] on [address].  Note for work, return to work on Monday 3/19.  Recheck if increasing abdominal discomfort, persistent vomiting, new fever >100.5.  Avoid alcohol, smoking, as these can increase nausea/vomiting.    New Prescriptions Discharge Medication List as of 09/10/2016 11:44 AM    START taking these medications   Details  ondansetron (ZOFRAN) 4 MG tablet Take 2 tablets (8 mg total) by mouth every 4 (four) hours as needed for nausea or vomiting., Starting Fri 09/10/2016, Normal         Eustace Moore, MD 09/10/16 2216

## 2016-09-15 ENCOUNTER — Encounter: Payer: Self-pay | Admitting: Internal Medicine

## 2016-09-15 ENCOUNTER — Ambulatory Visit (INDEPENDENT_AMBULATORY_CARE_PROVIDER_SITE_OTHER): Payer: No Typology Code available for payment source | Admitting: Internal Medicine

## 2016-09-15 VITALS — BP 122/74 | HR 110 | Temp 98.5°F | Ht 64.0 in | Wt 225.8 lb

## 2016-09-15 DIAGNOSIS — G43709 Chronic migraine without aura, not intractable, without status migrainosus: Secondary | ICD-10-CM | POA: Diagnosis not present

## 2016-09-15 MED ORDER — KETOROLAC TROMETHAMINE 60 MG/2ML IM SOLN
30.0000 mg | Freq: Once | INTRAMUSCULAR | Status: AC
  Administered 2016-09-15: 30 mg via INTRAMUSCULAR

## 2016-09-15 MED ORDER — KETOROLAC TROMETHAMINE 30 MG/ML IJ SOLN
30.0000 mg | Freq: Once | INTRAMUSCULAR | Status: DC
Start: 2016-09-15 — End: 2016-09-15

## 2016-09-15 MED ORDER — DEXAMETHASONE SODIUM PHOSPHATE 10 MG/ML IJ SOLN
10.0000 mg | Freq: Once | INTRAMUSCULAR | Status: AC
  Administered 2016-09-15: 10 mg via INTRAMUSCULAR

## 2016-09-15 NOTE — Progress Notes (Signed)
   Subjective:   Patient: Jessica Knapp Chisum       Birthdate: 04/06/1991       MRN: [id number]      HPI  Jessica Knapp Hobson is a 26 y.o. female presenting for same day appointment for headaches.   Migraine Says she has essentially had a persistent headache since March 7. Worsened today so patient decided to schedule appointment. She has taken two doses of Imitrex as prescribed with no improvement in symptoms. Reports that when she was initially prescribed Imitrex it would "take the edge off" her symptoms but now does not make much of a difference. Endorses photophobia and phonophobia, nausea, and blurry vision. Denies vomiting, neck pain, numbness, weakness, neuro deficits. Says that laying down in a quiet dark room is the only thing that improves her symptoms. She has had to leave work or call out sick multiple times recently due to severity of headaches. She is hoping to get another toradol/decadron shot today.   Smoking status reviewed. Patient is current some day smoker.   Review of Systems See HPI.     Objective:  Physical Exam  Constitutional: She is oriented to person, place, and time and well-developed, well-nourished, and in no distress.  HENT:  Head: Normocephalic and atraumatic.  Eyes: Conjunctivae are normal. Right eye exhibits no discharge. Left eye exhibits no discharge.  Endorsing photosensitivity so did not test pupillary response, however patient able to follow finger with eyes without difficulty. No nystagmus.   Neck:  No meningismus. Full ROM.   Pulmonary/Chest: Effort normal. No respiratory distress.  Neurological: She is alert and oriented to person, place, and time.  CN II-XII grossly intact. 5/5 strength upper and lower extremities bilaterally.   Skin: Skin is warm and dry.  Psychiatric: Affect and judgment normal.    Assessment & Plan:  Migraines Consistent with past migraines, however seems to be occurring more frequently. No signs of meningismus, no neuro deficits.  Administered Toradol and Decadron in office today, and scheduled follow up appointment with PCP (Dr. Sampson GoonFitzgerald) tomorrow morning at 10AM.    Tarri AbernethyAbigail J Unknown Flannigan, MD, MPH PGY-2 [facility] Pager [phone]

## 2016-09-15 NOTE — Telephone Encounter (Signed)
lmovm for pt to return call.  Please schedule an appt when she returns. Fleeger, Maryjo RochesterJessica Dawn, CMA

## 2016-09-15 NOTE — Addendum Note (Signed)
Addended by: Joycelyn ManZIMMERMAN RUMPLE, Rick Carruthers D on: 09/15/2016 04:00 PM   Modules accepted: Orders

## 2016-09-15 NOTE — Patient Instructions (Addendum)
It was nice meeting you today Jessica Knapp!  It is important to come to your appointment with Dr. Sampson GoonFitzgerald tomorrow morning at 10AM so she can make some adjustments to your medications.   If you have any questions or concerns, please feel free to call the clinic.   Be well,  Dr. Natale MilchLancaster

## 2016-09-15 NOTE — Assessment & Plan Note (Signed)
Consistent with past migraines, however seems to be occurring more frequently. No signs of meningismus, no neuro deficits. Administered Toradol and Decadron in office today, and scheduled follow up appointment with PCP (Dr. Sampson GoonFitzgerald) tomorrow morning at 10AM.

## 2016-09-16 ENCOUNTER — Encounter: Payer: Self-pay | Admitting: Internal Medicine

## 2016-09-16 ENCOUNTER — Ambulatory Visit (INDEPENDENT_AMBULATORY_CARE_PROVIDER_SITE_OTHER): Payer: No Typology Code available for payment source | Admitting: Internal Medicine

## 2016-09-16 VITALS — BP 150/98 | Temp 98.9°F | Wt 226.0 lb

## 2016-09-16 DIAGNOSIS — G43801 Other migraine, not intractable, with status migrainosus: Secondary | ICD-10-CM

## 2016-09-16 DIAGNOSIS — H547 Unspecified visual loss: Secondary | ICD-10-CM | POA: Diagnosis not present

## 2016-09-16 DIAGNOSIS — Z309 Encounter for contraceptive management, unspecified: Secondary | ICD-10-CM | POA: Diagnosis not present

## 2016-09-16 MED ORDER — NORETHINDRONE 0.35 MG PO TABS: 1.0000 | ORAL_TABLET | Freq: Every day | ORAL | 11 refills | Status: DC

## 2016-09-16 MED ORDER — PROCHLORPERAZINE MALEATE 5 MG PO TABS: ORAL_TABLET | ORAL | 0 refills | Status: DC

## 2016-09-16 MED ORDER — PROPRANOLOL HCL 20 MG PO TABS: 20.0000 mg | ORAL_TABLET | Freq: Four times a day (QID) | ORAL | 0 refills | Status: DC

## 2016-09-16 NOTE — Progress Notes (Signed)
[facility] Progress Note  Subjective:  Gracy BruinsShayla Knapp is a 26 y.o. female who presents for ongoing migraines.   #Migraines: - The last few months has had mild headaches most days but also has more intense headaches more frequently (1-2 times a week) with pain at temples and across front of head associated with light and noise sensitivity, as well as nausea - Has tried imitrex without relief. Had previously been on a different triptan but says this only "took the edge off" - Has had toradol and decadron injections on 3/7 and 3/21 with some improvement - Resting in dark room helps - zofran not helping with nausea - used to follow at a headache clinic but they no longer accept her insurance - Younger brother also gets bad headaches - has some blurry vision but says she needs to get her glasses prescription updated - is on combined oral contraceptive ROS: No weakness, no acute changes in vision   Objective: Blood pressure (!) 150/98, temperature 98.9 F (37.2 C), temperature source Oral, weight 226 lb (102.5 kg), last menstrual period 09/13/2016. Body mass index is 38.79 kg/m. Constitutional: Obese female in NAD HENT: MMM Neurological: AOx3, no focal deficits. CNII-XII intact, finger-nose-finger normal, visual acuity 20/50 (R) and 20/40 (L) -- not wearing glasses Skin: Skin is warm and dry. No rash noted.  Psychiatric: Somewhat flat affect.   Vitals reviewed  Assessment/Plan: Migraines - Acute on chronic with some improvement after decadron and toradol injection yesterday - Recommended trying prophylactic treatment with propranolol 20 mg qid (BP also elevated at today's visit) - Prescribed compazine to try instead of zofran for nausea as latter not working - Recommended switching to another birth control method given that combined OCPs are contraindicated in patients with migraine with aura; patient prefers pill. Prescribed norethindrone.  - Will refer to HA clinic and  ophthalmology, as needs help knowing which practices will accept her insurance.  Follow-up in 3-4 weeks to see how propranolol is working, or with headache clinic if get appointment soon.  Dani GobbleHillary Fitzgerald, MD [facility], PGY-2

## 2016-09-16 NOTE — Patient Instructions (Signed)
Ms. Jessica Knapp,  Stop taking your current birth control at the end of your current menstrual cycle and start the norethindrone pill instead. It is important to take this at the same time every day.  Take propranolol 20 mg 4 times daily (breast, lunch, dinner, bedtime). Please follow-up at our clinic or with the Headache clinic in 3-4 weeks to see if you are having improvement.  Try compazine for nausea instead of zofran.  You will get a call about ophthalmology and headache clinic referrals.  Best, Dr. Sampson GoonFitzgerald

## 2016-09-18 NOTE — Assessment & Plan Note (Addendum)
-   Acute on chronic with some improvement after decadron and toradol injection yesterday - Recommended trying prophylactic treatment with propranolol 20 mg qid (BP also elevated at today's visit) - Prescribed compazine to try instead of zofran for nausea as latter not working - Recommended switching to another birth control method given that combined OCPs are contraindicated in patients with migraine with aura; patient prefers pill. Prescribed norethindrone.  - Will refer to HA clinic and ophthalmology, as needs help knowing which practices will accept her insurance.

## 2016-09-20 ENCOUNTER — Telehealth: Payer: Self-pay | Admitting: Internal Medicine

## 2016-09-20 NOTE — Telephone Encounter (Signed)
Called patient to follow-up question on ovarian cancer screening given family history. Recommended BRCA testing. Asked her to call back [facility] if she would like me to place referral to genetic counseling now or that we could continue to discuss at her follow-up appointment next month.

## 2016-09-21 ENCOUNTER — Ambulatory Visit (INDEPENDENT_AMBULATORY_CARE_PROVIDER_SITE_OTHER): Payer: No Typology Code available for payment source | Admitting: Obstetrics and Gynecology

## 2016-09-21 ENCOUNTER — Encounter: Payer: Self-pay | Admitting: Obstetrics and Gynecology

## 2016-09-21 VITALS — BP 110/78 | HR 98 | Temp 98.8°F | Wt 212.0 lb

## 2016-09-21 DIAGNOSIS — R112 Nausea with vomiting, unspecified: Secondary | ICD-10-CM | POA: Diagnosis not present

## 2016-09-21 LAB — POCT URINALYSIS DIP (MANUAL ENTRY)
GLUCOSE UA: NEGATIVE
Nitrite, UA: NEGATIVE
Spec Grav, UA: 1.025 (ref 1.030–1.035)
Urobilinogen, UA: 1 (ref ?–2.0)
pH, UA: 6.5 (ref 5.0–8.0)

## 2016-09-21 LAB — POCT UA - MICROSCOPIC ONLY

## 2016-09-21 LAB — POCT URINE PREGNANCY: PREG TEST UR: NEGATIVE

## 2016-09-21 NOTE — Progress Notes (Signed)
   Subjective:   Patient ID: Jessica Knapp, female    DOB: 12/31/1990, 26 y.o.   MRN: [id number]  Patient presents for Same Day Appointment  Chief Complaint  Patient presents with  . Vomiting    HPI: # VOMITING Began 4 days ago. 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   History  Smoking Status  . Current Some Day Smoker  . Packs/day: 0.03  . Years: 1.00  . Types: Cigarettes  Smokeless Tobacco  . Never Used    Past medical history, surgical, family, and social history reviewed and updated in the EMR as appropriate.  Pertinent Historical Findings include: Frequently sick noted on problem list Objective:  BP 110/78   Pulse 98   Temp 98.8 F (37.1 C) (Oral)   Wt 212 lb (96.2 kg)   LMP 09/14/2016   SpO2 99%   BMI 36.39 kg/m  Vitals and nursing note reviewed  Physical Exam Constitutional: Jessica Knapp is oriented to person, place, and time. No distress.  Neck: Neck supple.  Cardiovascular: Normal rate and regular rhythm.   Pulmonary/Chest: No respiratory distress. Jessica Knapp has no wheezes. Jessica Knapp has no rales.  Lungs clear, symmetric breath sounds  Abdominal: Soft. Jessica Knapp exhibits no distension. There is no rebound and no guarding. Generalized tenderness to palpation.  Musculoskeletal: Normal range of motion. No edema Neurological: Jessica Knapp is alert and oriented to person, place, and time.  Skin: Skin is warm and dry.    Assessment & Plan:  1. Non-intractable vomiting with  nausea, unspecified vomiting type Symptoms most consistent with gastroenteritis/stomach bug. Patient is afebrile and vitals are stable. UA showing signs of dehydration but otherwise unremarkable. No signs of UTI. Upreg also negative. Will obtain further blood work to include CBC, CMP, lipase. Counseled patient that if symptoms do not resolve to return to clinic. Encouraged patient to continue to rest and push fluids. Continue current antiemetics. May need change in medications. Note for work given.   Diagnosis and plan were discussed in detail with this patient today. The patient verbalized understanding and agreed with the plan. Patient advised if symptoms worsen return to clinic or ER.   PATIENT EDUCATION PROVIDED: See AVS   Caryl AdaJazma Jakolby Sedivy, DO 09/21/2016, 9:45 AM PGY-3, [facility]

## 2016-09-21 NOTE — Progress Notes (Deleted)
   Subjective:   Patient ID: Jessica Knapp, female    DOB: 12/17/1990, 26 y.o.   MRN: [id number]  Patient presents for Same Day Appointment  Chief Complaint  Patient presents with  . Vomiting    HPI: # *** VOMITING  Vomiting began *** days ago. Progression: *** Number of times vomited in last day: *** Medications tried: *** Recent travel: *** Recent sick contacts: *** Ingested suspicious foods: *** Immunocompromised: ***  Symptoms Diarrhea: *** Abdominal pain: *** Blood in vomit: *** Weight loss: *** Decreased urine output:*** Lightheadedness: *** Fever: *** Bloody stools: ***  ROS see HPI Smoking Status noted   Review of Systems   See HPI for ROS.   History  Smoking Status  . Current Some Day Smoker  . Packs/day: 0.03  . Years: 1.00  . Types: Cigarettes  Smokeless Tobacco  . Never Used    Past medical history, surgical, family, and social history reviewed and updated in the EMR as appropriate.  Pertinent Historical Findings include: Objective:  BP 110/78   Pulse 98   Temp 98.8 F (37.1 C) (Oral)   Wt 212 lb (96.2 kg)   LMP 09/14/2016   SpO2 99%   BMI 36.39 kg/m  Vitals and nursing note reviewed  Physical Exam  Assessment & Plan:  There are no diagnoses linked to this encounter.    Diagnosis and plan along with any newly prescribed medication(s) were discussed in detail with this patient today. The patient verbalized understanding and agreed with the plan. Patient advised if symptoms worsen return to clinic or ER.   PATIENT EDUCATION PROVIDED: See AVS   Caryl AdaJazma Jaquaya Coyle, DO 09/21/2016, 9:43 AM PGY-3, [facility]

## 2016-09-21 NOTE — Patient Instructions (Signed)

## 2016-09-22 ENCOUNTER — Telehealth: Payer: Self-pay | Admitting: Internal Medicine

## 2016-09-22 LAB — COMPREHENSIVE METABOLIC PANEL
A/G RATIO: 1.4 (ref 1.2–2.2)
ALT: 18 IU/L (ref 0–32)
AST: 22 IU/L (ref 0–40)
Albumin: 4.3 g/dL (ref 3.5–5.5)
Alkaline Phosphatase: 70 IU/L (ref 39–117)
BILIRUBIN TOTAL: 0.4 mg/dL (ref 0.0–1.2)
BUN / CREAT RATIO: 11 (ref 9–23)
BUN: 10 mg/dL (ref 6–20)
CO2: 20 mmol/L (ref 18–29)
Calcium: 9.7 mg/dL (ref 8.7–10.2)
Chloride: 99 mmol/L (ref 96–106)
Creatinine, Ser: 0.93 mg/dL (ref 0.57–1.00)
GFR calc non Af Amer: 86 mL/min/{1.73_m2} (ref >59)
GFR, EST AFRICAN AMERICAN: 99 mL/min/{1.73_m2} (ref >59)
Globulin, Total: 3 g/dL (ref 1.5–4.5)
Glucose: 98 mg/dL (ref 65–99)
POTASSIUM: 4.3 mmol/L (ref 3.5–5.2)
Sodium: 136 mmol/L (ref 134–144)
Total Protein: 7.3 g/dL (ref 6.0–8.5)

## 2016-09-22 LAB — CBC
Hematocrit: 40.6 % (ref 34.0–46.6)
Hemoglobin: 13.4 g/dL (ref 11.1–15.9)
MCH: 32.1 pg (ref 26.6–33.0)
MCHC: 33 g/dL (ref 31.5–35.7)
MCV: 97 fL (ref 79–97)
PLATELETS: 320 10*3/uL (ref 150–379)
RBC: 4.18 x10E6/uL (ref 3.77–5.28)
RDW: 12.9 % (ref 12.3–15.4)
WBC: 5.6 10*3/uL (ref 3.4–10.8)

## 2016-09-22 LAB — LIPASE: Lipase: 23 U/L (ref 14–72)

## 2016-09-22 NOTE — Telephone Encounter (Signed)
Labs looked WNL except UA- wanted to make sure before I called pt to inform. Please advise.

## 2016-09-22 NOTE — Telephone Encounter (Signed)
Just received all lab results back today. Everything came back normal. The UA is showing signs of her dehydration. Please encourage her to continue to stay hydrated. She was to follow-up by tomorrow if symptoms not improved. Thanks for informing her!

## 2016-09-22 NOTE — Telephone Encounter (Signed)
Pt would like someone to call her regarding lab results. Labs were ordered by Dr. Doroteo GlassmanPhelps. ep

## 2016-09-23 ENCOUNTER — Ambulatory Visit (INDEPENDENT_AMBULATORY_CARE_PROVIDER_SITE_OTHER): Payer: No Typology Code available for payment source | Admitting: Family Medicine

## 2016-09-23 ENCOUNTER — Emergency Department (HOSPITAL_COMMUNITY): Payer: No Typology Code available for payment source

## 2016-09-23 ENCOUNTER — Emergency Department (HOSPITAL_COMMUNITY)
Admission: EM | Admit: 2016-09-23 | Discharge: 2016-09-24 | Disposition: A | Discharge status: Home / Self Care | Payer: No Typology Code available for payment source | Attending: Emergency Medicine | Admitting: Emergency Medicine

## 2016-09-23 ENCOUNTER — Encounter: Payer: Self-pay | Admitting: Family Medicine

## 2016-09-23 ENCOUNTER — Encounter (HOSPITAL_COMMUNITY): Payer: Self-pay | Admitting: Emergency Medicine

## 2016-09-23 VITALS — BP 136/74 | HR 84 | Temp 99.0°F | Wt 213.0 lb

## 2016-09-23 DIAGNOSIS — Z91010 Allergy to peanuts: Secondary | ICD-10-CM | POA: Diagnosis not present

## 2016-09-23 DIAGNOSIS — N730 Acute parametritis and pelvic cellulitis: Secondary | ICD-10-CM | POA: Diagnosis not present

## 2016-09-23 DIAGNOSIS — R112 Nausea with vomiting, unspecified: Secondary | ICD-10-CM | POA: Diagnosis not present

## 2016-09-23 DIAGNOSIS — E860 Dehydration: Secondary | ICD-10-CM | POA: Diagnosis not present

## 2016-09-23 DIAGNOSIS — F17210 Nicotine dependence, cigarettes, uncomplicated: Secondary | ICD-10-CM | POA: Diagnosis not present

## 2016-09-23 DIAGNOSIS — R102 Pelvic and perineal pain: Secondary | ICD-10-CM

## 2016-09-23 LAB — CBC
HEMATOCRIT: 40.3 % (ref 36.0–46.0)
Hemoglobin: 13.3 g/dL (ref 12.0–15.0)
MCH: 32 pg (ref 26.0–34.0)
MCHC: 33 g/dL (ref 30.0–36.0)
MCV: 96.9 fL (ref 78.0–100.0)
PLATELETS: 304 10*3/uL (ref 150–400)
RBC: 4.16 MIL/uL (ref 3.87–5.11)
RDW: 12.2 % (ref 11.5–15.5)
WBC: 8 10*3/uL (ref 4.0–10.5)

## 2016-09-23 LAB — COMPREHENSIVE METABOLIC PANEL
ALT: 27 U/L (ref 14–54)
AST: 28 U/L (ref 15–41)
Albumin: 4.2 g/dL (ref 3.5–5.0)
Alkaline Phosphatase: 57 U/L (ref 38–126)
Anion gap: 10 (ref 5–15)
BILIRUBIN TOTAL: 0.8 mg/dL (ref 0.3–1.2)
BUN: 11 mg/dL (ref 6–20)
CO2: 22 mmol/L (ref 22–32)
Calcium: 9.6 mg/dL (ref 8.9–10.3)
Chloride: 106 mmol/L (ref 101–111)
Creatinine, Ser: 1.03 mg/dL — ABNORMAL HIGH (ref 0.44–1.00)
Glucose, Bld: 90 mg/dL (ref 65–99)
POTASSIUM: 3.9 mmol/L (ref 3.5–5.1)
Sodium: 138 mmol/L (ref 135–145)
TOTAL PROTEIN: 7.4 g/dL (ref 6.5–8.1)

## 2016-09-23 LAB — URINALYSIS, ROUTINE W REFLEX MICROSCOPIC
Bacteria, UA: NONE SEEN
Glucose, UA: NEGATIVE mg/dL
HGB URINE DIPSTICK: NEGATIVE
Ketones, ur: 5 mg/dL — AB
Leukocytes, UA: NEGATIVE
NITRITE: NEGATIVE
PH: 5 (ref 5.0–8.0)
Protein, ur: 30 mg/dL — AB
SPECIFIC GRAVITY, URINE: 1.033 — AB (ref 1.005–1.030)

## 2016-09-23 LAB — LIPASE, BLOOD: Lipase: 13 U/L (ref 11–51)

## 2016-09-23 MED ORDER — ONDANSETRON 4 MG PO TBDP: 4.0000 mg | ORAL_TABLET | Freq: Three times a day (TID) | ORAL | 0 refills | Status: DC | PRN

## 2016-09-23 MED ORDER — ONDANSETRON HCL 4 MG/2ML IJ SOLN
4.0000 mg | Freq: Once | INTRAMUSCULAR | Status: AC
  Administered 2016-09-23: 4 mg via INTRAVENOUS
  Filled 2016-09-23: qty 2

## 2016-09-23 MED ORDER — SODIUM CHLORIDE 0.9 % IV SOLN
Freq: Once | INTRAVENOUS | Status: AC
  Administered 2016-09-23: 17:00:00 via INTRAVENOUS

## 2016-09-23 NOTE — ED Notes (Signed)
Patient transported to Ultrasound 

## 2016-09-23 NOTE — Telephone Encounter (Signed)
Pt contacted with no answer Vm left to call back.

## 2016-09-23 NOTE — ED Triage Notes (Signed)
Pt. Stated, Jessica Atlasve been here 2 times already and Im still fatigue throwing up and having stomach pain.  They said Im probably dehydrated.

## 2016-09-23 NOTE — ED Provider Notes (Signed)
[facility]-EMER[facility]ENCY DEPT Provider Note   CSN: [id number] Arrival date & time: 09/23/16  1738     History   Chief Complaint Chief Complaint  Patient presents with  . Emesis  . Abdominal Pain  . Fatigue    HPI Jessica Knapp is a 26 y.o. female no sig PMH, here with lower abdominal pain and vomiting x 6 days.  She states she can not keep anything down. She was seen by urgent care and given IVF and antiemetics. She was sent here because she did not feel any better.  She denies any diarrhea, eating spoiled food, fever, urinary or vaginal symptoms.  She denies sick contacts.  She states this has never happened before. There are no further complaints.  10 Systems reviewed and are negative for acute change except as noted in the HPI.   HPI  Past Medical History:  Diagnosis Date  . Chronic headaches     Patient Active Problem List   Diagnosis Date Noted  . Frequently sick 08/13/2016  . Carpal tunnel syndrome 08/13/2016  . Migraines 08/13/2016  . Cigarette smoker 09/08/2015  . Dyspnea 08/19/2015  . Upper airway cough syndrome 08/14/2015    Past Surgical History:  Procedure Laterality Date  . FINGER SURGERY    . HAND SURGERY      OB History    No data available       Home Medications    Prior to Admission medications   Medication Sig Start Date End Date Taking? Authorizing Provider  doxycycline (VIBRAMYCIN) 100 MG capsule Take 1 capsule (100 mg total) by mouth 2 (two) times daily. One po bid x 7 days 09/24/16   Tomasita Crumble, MD  famotidine (PEPCID) 20 MG tablet Take 1 tablet (20 mg total) by mouth at bedtime. 08/12/16   Hillary Percell Boston, MD  norethindrone (MICRONOR,CAMILA,ERRIN) 0.35 MG tablet Take 1 tablet (0.35 mg total) by mouth daily. 09/16/16   Hillary Percell Boston, MD  ondansetron (ZOFRAN ODT) 4 MG disintegrating tablet Take 1 tablet (4 mg total) by mouth every 8 (eight) hours as needed for nausea or vomiting. 09/23/16   Ardith Dark, MD  ondansetron (ZOFRAN) 4  MG tablet Take 2 tablets (8 mg total) by mouth every 4 (four) hours as needed for nausea or vomiting. 09/10/16   Eustace Moore, MD  propranolol (INDERAL) 20 MG tablet Take 1 tablet (20 mg total) by mouth 4 (four) times daily. 09/16/16   Hillary Percell Boston, MD    Family History Family History  Problem Relation Age of Onset  . Asthma Maternal Grandfather   . Asthma Brother   . Heart disease Maternal Grandmother     Social History Social History  Substance Use Topics  . Smoking status: Current Some Day Smoker    Packs/day: 0.03    Years: 1.00    Types: Cigarettes  . Smokeless tobacco: Never Used  . Alcohol use No     Allergies   Apple; Banana; Peanut-containing drug products; Pear; and Plum pulp   Review of Systems Review of Systems   Physical Exam Updated Vital Signs BP 118/85 (BP Location: Right Arm)   Pulse 88   Temp 98.8 F (37.1 C) (Oral)   Resp 16   LMP 09/14/2016   SpO2 100%   Physical Exam  Constitutional: She is oriented to person, place, and time. She appears well-developed and well-nourished. No distress.  HENT:  Head: Normocephalic and atraumatic.  Nose: Nose normal.  Mouth/Throat: Oropharynx is clear and moist.  No oropharyngeal exudate.  Eyes: Conjunctivae and EOM are normal. Pupils are equal, round, and reactive to light. No scleral icterus.  Neck: Normal range of motion. Neck supple. No JVD present. No tracheal deviation present. No thyromegaly present.  Cardiovascular: Normal rate, regular rhythm and normal heart sounds.  Exam reveals no gallop and no friction rub.   No murmur heard. Pulmonary/Chest: Effort normal and breath sounds normal. No respiratory distress. She has no wheezes. She exhibits no tenderness.  Abdominal: Soft. Bowel sounds are normal. She exhibits no distension and no mass. There is tenderness. There is no rebound and no guarding.  Suprapubic and LLQ TTP - mild  Genitourinary: Vaginal discharge found.  Genitourinary Comments:  Moderate amount of thick white DC in the vagina.  No CMT or adnexal tenderness  Musculoskeletal: Normal range of motion. She exhibits no edema or tenderness.  Lymphadenopathy:    She has no cervical adenopathy.  Neurological: She is alert and oriented to person, place, and time. No cranial nerve deficit. She exhibits normal muscle tone.  Skin: Skin is warm and dry. No rash noted. No erythema. No pallor.  Nursing note and vitals reviewed.    ED Treatments / Results  Labs (all labs ordered are listed, but only abnormal results are displayed) Labs Reviewed  WET PREP, GENITAL - Abnormal; Notable for the following:       Result Value   WBC, Wet Prep HPF POC MANY (*)    All other components within normal limits  COMPREHENSIVE METABOLIC PANEL - Abnormal; Notable for the following:    Creatinine, Ser 1.03 (*)    All other components within normal limits  URINALYSIS, ROUTINE W REFLEX MICROSCOPIC - Abnormal; Notable for the following:    Color, Urine AMBER (*)    APPearance HAZY (*)    Specific Gravity, Urine 1.033 (*)    Bilirubin Urine SMALL (*)    Ketones, ur 5 (*)    Protein, ur 30 (*)    Squamous Epithelial / LPF 0-5 (*)    All other components within normal limits  LIPASE, BLOOD  CBC  GC/CHLAMYDIA PROBE AMP () NOT AT [facility]    EKG  EKG Interpretation None       Radiology Kore[place] Transvaginal Non-ob  Result Date: 09/24/2016 CLINICAL DATA:  Acute onset of pelvic pain.  Initial encounter. EXAM: TRANSABDOMINAL AND TRANSVAGINAL ULTRASOUND OF PELVIS TECHNIQUE: Both transabdominal and transvaginal ultrasound examinations of the pelvis were performed. Transabdominal technique was performed for global imaging of the pelvis including uterus, ovaries, adnexal regions, and pelvic cul-de-sac. It was necessary to proceed with endovaginal exam following the transabdominal exam to visualize the ovaries and adnexa in greater detail. COMPARISON:  CT of the abdomen and pelvis from  02/24/2011 FINDINGS: Uterus Measurements: 5.8 x 3.6 x 3.8 cm. No fibroids or other mass visualized. Endometrium Thickness: 0.3 cm.  No focal abnormality visualized. Right ovary Measurements: 3.6 x 1.8 x 2.6 cm. Normal appearance/no adnexal mass. Left ovary Measurements: 3.5 x 2.7 x 2.8 cm. Normal appearance/no adnexal mass. Other findings No abnormal free fluid. Debris is noted within the bladder. IMPRESSION: 1. Unremarkable pelvic ultrasound.  No evidence for ovarian torsion. 2. Debris noted within the bladder. Electronically Signed   By: Roanna RaiderJeffery  Chang M.D.   On: 09/24/2016 00:24   [place] Pelvis Complete  Result Date: 09/24/2016 CLINICAL DATA:  Acute onset of pelvic pain.  Initial encounter. EXAM: TRANSABDOMINAL AND TRANSVAGINAL ULTRASOUND OF PELVIS TECHNIQUE: Both transabdominal and transvaginal ultrasound examinations of the  pelvis were performed. Transabdominal technique was performed for global imaging of the pelvis including uterus, ovaries, adnexal regions, and pelvic cul-de-sac. It was necessary to proceed with endovaginal exam following the transabdominal exam to visualize the ovaries and adnexa in greater detail. COMPARISON:  CT of the abdomen and pelvis from 02/24/2011 FINDINGS: Uterus Measurements: 5.8 x 3.6 x 3.8 cm. No fibroids or other mass visualized. Endometrium Thickness: 0.3 cm.  No focal abnormality visualized. Right ovary Measurements: 3.6 x 1.8 x 2.6 cm. Normal appearance/no adnexal mass. Left ovary Measurements: 3.5 x 2.7 x 2.8 cm. Normal appearance/no adnexal mass. Other findings No abnormal free fluid. Debris is noted within the bladder. IMPRESSION: 1. Unremarkable pelvic ultrasound.  No evidence for ovarian torsion. 2. Debris noted within the bladder. Electronically Signed   By: Roanna Raider M.D.   On: 09/24/2016 00:24    Procedures Procedures (including critical care time)  Medications Ordered in ED Medications  ondansetron (ZOFRAN) injection 4 mg (4 mg Intravenous Given 09/23/16  2327)  doxycycline (VIBRA-TABS) tablet 100 mg (100 mg Oral Given 09/24/16 0102)  cefTRIAXone (ROCEPHIN) 1 g in dextrose 5 % 50 mL IVPB (0 g Intravenous Stopped 09/24/16 0139)     Initial Impression / Assessment and Plan / ED Course  I have reviewed the triage vital signs and the nursing notes.  Pertinent labs & imaging results that were available during my care of the patient were reviewed by me and considered in my medical decision making (see chart for details).     Patient presents to the ED for lower abdominal pain. Labs are normal. Will give zofran for treatment.  She states she has only tried compazine in the past.  Will perform pelvic exam and obtain transvaginal US for evaluation.  [place] negative for acute process.  Pelvic is concerning for STD, possible PID.  She was given ceftriaxone and doxy in the ED.  Will DC with 2 week treatment. PCP fu advised within 3 days.  Reglan PRN nausea. She appears well and in NAD. VS remain within her normal limits and she is safe for DC.  Final Clinical Impressions(s) / ED Diagnoses   Final diagnoses:  Pelvic pain  PID (acute pelvic inflammatory disease)    New Prescriptions New Prescriptions   DOXYCYCLINE (VIBRAMYCIN) 100 MG CAPSULE    Take 1 capsule (100 mg total) by mouth 2 (two) times daily. One po bid x 7 days     Tomasita Crumble, MD 09/24/16 1610

## 2016-09-23 NOTE — Patient Instructions (Signed)

## 2016-09-23 NOTE — Progress Notes (Signed)
Subjective:  Jessica Knapp is a 26 y.o. female who [facility] with a chief complaint of vomiting.   HPI:  Viral Gastroenteritis Symptoms started about a week ago. She has been seen two days ago for  this and diagnosed with viral gastroenteritis and started on compazine. Had a work up at that time that included a negative pregnancy test, negative CMET, negative CBC, and a UA with signs of dehydration. Since that visit, patient feels like she is overall doing worse. She has been throwing up 5-6 times per day. She has mild abdominal pain in her left lower quadrant. She had a normal bowel movement yesterday. Reports that she has not been able to keep anything down at all since the vomiting started. Yesterday she had sex with her boyfriend and noticed sharp pain in her left lower quadrant. LMP nine days ago. Does not think that the compazine is helping.   ROS: Per HPI  PMH: Smoking history reviewed.   Objective:  Physical Exam: BP 136/74   Pulse 84   Temp 99 F (37.2 C) (Oral)   Wt 213 lb (96.6 kg)   LMP 09/14/2016   BMI 36.56 kg/m   Gen: NAD, resting comfortably HEENT: Mildly dry mucus membranes. CV: RRR with no murmurs appreciated Pulm: NWOB, CTAB with no crackles, wheezes, or rhonchi GI: Obese, Normal bowel sounds present. Soft, Nondistended. Mildly tender in LLQ. MSK: no edema, cyanosis, or clubbing noted Skin: Normal cap refill. Normal skin turgor.  Neuro: grossly normal, moves all extremities Psych: Normal affect and thought content  Results for orders placed or performed in visit on 09/21/16 (from the past 72 hour(s))  POCT UA - Microscopic Only     Status: None   Collection Time: 09/21/16 10:05 AM  Result Value Ref Range   WBC, Ur, HPF, POC 0-3    RBC, urine, microscopic NONE    Bacteria, U Microscopic TRACE    Mucus, UA 2+    Epithelial cells, urine per micros >20   POCT urinalysis dipstick     Status: Abnormal   Collection Time: 09/21/16 10:05 AM    Result Value Ref Range   Color, UA yellow yellow   Clarity, UA cloudy (A) clear   Glucose, UA negative negative   Bilirubin, UA small (A) negative   Ketones, POC UA trace (5) (A) negative   Spec Grav, UA 1.025 1.030 - 1.035   Blood, UA trace-intact (A) negative   pH, UA 6.5 5.0 - 8.0   Protein Ur, POC trace (A) negative   Urobilinogen, UA 1.0 Negative - 2.0   Nitrite, UA Negative Negative   Leukocytes, UA Trace (A) Negative  CBC     Status: None   Collection Time: 09/21/16 10:12 AM  Result Value Ref Range   WBC 5.6 3.4 - 10.8 x10E3/uL   RBC 4.18 3.77 - 5.28 x10E6/uL   Hemoglobin 13.4 11.1 - 15.9 g/dL   Hematocrit 16.140.6 09.634.0 - 46.6 %   MCV 97 79 - 97 fL   MCH 32.1 26.6 - 33.0 pg   MCHC 33.0 31.5 - 35.7 g/dL   RDW 04.512.9 40.912.3 - 81.115.4 %   Platelets 320 150 - 379 x10E3/uL  Lipase     Status: None   Collection Time: 09/21/16 10:12 AM  Result Value Ref Range   Lipase 23 14 - 72 U/L  Comprehensive metabolic panel     Status: None   Collection Time: 09/21/16 10:12 AM  Result Value  Ref Range   Glucose 98 65 - 99 mg/dL   BUN 10 6 - 20 mg/dL   Creatinine, Ser 0.98 0.57 - 1.00 mg/dL   GFR calc non Af Amer 86 >59 mL/min/1.73   GFR calc Af Amer 99 >59 mL/min/1.73   BUN/Creatinine Ratio 11 9 - 23   Sodium 136 134 - 144 mmol/L   Potassium 4.3 3.5 - 5.2 mmol/L   Chloride 99 96 - 106 mmol/L   CO2 20 18 - 29 mmol/L   Calcium 9.7 8.7 - 10.2 mg/dL   Total Protein 7.3 6.0 - 8.5 g/dL   Albumin 4.3 3.5 - 5.5 g/dL   Globulin, Total 3.0 1.5 - 4.5 g/dL   Albumin/Globulin Ratio 1.4 1.2 - 2.2   Bilirubin Total 0.4 0.0 - 1.2 mg/dL   Alkaline Phosphatase 70 39 - 117 IU/L   AST 22 0 - 40 IU/L   ALT 18 0 - 32 IU/L  POCT urine pregnancy     Status: None   Collection Time: 09/21/16 10:15 AM  Result Value Ref Range   Preg Test, Ur Negative Negative     Assessment/Plan:  Vomiting Likely viral in nature. Patient appears mildly dehydrated on exam. Patient passing gas and with normal BM yesterday -  doubt obstruction. No risk factors for gastroparesis. Abdominal exam benign - doubt acute surgical process. Pregnancy test negative 2 days ago. Labs otherwise negative. 500cc bolus of NS given in clinic without improvement. Given lack of improvement with IVF, directed to go to ED for further evaluation and resusitation.  Katina Degree. Jimmey Ralph, MD [facility] Resident PGY-3 09/23/2016 5:17 PM

## 2016-09-24 LAB — WET PREP, GENITAL
CLUE CELLS WET PREP: NONE SEEN
SPERM: NONE SEEN
TRICH WET PREP: NONE SEEN
YEAST WET PREP: NONE SEEN

## 2016-09-24 LAB — GC/CHLAMYDIA PROBE AMP (~~LOC~~) NOT AT ARMC
Chlamydia: NEGATIVE
NEISSERIA GONORRHEA: NEGATIVE

## 2016-09-24 MED ORDER — DEXTROSE 5 % IV SOLN: 250.0000 mg | Freq: Once | INTRAVENOUS | Status: DC

## 2016-09-24 MED ORDER — DEXTROSE 5 % IV SOLN
1.0000 g | Freq: Once | INTRAVENOUS | Status: AC
  Administered 2016-09-24: 1 g via INTRAVENOUS
  Filled 2016-09-24: qty 10

## 2016-09-24 MED ORDER — DOXYCYCLINE HYCLATE 100 MG PO TABS
100.0000 mg | ORAL_TABLET | Freq: Once | ORAL | Status: AC
  Administered 2016-09-24: 100 mg via ORAL
  Filled 2016-09-24: qty 1

## 2016-09-24 MED ORDER — DOXYCYCLINE HYCLATE 100 MG PO CAPS: 100.0000 mg | ORAL_CAPSULE | Freq: Two times a day (BID) | ORAL | 0 refills | Status: DC

## 2016-09-28 NOTE — Telephone Encounter (Signed)
Attempted to call pt again- no answer, another vm was left to call the office.

## 2016-10-13 ENCOUNTER — Other Ambulatory Visit: Payer: Self-pay | Admitting: Internal Medicine

## 2016-10-13 DIAGNOSIS — G43801 Other migraine, not intractable, with status migrainosus: Secondary | ICD-10-CM

## 2016-10-14 NOTE — Telephone Encounter (Signed)
Called patient to ask how propranolol is working for her. Reached voicemail. Let her know I will refill her current prescription that she takes 4 times a day, but if things are going well, I can switch her to the longer acting formulation. Asked her to call back if she would like me to switch her to the ER formulation.  Dani Gobble, MD [facility], PGY-2

## 2016-11-11 ENCOUNTER — Encounter: Payer: Self-pay | Admitting: Family Medicine

## 2016-11-11 ENCOUNTER — Ambulatory Visit (INDEPENDENT_AMBULATORY_CARE_PROVIDER_SITE_OTHER): Payer: PRIVATE HEALTH INSURANCE | Admitting: Family Medicine

## 2016-11-11 VITALS — BP 148/84 | HR 80 | Temp 99.0°F | Ht 64.0 in | Wt 222.0 lb

## 2016-11-11 DIAGNOSIS — N912 Amenorrhea, unspecified: Secondary | ICD-10-CM | POA: Insufficient documentation

## 2016-11-11 LAB — POCT URINE PREGNANCY: PREG TEST UR: NEGATIVE

## 2016-11-11 NOTE — Progress Notes (Signed)
    Subjective: CC: pregnancy test  HPI: Patient is a 26 y.o. female presenting to clinic today for a pregnancy test.  Patient noted fatigue and nausea about 1 month ago which has been stable since then. LMP 09/14/16; taking OCPs however she stopped 3 days ago give positive Upreg; She spotted on April 23rd (ver faint) but none since then. Took multiple pregnancy tests that were incongruent: 4/20 was positive 4/25 was negative 5/4 was positive  5/11 was negative  On review of system she notes mild abdominal cramping intermittently, Normal bowel movements, no urinary symptoms, no hematuria, no vaginal discharge, no vulvar pruritus, migraines  Social History: Current smoker    ROS: All other systems reviewed and are negative.  Past Medical History Patient Active Problem List   Diagnosis Date Noted  . Amenorrhea 11/11/2016  . Frequently sick 08/13/2016  . Carpal tunnel syndrome 08/13/2016  . Migraines 08/13/2016  . Cigarette smoker 09/08/2015  . Dyspnea 08/19/2015  . Upper airway cough syndrome 08/14/2015    Medications- reviewed and updated Current Outpatient Prescriptions  Medication Sig Dispense Refill  . doxycycline (VIBRAMYCIN) 100 MG capsule Take 1 capsule (100 mg total) by mouth 2 (two) times daily. One po bid x 7 days 14 capsule 0  . famotidine (PEPCID) 20 MG tablet Take 1 tablet (20 mg total) by mouth at bedtime. 30 tablet 5  . norethindrone (MICRONOR,CAMILA,ERRIN) 0.35 MG tablet Take 1 tablet (0.35 mg total) by mouth daily. 1 Package 11  . ondansetron (ZOFRAN ODT) 4 MG disintegrating tablet Take 1 tablet (4 mg total) by mouth every 8 (eight) hours as needed for nausea or vomiting. 20 tablet 0  . ondansetron (ZOFRAN) 4 MG tablet Take 2 tablets (8 mg total) by mouth every 4 (four) hours as needed for nausea or vomiting. 20 tablet 0  . propranolol (INDERAL) 20 MG tablet TAKE 1 TABLET (20 MG TOTAL) BY MOUTH 4 (FOUR) TIMES DAILY. 120 tablet 0   No current  facility-administered medications for this visit.     Objective: Office vital signs reviewed. BP (!) 148/84   Pulse 80   Temp 99 F (37.2 C) (Oral)   Ht 5\' 4"  (1.626 m)   Wt 222 lb (100.7 kg)   LMP 09/11/2016   BMI 38.11 kg/m    Physical Examination:  General: Awake, alert, Well- nourished, NAD GI: Normoactive bowel sounds, abdomen soft, nondistended, nontender to palpation.   Urine pregnancy   Assessment/Plan: Amenorrhea Patient's urine pregnancy test was negative. Discussed that she may have had a chemical/early pregnancy loss. Discussed multiple options for the patient's amenorrhea and fatigue. She opted for watchful waiting. If she has not had a period within 1-2 weeks, she will follow up for repeat urine pregnancy. She may benefit from a transvaginal ultrasound as well given she's had normal periods prior to all this. I suspect her fatigue may be secondary to propanolol, as this was temporally related to her symptoms. Patient notes very frequent migraines prior to starting this medication and would like to continue it for now. In the future, if her symptoms do not resolve despite normal menses, may consider another migraine prophylaxis (may need something additional to cover her elevated BP).   Orders Placed This Encounter  Procedures  . POCT urine pregnancy    No orders of the defined types were placed in this encounter.   Joanna Puffrystal S. Lilou Kneip PGY-3, [facility]

## 2016-11-11 NOTE — Assessment & Plan Note (Addendum)
Patient's urine pregnancy test was negative. Discussed that she may have had a chemical/early pregnancy loss. Discussed multiple options for the patient's amenorrhea and fatigue. She opted for watchful waiting. If she has not had a period within 1-2 weeks, she will follow up for repeat urine pregnancy. She may benefit from a transvaginal ultrasound as well given she's had normal periods prior to all this. I suspect her fatigue may be secondary to propanolol, as this was temporally related to her symptoms. Patient notes very frequent migraines prior to starting this medication and would like to continue it for now. In the future, if her symptoms do not resolve despite normal menses, may consider another migraine prophylaxis (may need something additional to cover her elevated BP).

## 2016-11-11 NOTE — Patient Instructions (Signed)
It was nice to meet you.  Your pregnancy test was negative.  If you do not have a period within the next 1-2 weeks, follow up for a repeat pregnancy test.  The propanolol may be contributing to your fatigue.

## 2016-11-19 ENCOUNTER — Ambulatory Visit (INDEPENDENT_AMBULATORY_CARE_PROVIDER_SITE_OTHER): Payer: PRIVATE HEALTH INSURANCE | Admitting: Family Medicine

## 2016-11-19 ENCOUNTER — Ambulatory Visit (HOSPITAL_COMMUNITY)
Admission: RE | Admit: 2016-11-19 | Discharge: 2016-11-19 | Disposition: A | Discharge status: Home / Self Care | Payer: No Typology Code available for payment source | Source: Ambulatory Visit | Attending: Family Medicine | Admitting: Family Medicine

## 2016-11-19 ENCOUNTER — Encounter: Payer: Self-pay | Admitting: Family Medicine

## 2016-11-19 VITALS — BP 118/64 | HR 93 | Temp 98.1°F | Ht 64.0 in | Wt 223.2 lb

## 2016-11-19 DIAGNOSIS — Z3200 Encounter for pregnancy test, result unknown: Secondary | ICD-10-CM | POA: Diagnosis not present

## 2016-11-19 DIAGNOSIS — N939 Abnormal uterine and vaginal bleeding, unspecified: Secondary | ICD-10-CM | POA: Diagnosis not present

## 2016-11-19 LAB — POCT URINE PREGNANCY: Preg Test, Ur: NEGATIVE

## 2016-11-19 NOTE — Patient Instructions (Addendum)
Thank you so much for coming to visit today! We will obtain labs today. We will also schedule imaging for you. Please go to the [facility] immediately if your bleeding or pain worsens.  Dr. Caroleen Hammanumley

## 2016-11-20 LAB — CBC
Hematocrit: 38.4 % (ref 34.0–46.6)
Hemoglobin: 12.6 g/dL (ref 11.1–15.9)
MCH: 32.1 pg (ref 26.6–33.0)
MCHC: 32.8 g/dL (ref 31.5–35.7)
MCV: 98 fL — AB (ref 79–97)
PLATELETS: 293 10*3/uL (ref 150–379)
RBC: 3.92 x10E6/uL (ref 3.77–5.28)
RDW: 13.4 % (ref 12.3–15.4)
WBC: 5.6 10*3/uL (ref 3.4–10.8)

## 2016-11-20 LAB — BETA HCG QUANT (REF LAB): hCG Quant: <1 m[IU]/mL

## 2016-11-29 NOTE — Progress Notes (Signed)
Subjective:     Patient ID: Jessica BruinsShayla Knapp, female   DOB: 04/09/1991, 26 y.o.   MRN: [id number]  HPI Mrs. Jessica Knapp is a 26yo female presenting today for irregular bleeding. Reports amenorrhea, noted at office visit on 5/17. Then began spotting on Tuesday and yesterday had sharp cramping following by a "gush of blood, more blood than I have ever seen before." Has had constant pain since then in both RLQ and LLQ. Blood flow has decreased, requiring 4 pads yesterday and 2 pads so far today. Was fatigued and dizzy yesterday, but this has resolved. Denies shortness of breath. Stopped taking OCPs when she became amenorrheic since she was worried about pregnancy--negative pregnancy test on 5/17.  Smoker  Review of Systems Per HPI    Objective:   Physical Exam  Constitutional: She appears well-developed and well-nourished. No distress.  Eyes:  Pink conjunctiva  Cardiovascular: Normal rate and regular rhythm.   No murmur heard. Pulmonary/Chest: Effort normal. No respiratory distress. She has no wheezes.  Abdominal: Soft.  Mild tenderness in RLQ and LLQ, Negative Murphy's. Negative Rebound.  Genitourinary:  Genitourinary Comments: Sterile speculum exam with closed cervix, some blood noted but no products, no adnexal tenderness on exam  Psychiatric: She has a normal mood and affect. Her behavior is normal.      Assessment and Plan:     Vaginal Bleeding May be return of menses following amenorrhea and thickened endometrium, however some concern for Ectopic Pregnancy given severe cramping and "more blood than ever before." Urine pregnancy test negative. Will check hCG and CBC. Will obtain pelvic and vaginal US. To go to [facility] if symptoms worsen.

## 2016-12-06 ENCOUNTER — Ambulatory Visit (INDEPENDENT_AMBULATORY_CARE_PROVIDER_SITE_OTHER): Payer: No Typology Code available for payment source | Admitting: Internal Medicine

## 2016-12-06 ENCOUNTER — Encounter: Payer: Self-pay | Admitting: Internal Medicine

## 2016-12-06 VITALS — BP 120/80 | HR 109 | Temp 98.5°F | Ht 64.0 in | Wt 220.8 lb

## 2016-12-06 DIAGNOSIS — J020 Streptococcal pharyngitis: Secondary | ICD-10-CM

## 2016-12-06 DIAGNOSIS — J069 Acute upper respiratory infection, unspecified: Secondary | ICD-10-CM | POA: Diagnosis not present

## 2016-12-06 LAB — POCT RAPID STREP A (OFFICE): RAPID STREP A SCREEN: NEGATIVE

## 2016-12-06 MED ORDER — BENZONATATE 100 MG PO CAPS: 100.0000 mg | ORAL_CAPSULE | Freq: Three times a day (TID) | ORAL | 0 refills | Status: DC | PRN

## 2016-12-06 MED ORDER — ALBUTEROL SULFATE HFA 108 (90 BASE) MCG/ACT IN AERS: 2.0000 | INHALATION_SPRAY | Freq: Four times a day (QID) | RESPIRATORY_TRACT | 2 refills | Status: DC | PRN

## 2016-12-06 NOTE — Progress Notes (Signed)
   [facility] Jessica CharsAsiyah Jamee Keach, MD Phone: [phone]  Reason For Visit: SDA URI   # URI  Has been sick for 2 days. Sore throat, fatigue. Feel like it is hard to breath at times. Hx of allergies and asthma, uses albuterol inhaler. Has had a lot of coughing. Dry cough. Patient with a history of laryngitis, states she's had it several times and has needed steroids in the past. Steroids sometimes help and sometimes do not help. No wheezing.  Nasal discharge: None  Medications tried: Mucinex, Advil sinus and flu Sick contacts: none   Symptoms Fever:none  Headache or face pain: none  Sneezing: none  Scratchy throat: yes  Allergies: yes  Muscle aches: None  Severe fatigue: None  Shortness of breath: yes  Sore throat or swollen glands: yes    Past Medical History Reviewed problem list.  Medications- reviewed and updated No additions to family history Social history- patient is a non-smoker  Objective: BP 120/80 (BP Location: Left Arm, Patient Position: Sitting, Cuff Size: Normal)   Pulse (!) 109   Temp 98.5 F (36.9 C) (Oral)   Ht 5\' 4"  (1.626 m)   Wt 220 lb 12.8 oz (100.2 kg)   LMP 11/19/2016   SpO2 98%   BMI 37.90 kg/m  Gen: NAD, alert, cooperative with exam HEENT: Normal    Neck: No masses palpated. Positive for lymphadenopathy    Ears: Tympanic membranes intact, normal light reflex, no erythema, no bulging    Eyes: PERRLA,    Nose: nasal turbinates moist    Throat: Erythematous throat, slightly irritated tonsils, 2+ tonsillar swelling. Cardio: regular rate and rhythm, S1S2 heard, no murmurs appreciated Pulm: clear to auscultation bilaterally, no wheezes, rhonchi or rales Skin: dry, intact, no rashes or lesions   Assessment/Plan: See problem based a/p  URI (upper respiratory infection) Laryngitis likely due to a viral URI. Patient states she's had several episodes of laryngitis in the past.hich did not improve over the 5-7 days. Patient  states that she needed steroids in the past for these; which sometimes works and sometimes did not work. She complains of shortness of breath, though no signs of increased work of breathing, not tachypneic, normal pulse ox, good airway movement, no wheezing. - Continue  cold and flu medicine - benzonatate (TESSALON) 100 MG capsule; Take 1 capsule (100 mg total) by mouth 3 (three) times daily as needed for cough.  Dispense: 20 capsule; Refill: 0 - Provide albuterol (PROVENTIL HFA;VENTOLIN HFA) 108 (90 Base) MCG/ACT inhaler; as patient states that this helps - Rapid Strep A

## 2016-12-06 NOTE — Patient Instructions (Signed)
Laryngitis Laryngitis is swelling (inflammation) of your vocal cords. This causes hoarseness, coughing, loss of voice, sore throat, or a dry throat. When your vocal cords are inflamed, your voice sounds different. Laryngitis can be temporary (acute) or long-term (chronic). Most cases of acute laryngitis improve with time. Chronic laryngitis is laryngitis that lasts for more than three weeks. Follow these instructions at home:  Drink enough fluid to keep your pee (urine) clear or pale yellow.  Breathe in moist air. Use a humidifier if you live in a dry climate.  Take medicines only as told by your doctor.  Do not smoke cigarettes or electronic cigarettes. If you need help quitting, ask your doctor.  Talk as little as possible. Also avoid whispering, which can cause vocal strain.  Write instead of talking. Do this until your voice is back to normal. Contact a doctor if:  You have a fever.  Your pain is worse.  You have trouble swallowing. Get help right away if:  You cough up blood.  You have trouble breathing. This information is not intended to replace advice given to you by your health care provider. Make sure you discuss any questions you have with your health care provider. Document Released: 06/03/2011 Document Revised: 11/20/2015 Document Reviewed: 11/27/2013 Elsevier Interactive Patient Education  2018 [employer]  

## 2016-12-08 NOTE — Assessment & Plan Note (Signed)
Laryngitis likely due to a viral URI. Patient states she's had several episodes of laryngitis in the past.hich did not improve over the 5-7 days. Patient states that she needed steroids in the past for these; which sometimes works and sometimes did not work. She complains of shortness of breath, though no signs of increased work of breathing, not tachypneic, normal pulse ox, good airway movement, no wheezing. - Continue  cold and flu medicine - benzonatate (TESSALON) 100 MG capsule; Take 1 capsule (100 mg total) by mouth 3 (three) times daily as needed for cough.  Dispense: 20 capsule; Refill: 0 - Provide albuterol (PROVENTIL HFA;VENTOLIN HFA) 108 (90 Base) MCG/ACT inhaler; as patient states that this helps - Rapid Strep A

## 2016-12-13 ENCOUNTER — Encounter: Payer: Self-pay | Admitting: Internal Medicine

## 2016-12-13 ENCOUNTER — Ambulatory Visit (INDEPENDENT_AMBULATORY_CARE_PROVIDER_SITE_OTHER): Payer: No Typology Code available for payment source | Admitting: Internal Medicine

## 2016-12-13 VITALS — BP 138/90 | HR 99 | Temp 98.6°F | Wt 223.0 lb

## 2016-12-13 DIAGNOSIS — J040 Acute laryngitis: Secondary | ICD-10-CM | POA: Insufficient documentation

## 2016-12-13 MED ORDER — FLUTICASONE PROPIONATE 50 MCG/ACT NA SUSP: 2.0000 | Freq: Every day | NASAL | 6 refills | Status: DC

## 2016-12-13 MED ORDER — PREDNISONE 20 MG PO TABS: 20.0000 mg | ORAL_TABLET | Freq: Every day | ORAL | 0 refills | Status: DC

## 2016-12-13 NOTE — Patient Instructions (Addendum)
I will give you 4 days of prednisone. We have discussed the risk of this medication.

## 2016-12-13 NOTE — Progress Notes (Signed)
   [facility] Noralee CharsAsiyah Mikell, MD Phone: [phone]  Reason For Visit: laryngitis f/u  Please see my previous note  # Recently seen by me on 6/11 or viral laryngitis. She is now hasn't laryngitis for about 2 weeks. She denies any significant congestion, or other symptoms. She states that she heard throat is still sore. Over the last 3-4 years patient has had laryngitis every year around 4-5 times. She states previously she has had laryngitis for up to 2-3 weeks, possibly even a month. She has been seen by ENT up in [place] as this is where she has insurance coverage; ENT did some tests however they did not find anything as far she knows. she was not able to see ENT here in [place]. Patient has also been seen by Dr. Sherene SiresWert, pulmonology and has been placed on reflux medications. Patient takes Pepcid twice daily and Protonix though this is not on her list. Patient also takes Flonase daily for allergies. She is concerned that this   Past Medical History Reviewed problem list.  Medications- reviewed and updated No additions to family history Social history- patient is a smoker  Objective: BP 138/90   Pulse 99   Temp 98.6 F (37 C) (Oral)   Wt 223 lb (101.2 kg)   LMP 11/19/2016   SpO2 99%   BMI 38.28 kg/m  Gen: NAD, alert, cooperative with exam HEENT: Normal    Neck: No masses palpated. No lymphadenopathy    Nose: nasal turbinates without congestion,  no nasal polyps noted     Throat: moist mucus membranes, slightly erythematous,similar to previous  Cardio: regular rate and rhythm, S1S2 heard, no murmurs appreciated Pulm: clear to auscultation bilaterally, no wheezes, rhonchi or rales Skin: dry, intact, no rashes or lesions    Assessment/Plan: See problem based a/p  Laryngitis Follow-up visit/same day for laryngitis - Previously seen by m for this issue about 1 week ago. Has been referred in 2017 to ENT and pulmonology. Do not have records for ENT as  patient was seen in [place] due to where her insurance network would allow her to go.  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

## 2016-12-13 NOTE — Assessment & Plan Note (Addendum)
Follow-up visit/same day for laryngitis - Previously seen by m for this issue about 1 week ago. Has been referred in 2017 to ENT and pulmonology. Do not have records for ENT as patient was seen in [place] due to where her insurance network would allow her to go.  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

## 2016-12-21 ENCOUNTER — Ambulatory Visit (INDEPENDENT_AMBULATORY_CARE_PROVIDER_SITE_OTHER): Payer: No Typology Code available for payment source | Admitting: Neurology

## 2016-12-21 ENCOUNTER — Encounter: Payer: Self-pay | Admitting: Neurology

## 2016-12-21 VITALS — BP 110/80 | HR 96 | Ht 64.0 in | Wt 230.0 lb

## 2016-12-21 DIAGNOSIS — G43709 Chronic migraine without aura, not intractable, without status migrainosus: Secondary | ICD-10-CM | POA: Diagnosis not present

## 2016-12-21 DIAGNOSIS — Z720 Tobacco use: Secondary | ICD-10-CM | POA: Diagnosis not present

## 2016-12-21 MED ORDER — ELETRIPTAN HYDROBROMIDE 40 MG PO TABS: ORAL_TABLET | ORAL | 2 refills | Status: DC

## 2016-12-21 MED ORDER — NORTRIPTYLINE HCL 25 MG PO CAPS: 25.0000 mg | ORAL_CAPSULE | Freq: Every day | ORAL | 3 refills | Status: DC

## 2016-12-21 NOTE — Patient Instructions (Addendum)
Migraine Recommendations: 1.  Start nortriptyline 25mg  at bedtime.  Call in 4 weeks with update and we can adjust dose if needed. 2.  Take eletriptan 40mg  at earliest onset of headache.  May repeat dose once in 2 hours if needed.  Do not exceed two tablets in 24 hours. 3.  Stop Tylenol, sumatriptan and antihistamine.  Limit use of pain relievers to no more than 2 days out of the week.  These medications include acetaminophen, ibuprofen, triptans and narcotics.  This will help reduce risk of rebound headaches. 4.  Be aware of common food triggers such as processed sweets, processed foods with nitrites (such as deli meat, hot dogs, sausages), foods with MSG, alcohol (such as wine), chocolate, certain cheeses, certain fruits (dried fruits, some citrus fruit), vinegar, diet soda. 4.  Avoid caffeine 5.  Routine exercise 6.  Proper sleep hygiene 7.  Stay adequately hydrated with water 8.  Keep a headache diary. 9.  Maintain proper stress management. 10.  Do not skip meals. 11.  Consider supplements:  Magnesium citrate 400mg  to 600mg  daily, riboflavin 400mg , Coenzyme Q 10 100mg  three times daily 12.  Try to stop smoking 13.  Follow up in 3 months.

## 2016-12-21 NOTE — Progress Notes (Signed)
NEUROLOGY CONSULTATION NOTE  Jessica Knapp MRN: [id number] DOB: 05/03/1991  Referring provider: Dr. Sampson Goon Primary care provider: Dr. Sampson Goon  Reason for consult:  headache  HISTORY OF PRESENT ILLNESS: Jessica Knapp is a 26 year old female who presents for migraines.  History supplemented by PCP note.  Onset:  65-78 years old Location:  Unilateral temple (either side), bilateral temples, mid-frontal Quality:  1)  Mild:  Pressure; 2) Moderate:  Throbbing, 3) Severe:  throbbing Intensity:  1)  Mild: 2-3/10; 2) Moderate: 5-6/10; 3) Severe: 10/10 Aura:  no Prodrome:  Difficult to concentrate Postdrome:  fatigue Associated symptoms:  Moderate:  Nausea, vomiting (severe only), photophobia, phonophobia, blurred vision.  She has not had any new worse headache of her life, waking up from sleep Duration:  1) Mild: constant, 2) Moderate: over 4 hours, 3) Severe: 2 to 3 days Frequency:  1) Mild: constant, 2) Moderate: 4-5 days/month, 3) Severe: 15-20 days/month Frequency of abortive medication: Tylenol with antihistamine (almost daily) Triggers/exacerbating factors:  no Relieving factors:  resting in dark room Activity:  aggravates  Past NSAIDS:  ibuprofen Past analgesics:  Tramadol, Excedrin Past abortive triptans:   rizatriptan 10mg  Past muscle relaxants:  no Past anti-emetic:  no Past antihypertensive medications:  Propranolol 20mg  4x/day (dizziness) Past antidepressant medications:  amitriptyline 50mg  Past anticonvulsant medications:  topiramate (unsure of dose) Past vitamins/Herbal/Supplements:  Magnesium, riboflavin Other past therapies:  no  Current NSAIDS:  no Current analgesics:  Tylenol w/antihistamine Current triptans:  Sumatriptan 50mg  Current anti-emetic:  Zofran Current muscle relaxants:  no Current anti-anxiolytic:  no Current sleep aide:  no Current Antihypertensive medications:  no Current Antidepressant medications:  no Current Anticonvulsant  medications:  no Current Vitamins/Herbal/Supplements:  no Current Antihistamines/Decongestants:  Flonase Other therapy:  no  Caffeine:  Occasionally (to treat migraine) Alcohol:  no Smoker:  sometimes Diet:  Hydrates, altered diet Exercise:  no Depression/anxiety:  no Sleep hygiene:  poor Family history of headache:  brother  09/23/16 CMP: Na 138, K 3.9, Cl 106, CO2 22, glucose 90, BUN 11, Cr 1.03, total bili 0.8, ALP 57, AST 28 and ALT 27.  PAST MEDICAL HISTORY: Past Medical History:  Diagnosis Date  . Asthma   . Chronic headaches   . GERD (gastroesophageal reflux disease)     PAST SURGICAL HISTORY: Past Surgical History:  Procedure Laterality Date  . FINGER SURGERY    . HAND SURGERY      MEDICATIONS: Current Outpatient Prescriptions on File Prior to Visit  Medication Sig Dispense Refill  . albuterol (PROVENTIL HFA;VENTOLIN HFA) 108 (90 Base) MCG/ACT inhaler Inhale 2 puffs into the lungs every 6 (six) hours as needed for wheezing or shortness of breath. 1 Inhaler 2  . benzonatate (TESSALON) 100 MG capsule Take 1 capsule (100 mg total) by mouth 3 (three) times daily as needed for cough. 20 capsule 0  . famotidine (PEPCID) 20 MG tablet Take 1 tablet (20 mg total) by mouth at bedtime. 30 tablet 5  . fluticasone (FLONASE) 50 MCG/ACT nasal spray Place 2 sprays into both nostrils daily. 16 g 6  . norethindrone (MICRONOR,CAMILA,ERRIN) 0.35 MG tablet Take 1 tablet (0.35 mg total) by mouth daily. 1 Package 11  . ondansetron (ZOFRAN ODT) 4 MG disintegrating tablet Take 1 tablet (4 mg total) by mouth every 8 (eight) hours as needed for nausea or vomiting. 20 tablet 0   No current facility-administered medications on file prior to visit.     ALLERGIES: Allergies  Allergen Reactions  . Apple  Hives  . Banana Hives  . Peanut-Containing Drug Products Hives  . Pear Hives  . Plum Pulp Hives    FAMILY HISTORY: Family History  Problem Relation Age of Onset  . Asthma Maternal  Grandfather   . Asthma Brother   . Heart disease Maternal Grandmother     SOCIAL HISTORY: Social History   Social History  . Marital status: Single    Spouse name: N/A  . Number of children: N/A  . Years of education: N/A   Occupational History  . Housekeeper    Social History Main Topics  . Smoking status: Current Some Day Smoker    Packs/day: 0.03    Years: 1.00    Types: Cigarettes  . Smokeless tobacco: Never Used  . Alcohol use No  . Drug use: No  . Sexual activity: Not on file   Other Topics Concern  . Not on file   Social History Narrative  . No narrative on file    REVIEW OF SYSTEMS: Constitutional: No fevers, chills, or sweats, no generalized fatigue, change in appetite Eyes: No visual changes, double vision, eye pain Ear, nose and throat: No hearing loss, ear pain, nasal congestion, sore throat Cardiovascular: No chest pain, palpitations Respiratory:  No shortness of breath at rest or with exertion, wheezes GastrointestinaI: No nausea, vomiting, diarrhea, abdominal pain, fecal incontinence Genitourinary:  No dysuria, urinary retention or frequency Musculoskeletal:  No neck pain, back pain Integumentary: No rash, pruritus, skin lesions Neurological: as above Psychiatric: No depression, insomnia, anxiety Endocrine: No palpitations, fatigue, diaphoresis, mood swings, change in appetite, change in weight, increased thirst Hematologic/Lymphatic:  No purpura, petechiae. Allergic/Immunologic: no itchy/runny eyes, nasal congestion, recent allergic reactions, rashes  PHYSICAL EXAM: Vitals:   12/21/16 0803  BP: 110/80  Pulse: 96   General: No acute distress.  Patient appears well-groomed.  Head:  Normocephalic/atraumatic Eyes:  fundi examined but not visualized Neck: supple, no paraspinal tenderness, full range of motion Back: No paraspinal tenderness Heart: regular rate and rhythm Lungs: Clear to auscultation bilaterally. Vascular: No carotid  bruits. Neurological Exam: Mental status: alert and oriented to person, place, and time, recent and remote memory intact, fund of knowledge intact, attention and concentration intact, speech fluent and not dysarthric, language intact. Cranial nerves: CN I: not tested CN II: pupils equal, round and reactive to light, visual fields intact CN III, IV, VI:  full range of motion, no nystagmus, no ptosis CN V: facial sensation intact CN VII: upper and lower face symmetric CN VIII: hearing intact CN IX, X: gag intact, uvula midline CN XI: sternocleidomastoid and trapezius muscles intact CN XII: tongue midline Bulk & Tone: normal, no fasciculations. Motor:  5/5 throughout Sensation: temperature and vibration sensation intact. Deep Tendon Reflexes:  2+ throughout, toes downgoing.  Finger to nose testing:  Without dysmetria.  Heel to shin:  Without dysmetria.  Gait:  Normal station and stride.  Able to turn and tandem walk. Romberg negative.  IMPRESSION: Chronic migraine with medication overuse headache  PLAN: 1.  Initiate nortriptyline 25mg  at bedtime, we can increase dose to 50mg  at bedtime in 4 weeks if needed. 2.  Stop sumatriptan and Tylenol/antihistamine. 3.  Relpax 40mg  for abortive therapy (limited to no more than 2 days out of the week). 4.  Increase exercise 5.  Smoking cessation 6.  Sleep hygiene 7.  Continue hydration and diet 8.  Follow up in 3 months.  Thank you for allowing me to take part in the care of this patient.  Shon Millet, DO  CC:  Casey Burkitt, MD

## 2017-01-25 ENCOUNTER — Ambulatory Visit (INDEPENDENT_AMBULATORY_CARE_PROVIDER_SITE_OTHER): Payer: No Typology Code available for payment source | Admitting: Allergy and Immunology

## 2017-01-25 ENCOUNTER — Encounter: Payer: Self-pay | Admitting: Allergy and Immunology

## 2017-01-25 VITALS — BP 122/74 | HR 100 | Temp 98.6°F | Resp 16 | Ht 64.5 in | Wt 215.0 lb

## 2017-01-25 DIAGNOSIS — H1010 Acute atopic conjunctivitis, unspecified eye: Secondary | ICD-10-CM | POA: Insufficient documentation

## 2017-01-25 DIAGNOSIS — J3089 Other allergic rhinitis: Secondary | ICD-10-CM | POA: Diagnosis not present

## 2017-01-25 DIAGNOSIS — T781XXA Other adverse food reactions, not elsewhere classified, initial encounter: Secondary | ICD-10-CM

## 2017-01-25 DIAGNOSIS — H1013 Acute atopic conjunctivitis, bilateral: Secondary | ICD-10-CM

## 2017-01-25 DIAGNOSIS — L500 Allergic urticaria: Secondary | ICD-10-CM | POA: Diagnosis not present

## 2017-01-25 DIAGNOSIS — J4530 Mild persistent asthma, uncomplicated: Secondary | ICD-10-CM

## 2017-01-25 MED ORDER — MONTELUKAST SODIUM 10 MG PO TABS: 10.0000 mg | ORAL_TABLET | Freq: Every day | ORAL | 5 refills | Status: DC

## 2017-01-25 MED ORDER — AZELASTINE HCL 0.15 % NA SOLN: 2.0000 | Freq: Two times a day (BID) | NASAL | 5 refills | Status: DC

## 2017-01-25 MED ORDER — OLOPATADINE HCL 0.2 % OP SOLN: 1.0000 [drp] | OPHTHALMIC | 5 refills | Status: DC

## 2017-01-25 MED ORDER — LEVOCETIRIZINE DIHYDROCHLORIDE 5 MG PO TABS: 5.0000 mg | ORAL_TABLET | Freq: Every evening | ORAL | 5 refills | Status: DC

## 2017-01-25 NOTE — Assessment & Plan Note (Signed)
   Aeroallergen avoidance measures have been discussed and provided in written form.  A prescription has been provided for levocetirizine, 5mg  daily as needed.  A prescription has been provided for azelastine nasal spray, 1-2 sprays per nostril 2 times daily as needed. Proper nasal spray technique has been discussed and demonstrated.   I have also recommended nasal saline spray (i.e., Simply Saline) or nasal saline lavage (i.e., NeilMed) as needed and prior to medicated nasal sprays.  The risks and benefits of aeroallergen immunotherapy have been discussed. The patient is motivated to initiate immunotherapy if insurance coverage is favorable. She will let us know how she would like to proceed.

## 2017-01-25 NOTE — Patient Instructions (Addendum)
Seasonal and perennial allergic rhinitis  Aeroallergen avoidance measures have been discussed and provided in written form.  A prescription has been provided for levocetirizine, 5mg  daily as needed.  A prescription has been provided for azelastine nasal spray, 1-2 sprays per nostril 2 times daily as needed. Proper nasal spray technique has been discussed and demonstrated.   I have also recommended nasal saline spray (i.e., Simply Saline) or nasal saline lavage (i.e., NeilMed) as needed and prior to medicated nasal sprays.  The risks and benefits of aeroallergen immunotherapy have been discussed. The patient is motivated to initiate immunotherapy if insurance coverage is favorable. She will let us know how she would like to proceed.  Allergic conjunctivitis  Treatment plan as outlined above for allergic rhinitis.  A prescription has been provided for Pataday, one drop per eye daily as needed.  I have also recommended eye lubricant drops (i.e., Natural Tears) as needed.  Mild persistent asthma  Tobacco cessation has been discussed and encouraged.  A prescription has been provided for montelukast 10 mg daily at bedtime.  Continue albuterol HFA, 1-2 inhalations every 4-6 hours as needed.  I have also recommended taking albuterol 10-15 minutes prior to exercise.  Subjective and objective measures of pulmonary function will be followed and the treatment plan will be adjusted accordingly.  Oral allergy syndrome Oral allergy syndrome.  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 Peeling or cooking the food has shown to reduce symptoms and antihistamines may also relieve symptoms. Immunotherapy to the cross reacting pollens has improved or cured OAS in many patients, though this has not been consistent for all patients. Typically OAS is limited to itching or swelling of mucosal tissues from the lips to the back of the throat.   Information  about OAS has been discussed and provided in written form.  All foods causing symptoms are to be avoided.  Should symptoms progress beyond the mouth and throat, 911 is to be called immediately.  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 Skin tests to select food allergens were negative today. There are no concomitant symptoms concerning for anaphylaxis.  Allergen avoidance measures have been discussed.  Levocetirizine and montelukast have been prescribed (as above).   Should symptoms recur in the absence of pollen or dog epithelia exposure, a journal is to be kept recording any foods eaten, beverages consumed, medications taken within a 6 hour period prior to the onset of symptoms, as well as record activities being performed, and environmental conditions. For any symptoms concerning for anaphylaxis, 911 is to be called immediately.    Return in about 2 months (around 03/27/2017), or if symptoms worsen or fail to improve.  Reducing Pollen Exposure  The American Academy of Allergy, Asthma and Immunology suggests the following steps to reduce your exposure to pollen during allergy seasons.    1. Do not hang sheets or clothing out to dry; pollen may collect on these items. 2. Do not mow lawns or spend time around freshly cut grass; mowing stirs up pollen. 3. Keep windows closed at night.  Keep car windows closed while driving. 4. Minimize morning activities outdoors, a time when pollen counts are usually at their highest. 5. Stay indoors as much as possible when pollen counts or humidity is high and on windy days when pollen tends to remain in the air longer. 6. Use air conditioning when possible.  Many  air conditioners have filters that trap the pollen spores. 7. Use a HEPA room air filter to remove pollen form the indoor air you breathe.   Control of House Dust Mite Allergen  House dust mites  play a major role in allergic asthma and rhinitis.  They occur in environments with high humidity wherever human skin, the food for dust mites is found. High levels have been detected in dust obtained from mattresses, pillows, carpets, upholstered furniture, bed covers, clothes and soft toys.  The principal allergen of the house dust mite is found in its feces.  A gram of dust may contain 1,000 mites and 250,000 fecal particles.  Mite antigen is easily measured in the air during house cleaning activities.    1. Encase mattresses, including the box spring, and pillow, in an air tight cover.  Seal the zipper end of the encased mattresses with wide adhesive tape. 2. Wash the bedding in water of 130 degrees Farenheit weekly.  Avoid cotton comforters/quilts and flannel bedding: the most ideal bed covering is the dacron comforter. 3. Remove all upholstered furniture from the bedroom. 4. Remove carpets, carpet padding, rugs, and non-washable window drapes from the bedroom.  Wash drapes weekly or use plastic window coverings. 5. Remove all non-washable stuffed toys from the bedroom.  Wash stuffed toys weekly. 6. Have the room cleaned frequently with a vacuum cleaner and a damp dust-mop.  The patient should not be in a room which is being cleaned and should wait 1 hour after cleaning before going into the room. 7. Close and seal all heating outlets in the bedroom.  Otherwise, the room will become filled with dust-laden air.  An electric heater can be used to heat the room. Reduce indoor humidity to less than 50%.  Do not use a humidifier.  Control of Dog or Cat Allergen  Avoidance is the best way to manage a dog or cat allergy. If you have a dog or cat and are allergic to dog or cats, consider removing the dog or cat from the home. If you have a dog or cat but don't want to find it a new home, or if your family wants a pet even though someone in the household is allergic, here are some strategies that may help  keep symptoms at bay:  1. Keep the pet out of your bedroom and restrict it to only a few rooms. Be advised that keeping the dog or cat in only one room will not limit the allergens to that room. 2. Don't pet, hug or kiss the dog or cat; if you do, wash your hands with soap and water. 3. High-efficiency particulate air (HEPA) cleaners run continuously in a bedroom or living room can reduce allergen levels over time. 4. Place electrostatic material sheet in the air inlet vent in the bedroom. 5. Regular use of a high-efficiency vacuum cleaner or a central vacuum can reduce allergen levels. 6. Giving your dog or cat a bath at least once a week can reduce airborne allergen.  Control of Mold Allergen  Mold and fungi can grow on a variety of surfaces provided certain temperature and moisture conditions exist.  Outdoor molds grow on plants, decaying vegetation and soil.  The major outdoor mold, Alternaria and Cladosporium, are found in very high numbers during hot and dry conditions.  Generally, a late Summer - Fall peak is seen for common outdoor fungal spores.  Rain will temporarily lower outdoor mold spore count, but counts rise rapidly when the  rainy period ends.  The most important indoor molds are Aspergillus and Penicillium.  Dark, humid and poorly ventilated basements are ideal sites for mold growth.  The next most common sites of mold growth are the bathroom and the kitchen.  Outdoor MicrosoftMold Control 8. Use air conditioning and keep windows closed 9. Avoid exposure to decaying vegetation. 10. Avoid leaf raking. 11. Avoid grain handling. 12. Consider wearing a face mask if working in moldy areas.  Indoor Mold Control 1. Maintain humidity below 50%. 2. Clean washable surfaces with 5% bleach solution. 3. Remove sources e.g. Contaminated carpets.  Control of Cockroach Allergen  Cockroach allergen has been identified as an important cause of acute attacks of asthma, especially in urban settings.   There are fifty-five species of cockroach that exist in the [place], however only three, the [place], [place] and Oriental species produce allergen that can affect patients with Asthma.  Allergens can be obtained from fecal particles, egg casings and secretions from cockroaches.    1. Remove food sources. 2. Reduce access to water. 3. Seal access and entry points. 4. Spray runways with 0.5-1% Diazinon or Chlorpyrifos 5. Blow boric acid power under stoves and refrigerator. 6. Place bait stations (hydramethylnon) at feeding sites.  Oral Allergy Syndrome (OAS)  Oral Allergy Syndrome or OAS is an allergic reaction to certain (usually fresh) fruits, nuts, and vegetables. The allergy is not actually an allergy to food but a syndrome that develops in pollen allergy sufferers. The immune system mistakes the food proteins for the pollen proteins and causes an allergic reaction. For instance, an allergy to ragweed is associated with OAS reactions to banana, watermelon, cantaloupe, honeydew, zucchini, and cucumber. This does not mean that all sufferers of an allergy to ragweed will experience adverse effects from all or even any of these foods. Reactions may begin with one type of food and with reactions to others developing later. However, reaction to one or more foods in any given category does not necessarily mean a person is allergic to all foods in that group. OAS sufferers may have a number of reactions that usually occur very rapidly, within minutes of eating a trigger food. The most common reaction is an itching or burning sensation in the lips, mouth, and/or pharynx. Sometimes other reactions can be triggered in the eyes, nose, and skin. The most severe reactions can result in asthma problems or anaphylaxis.  If a sufferer is able to swallow the food, there is a good chance that there will be a reaction later in the gastrointestinal tract. Vomiting, diarrhea, severe indigestion, or cramps may occur.   Treatment: An OAS sufferer should avoid foods to which they are allergic. Peeling or cooking the food has shown to reduce symptoms in the throat and mouth, but may not relieve symptoms in the gastrointestinal tract. Antihistamines may also relieve the symptoms of the allergy. Persons with severe reactions may consider carrying injectable epinephrine should systemic symptoms occur. Allergy immunotherapy to the pollens has improved or cured OAS in many patients, though this has not been consistent for all patients. Laban Emperor. Alder pollen: almonds, apples, celery, cherries, hazel nuts, peaches, pears, parsley, strawberry, raspberry . Birch pollen: almonds, apples, apricots, avocados, bananas, carrots, celery, cherries, chicory, coriander, fennel, fig, hazel nuts, kiwifruit, nectarines, parsley, parsnips, peaches, pears, peppers, plums, potatoes, prunes, soy, strawberries, wheat; Potential: walnuts . Grass pollen: fig, melons, tomatoes, oranges . Mugwort pollen : carrots, celery, coriander, fennel, parsley, peppers, sunflower . Ragweed pollen : banana, cantaloupe, cucumber, green  pepper, paprika, sunflower seeds/oil, honeydew, watermelon, zucchini, echinacea, artichoke, dandelions, honey (if bees pollinate from wild flowers), hibiscus or chamomile tea . Possible cross-reactions (to any of the above): berries (strawberries, blueberries, raspberries, etc), citrus (oranges, lemons, etc), grapes, mango, figs, peanut, pineapple, pomegranates, watermelon

## 2017-01-25 NOTE — Assessment & Plan Note (Signed)
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 Skin tests to select food allergens were negative today. There are no concomitant symptoms concerning for anaphylaxis.  Allergen avoidance measures have been discussed.  Levocetirizine and montelukast have been prescribed (as above).   Should symptoms recur in the absence of pollen or dog epithelia exposure, a journal is to be kept recording any foods eaten, beverages consumed, medications taken within a 6 hour period prior to the onset of symptoms, as well as record activities being performed, and environmental conditions. For any symptoms concerning for anaphylaxis, 911 is to be called immediately.

## 2017-01-25 NOTE — Assessment & Plan Note (Signed)
   Treatment plan as outlined above for allergic rhinitis.  A prescription has been provided for Pataday, one drop per eye daily as needed.  I have also recommended eye lubricant drops (i.e., Natural Tears) as needed. 

## 2017-01-25 NOTE — Assessment & Plan Note (Signed)
Oral allergy syndrome.  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 Peeling or cooking the food has shown to reduce symptoms and antihistamines may also relieve symptoms. Immunotherapy to the cross reacting pollens has improved or cured OAS in many patients, though this has not been consistent for all patients. Typically OAS is limited to itching or swelling of mucosal tissues from the lips to the back of the throat.   Information about OAS has been discussed and provided in written form.  All foods causing symptoms are to be avoided.  Should symptoms progress beyond the mouth and throat, 911 is to be called immediately. 

## 2017-01-25 NOTE — Progress Notes (Signed)
New Patient Note  RE: Jessica Knapp MRN: [id number] DOB: 07/15/1990 Date of Office Visit: 01/25/2017  Referring provider: Berton Bon, MD Primary care provider: Casey Burkitt, MD  Chief Complaint: Allergic Rhinitis ; Urticaria; Asthma; and Food Intolerance   History of present illness: Jessica Knapp is a 26 y.o. female seen today in consultation requested by Berton Bon, MD.  She complains of nasal congestion, rhinorrhea, postnasal drainage, nasal pruritus, ocular pruritus, frontal sinus pressure, and occasional maxillary sinus pressure.  These symptoms occur year around but are most frequent and severe in the springtime and in the fall.  Specific triggers include pollen, cats, dogs, and dust.  She has tried loratadine, fexofenadine, pseudoephedrine, and fluticasone nasal spray without adequate symptom relief. She complains of oral pruritus with the consumption of fresh/raw apples, bananas, pears, and plums.  She does not experience concomitant urticaria, angioedema, cardiopulmonary symptoms, or other GI symptoms.  She does not experience oral pruritus if the fruit is peeled and cooked.  She also develops hives when she is outdoors during high pollen counts and when in contact with, or if she is around, dogs. Jessica Knapp was diagnosed with asthma approximately 2 years ago.  Her asthma symptoms consist of wheezing, chest tightness, dyspnea, and coughing.  Her asthma is triggered by upper respiratory tract infections, pollen exposure, heat, cold, and exercise.  She is currently careful to limit her exercise and exposure to asthma triggers.  While avoiding exercise and asthma triggers she still experiences asthma symptoms a few times per month and has nocturnal awakenings due to lower respiratory symptoms one or 2 nights per month on average. She has smoked cigarettes since May 2017 and currently smokes less than one cigarette per day on average.   Assessment and  plan: Seasonal and perennial allergic rhinitis  Aeroallergen avoidance measures have been discussed and provided in written form.  A prescription has been provided for levocetirizine, 5mg  daily as needed.  A prescription has been provided for azelastine nasal spray, 1-2 sprays per nostril 2 times daily as needed. Proper nasal spray technique has been discussed and demonstrated.   I have also recommended nasal saline spray (i.e., Simply Saline) or nasal saline lavage (i.e., NeilMed) as needed and prior to medicated nasal sprays.  The risks and benefits of aeroallergen immunotherapy have been discussed. The patient is motivated to initiate immunotherapy if insurance coverage is favorable. She will let us know how she would like to proceed.  Allergic conjunctivitis  Treatment plan as outlined above for allergic rhinitis.  A prescription has been provided for Pataday, one drop per eye daily as needed.  I have also recommended eye lubricant drops (i.e., Natural Tears) as needed.  Mild persistent asthma  Tobacco cessation has been discussed and encouraged.  A prescription has been provided for montelukast 10 mg daily at bedtime.  Continue albuterol HFA, 1-2 inhalations every 4-6 hours as needed.  I have also recommended taking albuterol 10-15 minutes prior to exercise.  Subjective and objective measures of pulmonary function will be followed and the treatment plan will be adjusted accordingly.  Oral allergy syndrome Oral allergy syndrome.  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 Peeling or cooking the food has shown to reduce symptoms and antihistamines may also relieve symptoms. Immunotherapy to the cross reacting pollens has improved or cured OAS in many patients, though this has not been consistent for all patients. Typically OAS is limited to itching or swelling  of mucosal tissues from the lips to the back of the throat.    Information about OAS has been discussed and provided in written form.  All foods causing symptoms are to be avoided.  Should symptoms progress beyond the mouth and throat, 911 is to be called immediately.  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 Skin tests to select food allergens were negative today. There are no concomitant symptoms concerning for anaphylaxis.  Allergen avoidance measures have been discussed.  Levocetirizine and montelukast have been prescribed (as above).   Should symptoms recur in the absence of pollen or dog epithelia exposure, a journal is to be kept recording any foods eaten, beverages consumed, medications taken within a 6 hour period prior to the onset of symptoms, as well as record activities being performed, and environmental conditions. For any symptoms concerning for anaphylaxis, 911 is to be called immediately.    Meds ordered this encounter  Medications  . levocetirizine (XYZAL) 5 MG tablet    Sig: Take 1 tablet (5 mg total) by mouth every evening.    Dispense:  30 tablet    Refill:  5  . Azelastine HCl 0.15 % SOLN    Sig: Place 2 sprays into both nostrils 2 (two) times daily.    Dispense:  30 mL    Refill:  5  . Olopatadine HCl (PATADAY) 0.2 % SOLN    Sig: Place 1 drop into both eyes 1 day or 1 dose.    Dispense:  1 Bottle    Refill:  5  . montelukast (SINGULAIR) 10 MG tablet    Sig: Take 1 tablet (10 mg total) by mouth at bedtime.    Dispense:  30 tablet    Refill:  5    Diagnostics: Spirometry: FVC was 3.56 L and FEV1 was 2.90 L (106% predicted) with an FEV1 ratio of 94%. This study was performed while the patient was asymptomatic.  Please see scanned spirometry results for details. Allergy skin testing: Positive to grass pollens, weed pollens, ragweed pollen, tree pollens, molds, cat hair, dog epithelia, cockroach antigen, and dust mite  antigen.    Physical examination: Blood pressure 122/74, pulse 100, temperature 98.6 F (37 C), temperature source Oral, resp. rate 16, height 5' 4.5" (1.638 m), weight 215 lb (97.5 kg), SpO2 98 %.  General: Alert, interactive, in no acute distress. HEENT: TMs pearly gray, turbinates edematous with clear discharge, post-pharynx mildly erythematous. Neck: Supple without lymphadenopathy. Lungs: Clear to auscultation without wheezing, rhonchi or rales. CV: Normal S1, S2 without murmurs. Abdomen: Nondistended, nontender. Skin: Warm and dry, without lesions or rashes. Extremities:  No clubbing, cyanosis or edema. Neuro:   Grossly intact.  Review of systems:  Review of systems negative except as noted in HPI / PMHx or noted below: Review of Systems  Constitutional: Negative.   HENT: Negative.   Eyes: Negative.   Respiratory: Negative.   Cardiovascular: Negative.   Gastrointestinal: Negative.   Genitourinary: Negative.   Musculoskeletal: Negative.   Skin: Negative.   Neurological: Negative.   Endo/Heme/Allergies: Negative.   Psychiatric/Behavioral: Negative.     Past medical history:  Past Medical History:  Diagnosis Date  . Asthma   . Chronic headaches   . GERD (gastroesophageal reflux disease)   . Recurrent upper respiratory infection (URI)   . Urticaria     Past surgical history:  Past Surgical History:  Procedure Laterality Date  . FINGER SURGERY    .  HAND SURGERY      Family history: Family History  Problem Relation Age of Onset  . Asthma Maternal Grandfather   . Asthma Brother   . Heart disease Maternal Grandmother     Social history: Social History   Social History  . Marital status: Single    Spouse name: N/A  . Number of children: N/A  . Years of education: N/A   Occupational History  . Housekeeper    Social History Main Topics  . Smoking status: Current Some Day Smoker    Packs/day: 0.03    Years: 1.00    Types: Cigarettes  . Smokeless  tobacco: Never Used  . Alcohol use No  . Drug use: No  . Sexual activity: Not on file   Other Topics Concern  . Not on file   Social History Narrative  . No narrative on file   Environmental History: The patient lives in an apartment with carpeting throughout, gas heat, and central air.  There is no known mold/water damage in the apartment.  She has no pets.  She started smoking in May 2017, 1 cigarette per day or less.  Allergies as of 01/25/2017      Reactions   Apple Hives   Banana Hives   Peanut-containing Drug Products Hives   Pear Hives   Plum Pulp Hives      Medication List       Accurate as of 01/25/17  4:17 PM. Always use your most recent med list.          albuterol 108 (90 Base) MCG/ACT inhaler Commonly known as:  PROVENTIL HFA;VENTOLIN HFA Inhale 2 puffs into the lungs every 6 (six) hours as needed for wheezing or shortness of breath.   Azelastine HCl 0.15 % Soln Place 2 sprays into both nostrils 2 (two) times daily.   benzonatate 100 MG capsule Commonly known as:  TESSALON Take 1 capsule (100 mg total) by mouth 3 (three) times daily as needed for cough.   eletriptan 40 MG tablet Commonly known as:  RELPAX Take 1 tablet earliest onset of migraine.  May repeat once in 2 hours if headache persists or recurs.   famotidine 20 MG tablet Commonly known as:  PEPCID Take 1 tablet (20 mg total) by mouth at bedtime.   fluticasone 50 MCG/ACT nasal spray Commonly known as:  FLONASE Place 2 sprays into both nostrils daily.   levocetirizine 5 MG tablet Commonly known as:  XYZAL Take 1 tablet (5 mg total) by mouth every evening.   montelukast 10 MG tablet Commonly known as:  SINGULAIR Take 1 tablet (10 mg total) by mouth at bedtime.   norethindrone 0.35 MG tablet Commonly known as:  MICRONOR,CAMILA,ERRIN Take 1 tablet (0.35 mg total) by mouth daily.   nortriptyline 25 MG capsule Commonly known as:  PAMELOR Take 1 capsule (25 mg total) by mouth at  bedtime.   Olopatadine HCl 0.2 % Soln Commonly known as:  PATADAY Place 1 drop into both eyes 1 day or 1 dose.   ondansetron 4 MG disintegrating tablet Commonly known as:  ZOFRAN ODT Take 1 tablet (4 mg total) by mouth every 8 (eight) hours as needed for nausea or vomiting.       Known medication allergies: Allergies  Allergen Reactions  . Apple Hives  . Banana Hives  . Peanut-Containing Drug Products Hives  . Pear Hives  . Plum Pulp Hives    I appreciate the opportunity to take part in [place]'s care. Please  do not hesitate to contact me with questions.  Sincerely,   R. Edgar Frisk, MD

## 2017-01-25 NOTE — Assessment & Plan Note (Signed)
   Tobacco cessation has been discussed and encouraged.  A prescription has been provided for montelukast 10 mg daily at bedtime.  Continue albuterol HFA, 1-2 inhalations every 4-6 hours as needed.  I have also recommended taking albuterol 10-15 minutes prior to exercise.  Subjective and objective measures of pulmonary function will be followed and the treatment plan will be adjusted accordingly.

## 2017-02-19 IMAGING — CR DG CHEST 2V
2 series · 2 of 2 positions shown · non-contrast
Comparison: August 12, 2015 chest radiograph and August 20, 2015 chest CT

CLINICAL DATA: Chest pain and cough

EXAM:
CHEST  2 VIEW

[w chest pa]
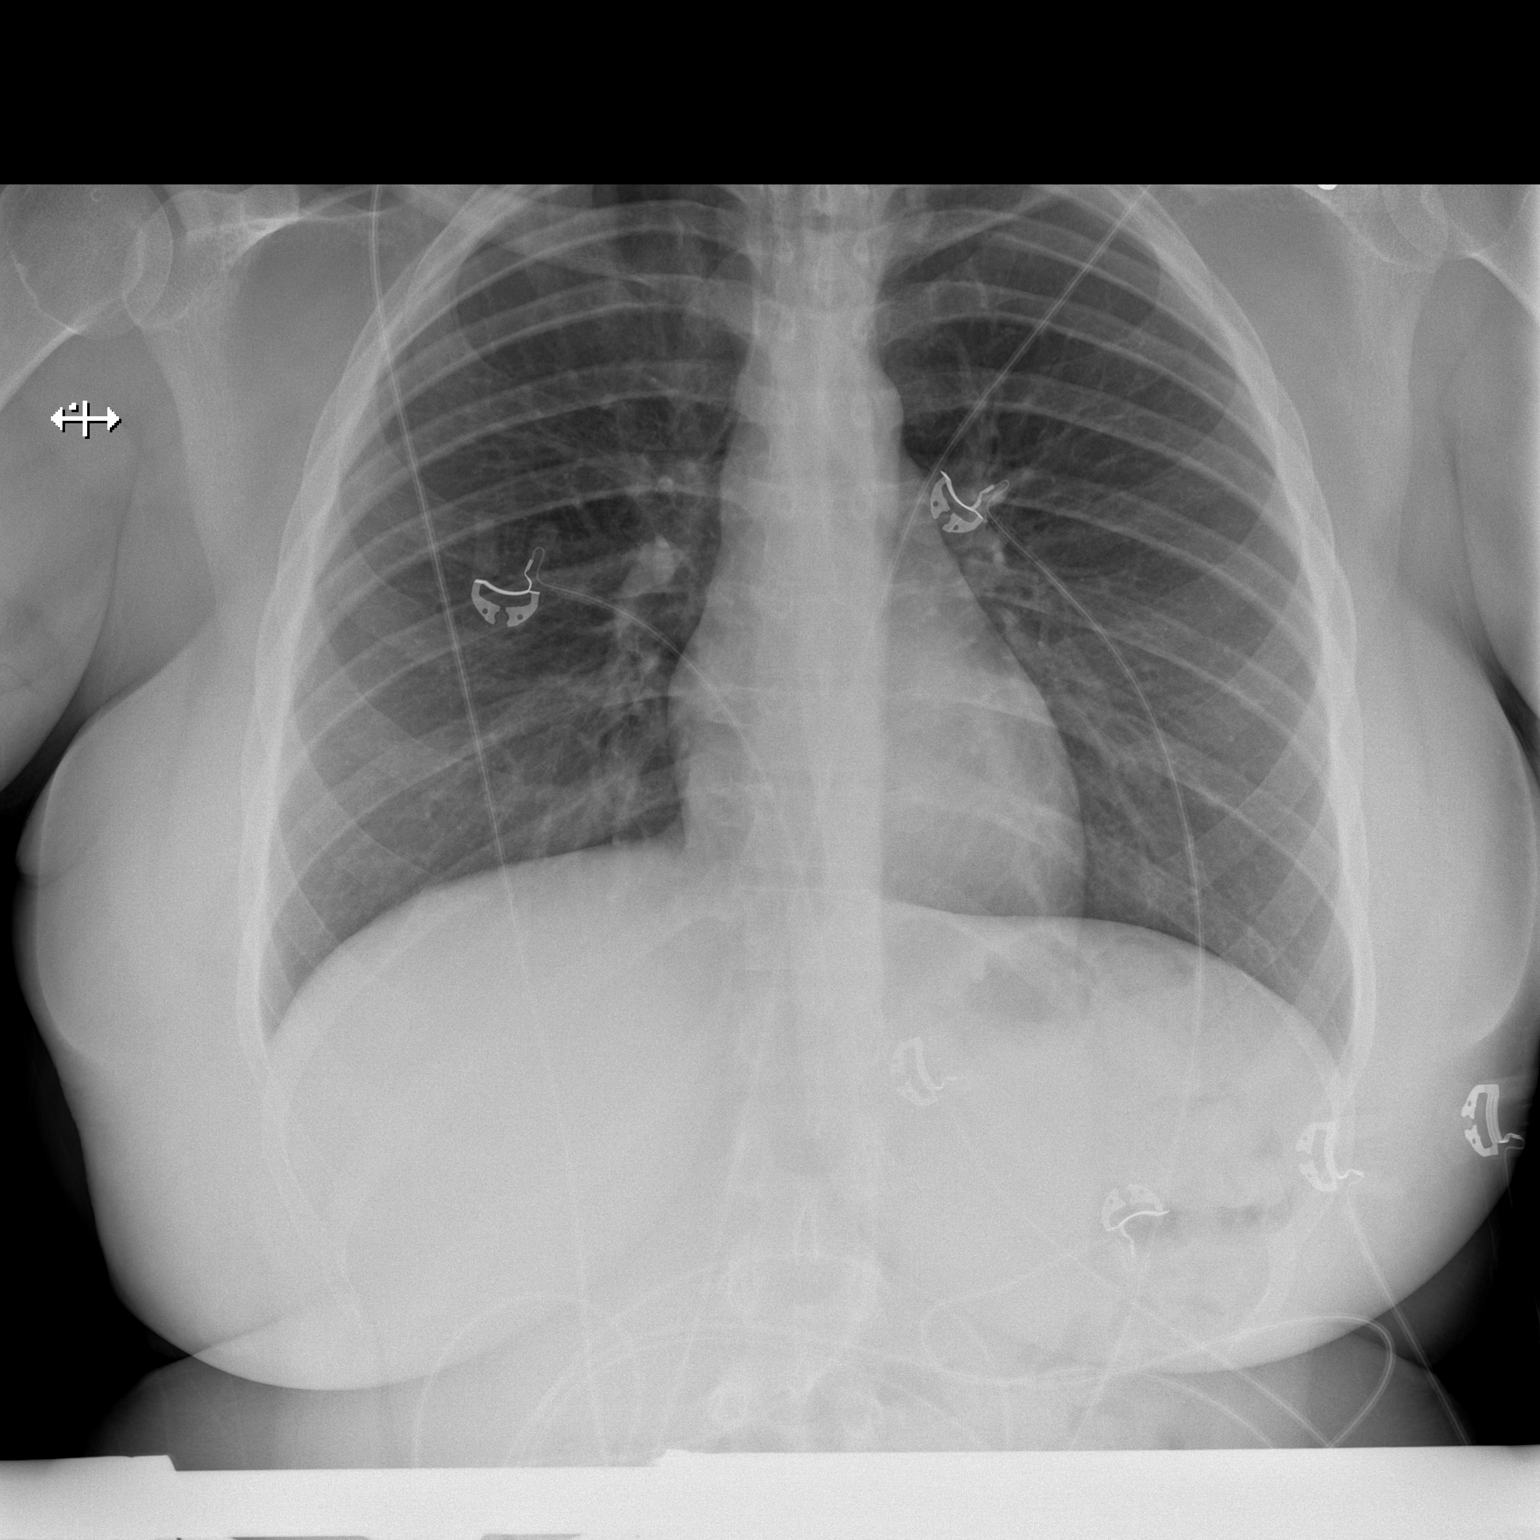

[w chest lat]
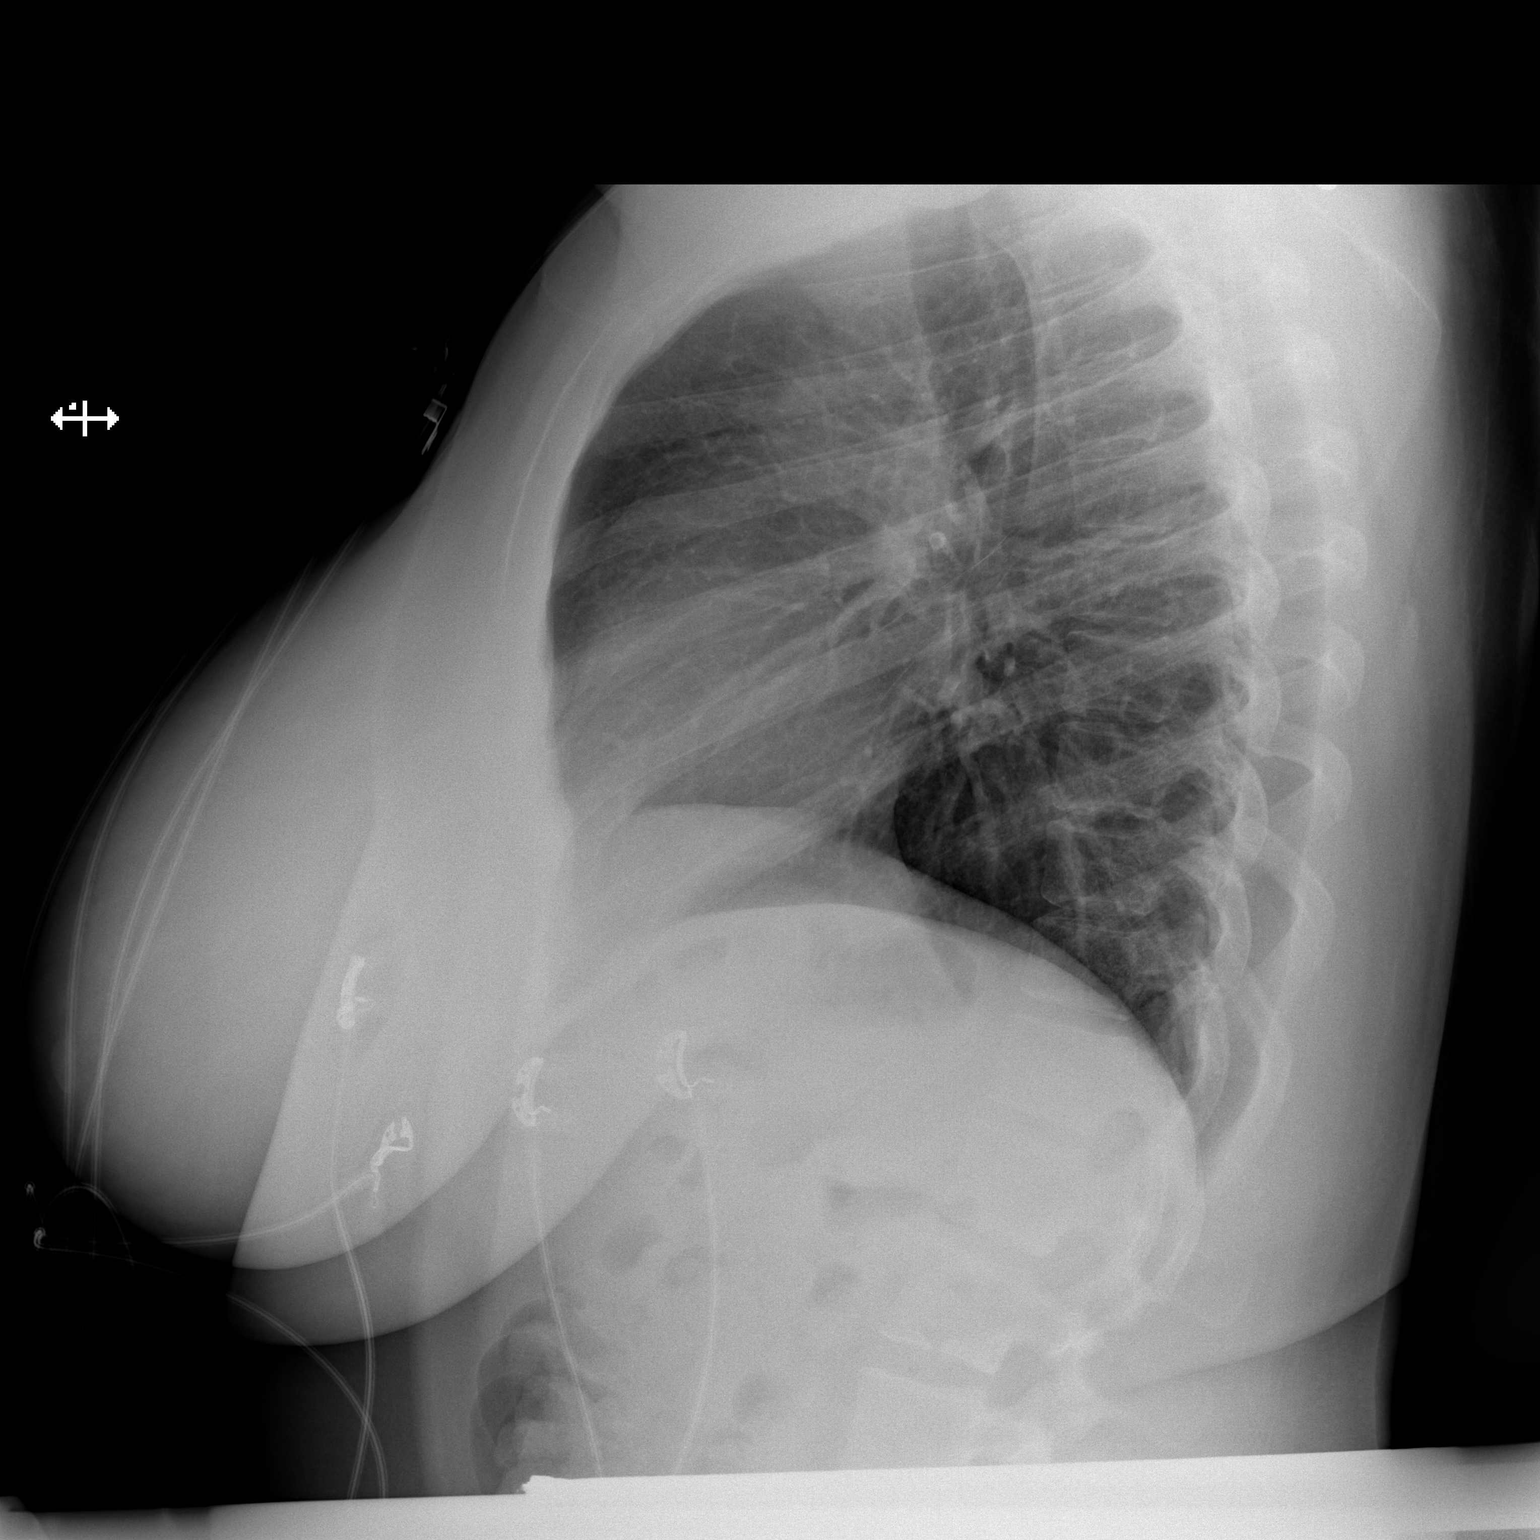

[2 of 2 positions shown; findings below may reference images not displayed]

FINDINGS: Lungs are clear. Heart size and pulmonary vascularity are normal. No
adenopathy. No pneumothorax. No bone lesions.
IMPRESSION: No edema or consolidation.

## 2017-02-25 IMAGING — CR DG CHEST 2V
2 series · 2 of 2 positions shown · non-contrast
Comparison: PA and lateral chest x-ray June 29, 2016

CLINICAL DATA: For right pain, meningitis. The patient has
increased throw pain when talking or swallowing. Recent sensation of
burning in the chest. No nausea, vomiting, or cough. Current smoker.

EXAM:
CHEST  2 VIEW

[w chest pa]
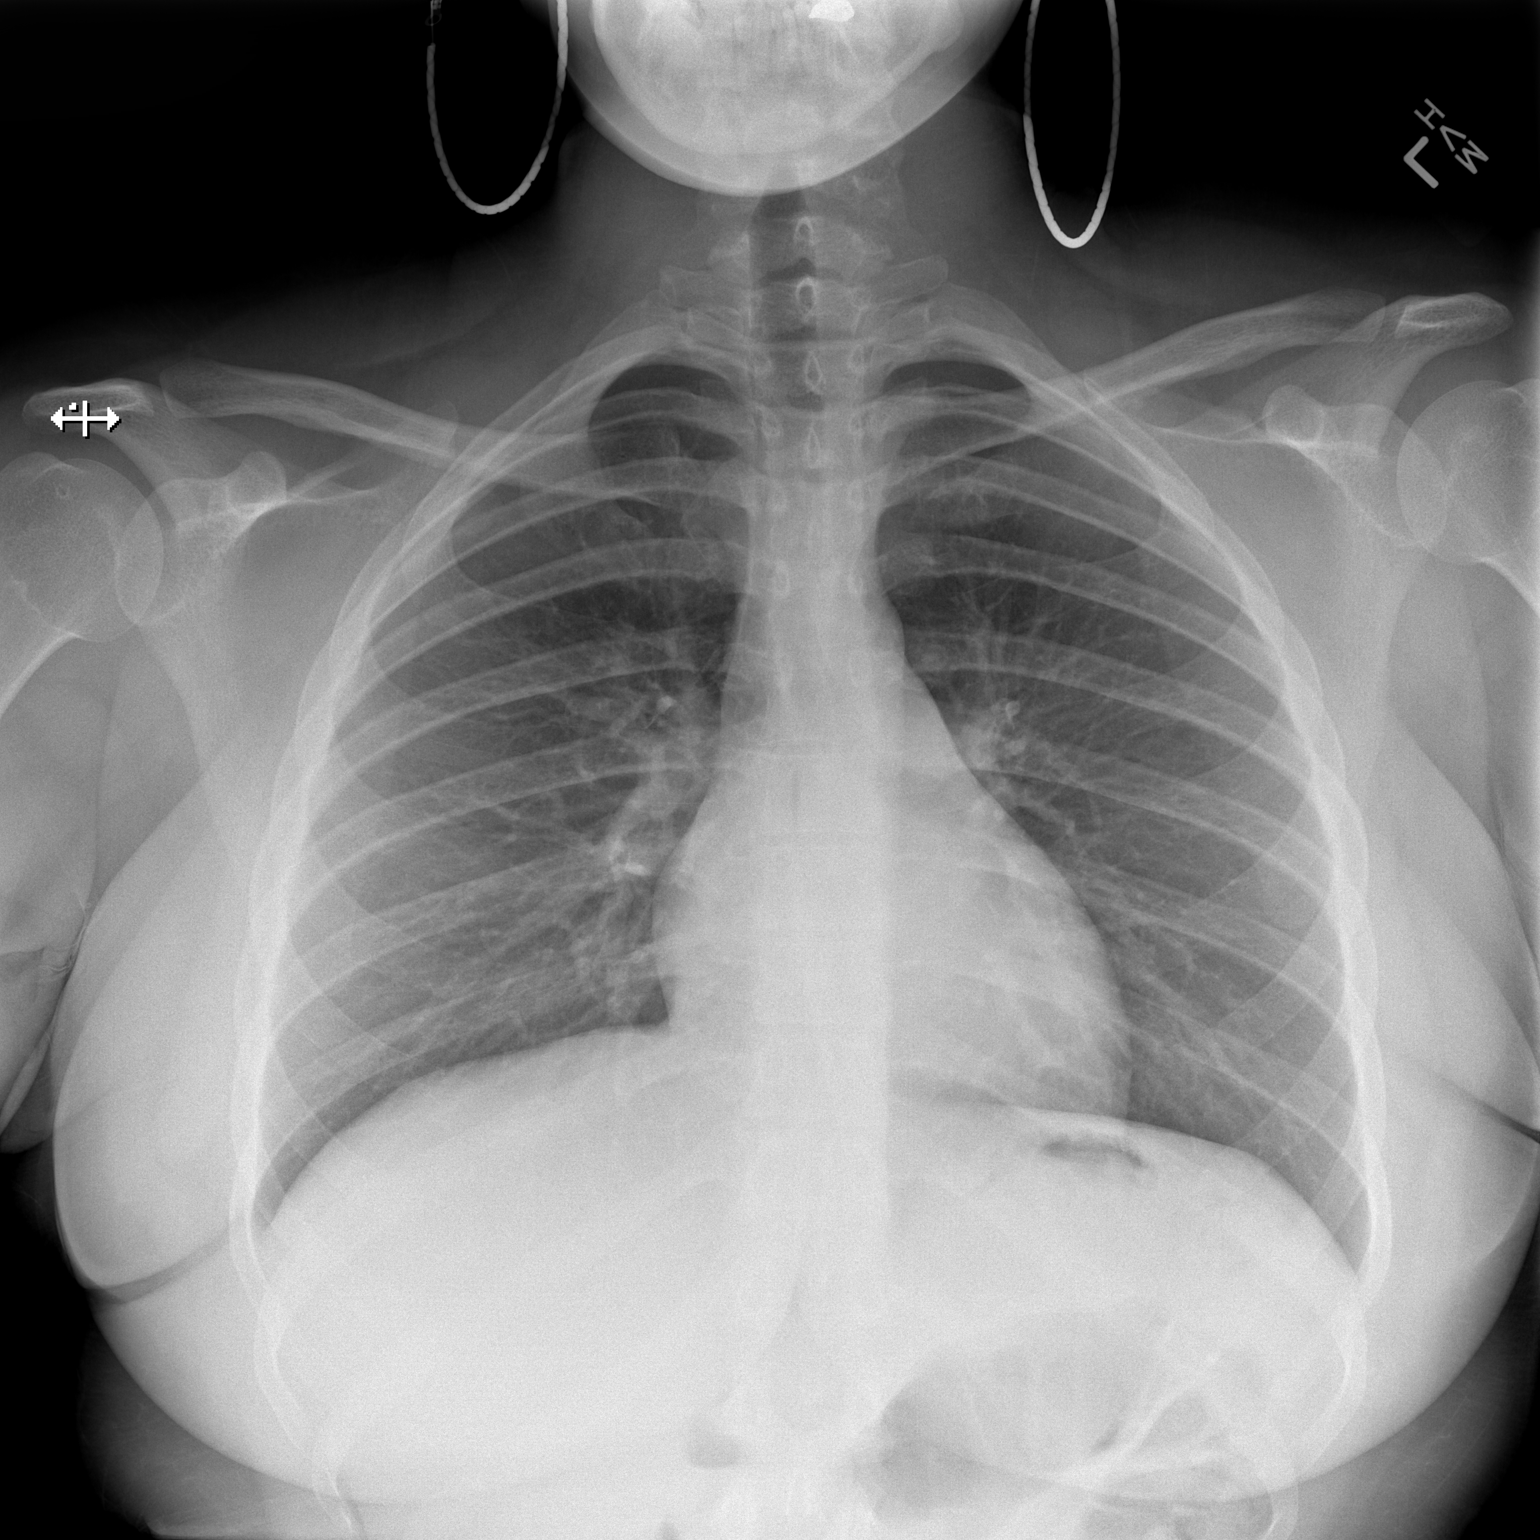

[w chest lat]
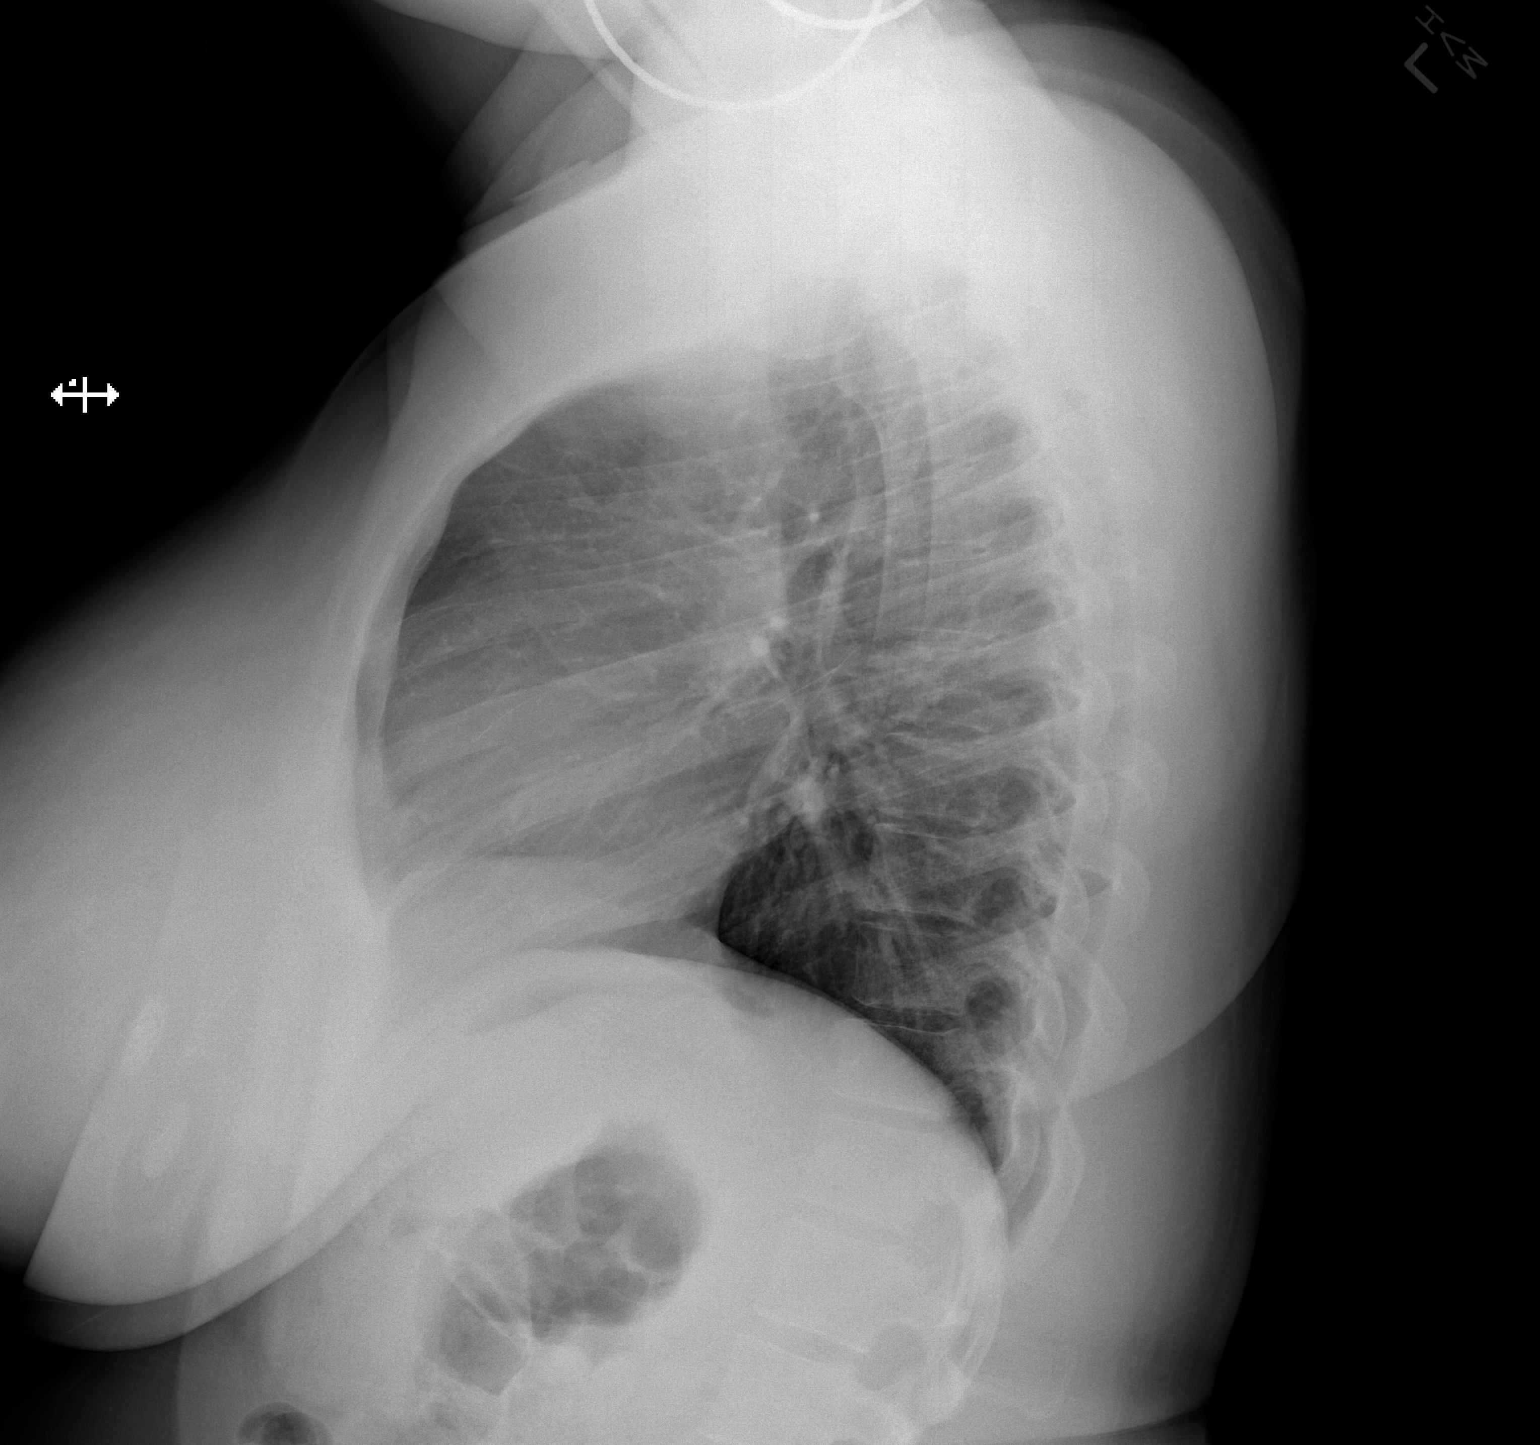

[2 of 2 positions shown; findings below may reference images not displayed]

FINDINGS: The lungs are adequately inflated and clear. The heart and pulmonary
vascularity are normal. The trachea is midline. The mediastinum is
normal in width. There is no pleural effusion. The bony thorax is
unremarkable.
IMPRESSION: There is no active cardiopulmonary disease.

## 2017-03-07 ENCOUNTER — Ambulatory Visit: Payer: No Typology Code available for payment source | Admitting: Internal Medicine

## 2017-03-07 ENCOUNTER — Ambulatory Visit: Payer: Self-pay | Admitting: Neurology

## 2017-03-07 DIAGNOSIS — Z029 Encounter for administrative examinations, unspecified: Secondary | ICD-10-CM

## 2017-03-15 ENCOUNTER — Encounter: Payer: Self-pay | Admitting: Neurology

## 2017-04-29 ENCOUNTER — Ambulatory Visit (INDEPENDENT_AMBULATORY_CARE_PROVIDER_SITE_OTHER): Payer: No Typology Code available for payment source | Admitting: Family Medicine

## 2017-04-29 VITALS — BP 138/100 | HR 102 | Temp 98.5°F | Wt 216.0 lb

## 2017-04-29 DIAGNOSIS — J020 Streptococcal pharyngitis: Secondary | ICD-10-CM

## 2017-04-29 DIAGNOSIS — J029 Acute pharyngitis, unspecified: Secondary | ICD-10-CM | POA: Diagnosis not present

## 2017-04-29 DIAGNOSIS — J040 Acute laryngitis: Secondary | ICD-10-CM

## 2017-04-29 LAB — POCT RAPID STREP A (OFFICE): RAPID STREP A SCREEN: POSITIVE — AB

## 2017-04-29 MED ORDER — PENICILLIN V POTASSIUM 500 MG PO TABS: 500.0000 mg | ORAL_TABLET | Freq: Three times a day (TID) | ORAL | 0 refills | Status: AC

## 2017-04-29 NOTE — Patient Instructions (Signed)
Antibiotic penicillin for 10 days.   Warm liquids, spoonful of honey, tylenol or ibuprofen can help.   Strep Throat Strep throat is an infection of the throat. It is caused by germs. Strep throat spreads from person to person because of coughing, sneezing, or close contact. Follow these instructions at home: Medicines  Take over-the-counter and prescription medicines only as told by your doctor.  Take your antibiotic medicine as told by your doctor. Do not stop taking the medicine even if you feel better.  Have family members who also have a sore throat or fever go to a doctor. Eating and drinking  Do not share food, drinking cups, or personal items.  Try eating soft foods until your sore throat feels better.  Drink enough fluid to keep your pee (urine) clear or pale yellow. General instructions  Rinse your mouth (gargle) with a salt-water mixture 3-4 times per day or as needed. To make a salt-water mixture, stir -1 tsp of salt into 1 cup of warm water.  Make sure that all people in your house wash their hands well.  Rest.  Stay home from school or work until you have been taking antibiotics for 24 hours.  Keep all follow-up visits as told by your doctor. This is important. Contact a doctor if:  Your neck keeps getting bigger.  You get a rash, cough, or earache.  You cough up thick liquid that is green, yellow-brown, or bloody.  You have pain that does not get better with medicine.  Your problems get worse instead of getting better.  You have a fever. Get help right away if:  You throw up (vomit).  You get a very bad headache.  You neck hurts or it feels stiff.  You have chest pain or you are short of breath.  You have drooling, very bad throat pain, or changes in your voice.  Your neck is swollen or the skin gets red and tender.  Your mouth is dry or you are peeing less than normal.  You keep feeling more tired or it is hard to wake up.  Your joints  are red or they hurt. This information is not intended to replace advice given to you by your health care provider. Make sure you discuss any questions you have with your health care provider. Document Released: 12/01/2007 Document Revised: 02/11/2016 Document Reviewed: 10/07/2014 Elsevier Interactive Patient Education  [employer]2018 Elsevier Inc.

## 2017-04-29 NOTE — Assessment & Plan Note (Addendum)
Rapid strep positive, treat with pcn v 500mg  TID for 10 days. Discussed supportive care at home with warm liquids, honey, OTC tylenol/motrin. Patient given return precautions.

## 2017-04-29 NOTE — Progress Notes (Signed)
    Subjective:  Jessica Knapp is a 26 y.o. female who [facility] with a chief complaint of sore throat.   HPI:  Sore throat - Cold like symptoms starting 3 weeks, got better from that but continued to have sore throat and has lost her voice - Has had laryngitis a lot in the past but this episode is very long lasting which is unusual for her - Very tired with body aches - does have occasional nonproductive cough - no fever/chills, No vomiting or diarrhea. - no ear pain. No SOB or wheezing  ROS: Per HPI  Objective:  Physical Exam: BP (!) 138/100   Pulse (!) 102   Temp 98.5 F (36.9 C) (Oral)   Wt 216 lb (98 kg)   SpO2 99%   BMI 36.50 kg/m   Gen: NAD, resting comfortably HEENT: [place], AT. TM's pearly with good light reflex bilaterally. Oropharynx erythematous but no exudates Neck: mild cervical lymphadenopathy CV: RRR with no murmurs appreciated Pulm: NWOB, CTAB with no crackles, wheezes, or rhonchi GI: Normal bowel sounds present. Soft, Nontender, Nondistended. MSK: no edema, cyanosis, or clubbing noted Skin: warm, dry Neuro: grossly normal, moves all extremities Psych: Normal affect and thought content  Results for orders placed or performed in visit on 04/29/17 (from the past 72 hour(s))  POCT rapid strep A     Status: Abnormal   Collection Time: 04/29/17  1:55 PM  Result Value Ref Range   Rapid Strep A Screen Positive (A) Negative     Assessment/Plan:  Streptococcal sore throat Rapid strep positive, treat with pcn v 500mg  TID for 10 days. Discussed supportive care at home with warm liquids, honey, OTC tylenol/motrin. Patient given return precautions.   [name], DO PGY-2, [facility] 04/29/2017 2:46 PM

## 2017-05-02 ENCOUNTER — Telehealth: Payer: Self-pay | Admitting: *Deleted

## 2017-05-02 DIAGNOSIS — J069 Acute upper respiratory infection, unspecified: Secondary | ICD-10-CM

## 2017-05-02 MED ORDER — BENZONATATE 100 MG PO CAPS: 100.0000 mg | ORAL_CAPSULE | Freq: Three times a day (TID) | ORAL | 0 refills | Status: DC | PRN

## 2017-05-02 NOTE — Telephone Encounter (Signed)
Patient left message on nurse line stating that at last visit she was under the impression she would be getting a cough medication as well as the antibiotic. Wants to know if MD could call in a cough medicine or something that will help soothe her throat since nothing over the counter is helping

## 2017-05-02 NOTE — Telephone Encounter (Signed)
Rx for tessalon sent to pharmacy.   °

## 2017-05-03 ENCOUNTER — Ambulatory Visit (INDEPENDENT_AMBULATORY_CARE_PROVIDER_SITE_OTHER): Payer: No Typology Code available for payment source | Admitting: Internal Medicine

## 2017-05-03 DIAGNOSIS — J020 Streptococcal pharyngitis: Secondary | ICD-10-CM

## 2017-05-03 MED ORDER — HYDROCODONE-HOMATROPINE 5-1.5 MG/5ML PO SYRP: 5.0000 mL | ORAL_SOLUTION | Freq: Three times a day (TID) | ORAL | 0 refills | Status: DC | PRN

## 2017-05-03 NOTE — Assessment & Plan Note (Signed)
Patient with known diagnosis of strep throat on day 5/10 of PCN course. Presenting again today with no improvement in symptoms despite taking abx and Tessalon perles. As patient is unable to sleep at night due to coughing despite taking OTC meds and Tessalon perles, feel that Hycodan is indicated. Discussed with patient that this will make her very sleepy and should only be taken at night. Can continue to take Tessalon perles during day, but advised patient not to take the two together. Ibuprofen, tea w/honey and lemon PRN as well. Discussed importance of completing abx course.

## 2017-05-03 NOTE — Progress Notes (Signed)
   Subjective:   Patient: Jessica Knapp       Birthdate: 04/23/1991       MRN: [id number]      HPI  Jessica Knapp is a 26 y.o. female presenting for walk in appt for sore throat and cough.   Cough, sore throat Patient [facility] She was diagnosed with strep throat after positive rapid strep and started on 10d course of PCN. Was prescribed Tessalon perles yesterday after calling in to say that her symptoms had not improved. Patient says she has been taking PCN daily as prescribed and has been taking Tessalon perles but has had no improvement in symptoms. Said that she is coughing so much that she cannot sleep at night. She says if she is able to fall asleep briefly, she wakes up feeling as if her throat is on fire. Denies any sputum production when coughing. Endorses subjective fevers but has not measured temperature. Has been drinking tea with lemon and honey with no improvement. Has also been taking Benadryl and OTC cold and sinus medications with no improvement. Has normal appetite and is drinking well.   Smoking status reviewed. Patient is current some day smoker.   Review of Systems See HPI.     Objective:  Physical Exam  Constitutional: She is oriented to person, place, and time and well-developed, well-nourished, and in no distress.  Hoarse voice  HENT:  Head: Normocephalic and atraumatic.  Oropharyngeal erythema, no exudates. MMM.   Cardiovascular: Normal rate, regular rhythm and normal heart sounds.  No murmur heard. Pulmonary/Chest: Effort normal and breath sounds normal. No respiratory distress. She has no wheezes. She has no rales.  Neurological: She is alert and oriented to person, place, and time.  Skin: Skin is warm and dry.  Psychiatric: Affect and judgment normal.      Assessment & Plan:  Streptococcal sore throat Patient with known diagnosis of strep throat on day 5/10 of PCN course. Presenting again today with no improvement in symptoms despite  taking abx and Tessalon perles. As patient is unable to sleep at night due to coughing despite taking OTC meds and Tessalon perles, feel that Hycodan is indicated. Discussed with patient that this will make her very sleepy and should only be taken at night. Can continue to take Tessalon perles during day, but advised patient not to take the two together. Ibuprofen, tea w/honey and lemon PRN as well. Discussed importance of completing abx course.   Precepted with Dr. Leveda AnnaHensel.   Tarri AbernethyAbigail J Lancaster, MD, MPH PGY-3 [facility] Pager [phone]

## 2017-05-03 NOTE — Patient Instructions (Signed)
It was nice meeting you today Jessica Knapp!  Please continue taking penicillin until you complete the full 10 day course.   For coughing during the day, you can continue to take one Tessalon perle up to every 8 hours as needed. For coughing at night, you can take the Hycodan cough syrup I have prescribed today. This will make you very sleepy, so you should not take it during the day.   Be sure you are drinking plenty of water. You can also continue to drink tea with lemon and honey. Taking ibuprofen can also help with your throat pain.   If you have any questions or concerns, please feel free to call the clinic.   Be well,  Dr. Natale MilchLancaster

## 2017-05-13 ENCOUNTER — Other Ambulatory Visit: Payer: Self-pay

## 2017-05-13 ENCOUNTER — Encounter: Payer: Self-pay | Admitting: Family Medicine

## 2017-05-13 ENCOUNTER — Ambulatory Visit (INDEPENDENT_AMBULATORY_CARE_PROVIDER_SITE_OTHER): Payer: No Typology Code available for payment source | Admitting: Family Medicine

## 2017-05-13 VITALS — BP 106/60 | HR 88 | Temp 98.3°F | Ht 64.0 in | Wt 212.0 lb

## 2017-05-13 DIAGNOSIS — J029 Acute pharyngitis, unspecified: Secondary | ICD-10-CM

## 2017-05-13 DIAGNOSIS — R112 Nausea with vomiting, unspecified: Secondary | ICD-10-CM | POA: Diagnosis not present

## 2017-05-13 DIAGNOSIS — E860 Dehydration: Secondary | ICD-10-CM | POA: Diagnosis not present

## 2017-05-13 LAB — POCT MONO (EPSTEIN BARR VIRUS): Mono, POC: NEGATIVE

## 2017-05-13 MED ORDER — HYDROCODONE-HOMATROPINE 5-1.5 MG/5ML PO SYRP: 5.0000 mL | ORAL_SOLUTION | Freq: Three times a day (TID) | ORAL | 0 refills | Status: DC | PRN

## 2017-05-13 MED ORDER — ONDANSETRON 4 MG PO TBDP
4.0000 mg | ORAL_TABLET | Freq: Three times a day (TID) | ORAL | 0 refills | Status: DC | PRN
Start: 2017-05-13 — End: 2017-09-29

## 2017-05-13 MED ORDER — AMOXICILLIN-POT CLAVULANATE 875-125 MG PO TABS
1.0000 | ORAL_TABLET | Freq: Two times a day (BID) | ORAL | 0 refills | Status: DC
Start: 2017-05-13 — End: 2017-10-04

## 2017-05-13 NOTE — Progress Notes (Signed)
    Subjective:    Patient ID: Jessica Knapp, female    DOB: 07/12/1990, 26 y.o.   MRN: [id number]   CC: sore throat, ear pain, red eye  Has felt unwell for past 3 weeks. She had strep throat 1 week ago and took 10 days of antibiotics, does endorse she felt better but only after 10 days abx (11/12). Then developed ear pain 2-3 days later. Thinks roommate has ear infection as well.   She states both ears hurt, she has a sore throat, her left eye is red. She is coughing but not bringing anything up. She had some clear mucous yesterday when clearing her throat. She reports chills at home, no documented fevers. She endorses nausea and vomiting, unable to keep anything down except water. She feels lightheaded at times and feels the room is spinning.   Has taken tessalon perles w/ some relief in cough, has tried ibuprofen w/ no relief in pain. Out of cough syrup.   Smoking status reviewed- current smoker  Review of Systems- as above, additionally no constipation or diarrhea   Objective:  BP 106/60   Pulse 88   Temp 98.3 F (36.8 C) (Oral)   Ht 5\' 4"  (1.626 m)   Wt 212 lb (96.2 kg)   LMP 04/13/2017 (Approximate)   SpO2 99%   BMI 36.39 kg/m  Vitals and nursing note reviewed  General: well nourished, in no acute distress, appears tired but non-toxic HEENT: normocephalic, TM's visualized bilaterally with left TM full- no effusion or exudate seen, no scleral icterus or conjunctival pallor but left eye with some minor subconjunctival hemorrhage, no nasal discharge, boggy nasal turbinates bilaterally, moist mucous membranes, petechia appreciated on palate. Mild erythema in posterior oropharynx, no tonsillar exudate Neck: supple, tender, anterior chain LAD present Cardiac: RRR, clear S1 and S2, no murmurs, rubs, or gallops Respiratory: clear to auscultation bilaterally, no increased work of breathing Abdomen: obese abdomen, soft, non distended. Tender to palpation over LUQ, unable to appreciate  organomegaly or masses Extremities: no edema or cyanosis. Skin: warm and dry, no rashes noted Neuro: alert and oriented, no focal deficits  Assessment & Plan:    Sore throat  Overall patient has felt unwell for past 3 weeks, initially dx w/ strep throat w/ positive strep swab however did not improve with antibiotics. She has now worsened again. She is well appearing overall but does have signs of upper respiratory infection. Would like to rule out mono.  -monospot negative -rx for augmentin given for 7 days -strict return precautions discussed, asked patient to follow up if not well by Monday/tuesday of next week so she can be seen before clinic closed for holidays -would consider checking HIV in future  -refilled cough syrup, zofran given for nausea -continue allergy medications -Patient verbalized understanding and agreement with plan    Return if symptoms worsen or fail to improve.   Dolores PattyAngela Dillard Pascal, DO Family Medicine Resident PGY-2

## 2017-05-13 NOTE — Patient Instructions (Signed)
   It was great seeing you today!  Please return to be seen if your symptoms worsen or if you develop worsening fevers.   If you have questions or concerns please do not hesitate to call at [phone].  Dolores PattyAngela Derron Pipkins, DO PGY-2, [facility] 05/13/2017 3:36 PM    Upper Respiratory Infection, Adult Most upper respiratory infections (URIs) are caused by a virus. A URI affects the nose, throat, and upper air passages. The most common type of URI is often called "the common cold." Follow these instructions at home:  Take medicines only as told by your doctor.  Gargle warm saltwater or take cough drops to comfort your throat as told by your doctor.  Use a warm mist humidifier or inhale steam from a shower to increase air moisture. This may make it easier to breathe.  Drink enough fluid to keep your pee (urine) clear or pale yellow.  Eat soups and other clear broths.  Have a healthy diet.  Rest as needed.  Go back to work when your fever is gone or your doctor says it is okay. ? You may need to stay home longer to avoid giving your URI to others. ? You can also wear a face mask and wash your hands often to prevent spread of the virus.  Use your inhaler more if you have asthma.  Do not use any tobacco products, including cigarettes, chewing tobacco, or electronic cigarettes. If you need help quitting, ask your doctor. Contact a doctor if:  You are getting worse, not better.  Your symptoms are not helped by medicine.  You have chills.  You are getting more short of breath.  You have brown or red mucus.  You have yellow or brown discharge from your nose.  You have pain in your face, especially when you bend forward.  You have a fever.  You have puffy (swollen) neck glands.  You have pain while swallowing.  You have white areas in the back of your throat. Get help right away if:  You have very bad or constant: ? Headache. ? Ear pain. ? Pain in  your forehead, behind your eyes, and over your cheekbones (sinus pain). ? Chest pain.  You have long-lasting (chronic) lung disease and any of the following: ? Wheezing. ? Long-lasting cough. ? Coughing up blood. ? A change in your usual mucus.  You have a stiff neck.  You have changes in your: ? Vision. ? Hearing. ? Thinking. ? Mood. This information is not intended to replace advice given to you by your health care provider. Make sure you discuss any questions you have with your health care provider. Document Released: 12/01/2007 Document Revised: 02/15/2016 Document Reviewed: 09/19/2013 Elsevier Interactive Patient Education  2018 [employer]

## 2017-05-14 DIAGNOSIS — J029 Acute pharyngitis, unspecified: Secondary | ICD-10-CM | POA: Insufficient documentation

## 2017-05-14 NOTE — Assessment & Plan Note (Addendum)
  Overall patient has felt unwell for past 3 weeks, initially dx w/ strep throat w/ positive strep swab however did not improve with antibiotics. She has now worsened again. She is well appearing overall but does have signs of upper respiratory infection. Would like to rule out mono.  -monospot negative -rx for augmentin given for 7 days -strict return precautions discussed, asked patient to follow up if not well by Monday/tuesday of next week so she can be seen before clinic closed for holidays -would consider checking HIV in future  -refilled cough syrup, zofran given for nausea -continue allergy medications -Patient verbalized understanding and agreement with plan

## 2017-05-20 ENCOUNTER — Emergency Department (HOSPITAL_COMMUNITY)
Admission: EM | Admit: 2017-05-20 | Discharge: 2017-05-20 | Disposition: A | Discharge status: Home / Self Care | Payer: No Typology Code available for payment source | Attending: Emergency Medicine | Admitting: Emergency Medicine

## 2017-05-20 ENCOUNTER — Emergency Department (HOSPITAL_COMMUNITY): Payer: No Typology Code available for payment source

## 2017-05-20 ENCOUNTER — Other Ambulatory Visit: Payer: Self-pay

## 2017-05-20 ENCOUNTER — Encounter (HOSPITAL_COMMUNITY): Payer: Self-pay

## 2017-05-20 DIAGNOSIS — J4521 Mild intermittent asthma with (acute) exacerbation: Secondary | ICD-10-CM | POA: Insufficient documentation

## 2017-05-20 DIAGNOSIS — Z79899 Other long term (current) drug therapy: Secondary | ICD-10-CM | POA: Diagnosis not present

## 2017-05-20 DIAGNOSIS — K529 Noninfective gastroenteritis and colitis, unspecified: Secondary | ICD-10-CM | POA: Diagnosis not present

## 2017-05-20 DIAGNOSIS — F17210 Nicotine dependence, cigarettes, uncomplicated: Secondary | ICD-10-CM | POA: Diagnosis not present

## 2017-05-20 DIAGNOSIS — R112 Nausea with vomiting, unspecified: Secondary | ICD-10-CM | POA: Diagnosis present

## 2017-05-20 LAB — COMPREHENSIVE METABOLIC PANEL
ALK PHOS: 83 U/L (ref 38–126)
ALT: 83 U/L — AB (ref 14–54)
AST: 40 U/L (ref 15–41)
Albumin: 4.4 g/dL (ref 3.5–5.0)
Anion gap: 10 (ref 5–15)
BILIRUBIN TOTAL: 0.3 mg/dL (ref 0.3–1.2)
BUN: 13 mg/dL (ref 6–20)
CALCIUM: 9.9 mg/dL (ref 8.9–10.3)
CO2: 23 mmol/L (ref 22–32)
CREATININE: 0.98 mg/dL (ref 0.44–1.00)
Chloride: 105 mmol/L (ref 101–111)
GFR calc Af Amer: >60 mL/min (ref >60)
Glucose, Bld: 105 mg/dL — ABNORMAL HIGH (ref 65–99)
POTASSIUM: 4 mmol/L (ref 3.5–5.1)
Sodium: 138 mmol/L (ref 135–145)
TOTAL PROTEIN: 8.4 g/dL — AB (ref 6.5–8.1)

## 2017-05-20 LAB — URINALYSIS, ROUTINE W REFLEX MICROSCOPIC
BILIRUBIN URINE: NEGATIVE
Bacteria, UA: NONE SEEN
Glucose, UA: NEGATIVE mg/dL
HGB URINE DIPSTICK: NEGATIVE
Ketones, ur: 20 mg/dL — AB
LEUKOCYTES UA: NEGATIVE
NITRITE: NEGATIVE
PH: 7 (ref 5.0–8.0)
Protein, ur: 30 mg/dL — AB
SPECIFIC GRAVITY, URINE: 1.028 (ref 1.005–1.030)

## 2017-05-20 LAB — I-STAT BETA HCG BLOOD, ED (MC, WL, AP ONLY): I-stat hCG, quantitative: <5 m[IU]/mL (ref <5)

## 2017-05-20 LAB — CBC
HCT: 41.2 % (ref 36.0–46.0)
Hemoglobin: 14.1 g/dL (ref 12.0–15.0)
MCH: 32.9 pg (ref 26.0–34.0)
MCHC: 34.2 g/dL (ref 30.0–36.0)
MCV: 96.3 fL (ref 78.0–100.0)
PLATELETS: 400 10*3/uL (ref 150–400)
RBC: 4.28 MIL/uL (ref 3.87–5.11)
RDW: 12.1 % (ref 11.5–15.5)
WBC: 9.1 10*3/uL (ref 4.0–10.5)

## 2017-05-20 LAB — LIPASE, BLOOD: Lipase: 31 U/L (ref 11–51)

## 2017-05-20 MED ORDER — IOPAMIDOL (ISOVUE-300) INJECTION 61%
30.0000 mL | Freq: Once | INTRAVENOUS | Status: AC | PRN
  Administered 2017-05-20: 100 mL via INTRAVENOUS
  Administered 2017-05-20: 30 mL via ORAL

## 2017-05-20 MED ORDER — PROMETHAZINE HCL 25 MG PO TABS: 25.0000 mg | ORAL_TABLET | Freq: Four times a day (QID) | ORAL | 0 refills | Status: DC | PRN

## 2017-05-20 MED ORDER — SODIUM CHLORIDE 0.9 % IV BOLUS (SEPSIS)
1000.0000 mL | Freq: Once | INTRAVENOUS | Status: AC
  Administered 2017-05-20: 1000 mL via INTRAVENOUS

## 2017-05-20 MED ORDER — IOPAMIDOL (ISOVUE-300) INJECTION 61%
INTRAVENOUS | Status: AC
  Administered 2017-05-20: 100 mL via INTRAVENOUS
  Filled 2017-05-20: qty 100

## 2017-05-20 MED ORDER — IOPAMIDOL (ISOVUE-300) INJECTION 61%
INTRAVENOUS | Status: AC
  Filled 2017-05-20: qty 30

## 2017-05-20 MED ORDER — PROMETHAZINE HCL 25 MG/ML IJ SOLN
12.5000 mg | Freq: Once | INTRAMUSCULAR | Status: AC
  Administered 2017-05-20: 12.5 mg via INTRAVENOUS
  Filled 2017-05-20: qty 1

## 2017-05-20 MED ORDER — KETOROLAC TROMETHAMINE 15 MG/ML IJ SOLN
15.0000 mg | Freq: Once | INTRAMUSCULAR | Status: AC
  Administered 2017-05-20: 15 mg via INTRAVENOUS
  Filled 2017-05-20: qty 1

## 2017-05-20 NOTE — Discharge Instructions (Signed)
Please read the attached information regarding your condition. Take Phenergan as needed for nausea and vomiting. Follow-up with your primary care provider for further evaluation. Return to ED for worsening or severe abdominal pain, increased vomiting, blood in vomit, lightheadedness or loss of consciousness.

## 2017-05-20 NOTE — ED Triage Notes (Signed)
Patient states she has been having vomiting x 2 weeks and mid and right abdominal pain x 3 days.

## 2017-05-20 NOTE — ED Provider Notes (Signed)
[facility]-EMERGENCY DEPT Provider Note   CSN: [id number] Arrival date & time: 05/20/17  1557     History   Chief Complaint Chief Complaint  Patient presents with  . Abdominal Pain  . Emesis    HPI Gracy BruinsShayla Ozburn is a 26 y.o. female with a past medical history of asthma, GERD, migraines, who presents to ED for evaluation of 2 week history of NBNB vomiting, nausea, mid-right abdominal pain that has been intermittent. She has not been feeling well for the past month, which her primary care provider has attributed to strep throat originally and then a viral URI. She has been taking Zofran at home with no improvement in her nausea or vomiting. She is currently on her menstrual cycle but states that the abdominal pain feels different than her usual cramps. She denies any vaginal discharge, urinary complaints, recent travel, constipation, diarrhea, blood in stool.  HPI  Past Medical History:  Diagnosis Date  . Asthma   . Chronic headaches   . GERD (gastroesophageal reflux disease)   . Recurrent upper respiratory infection (URI)   . Urticaria     Patient Active Problem List   Diagnosis Date Noted  . Sore throat 05/14/2017  . Streptococcal sore throat 04/29/2017  . Seasonal and perennial allergic rhinitis 01/25/2017  . Allergic conjunctivitis 01/25/2017  . Mild persistent asthma 01/25/2017  . 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 Allergic urticaria 01/25/2017  . Laryngitis 12/13/2016  . URI (upper respiratory infection) 12/06/2016  . Amenorrhea 11/11/2016  . Frequently sick 08/13/2016  . Carpal tunnel syndrome 08/13/2016  . Migraines 08/13/2016  . Cigarette smoker 09/08/2015  . Dyspnea 08/19/2015  . Upper airway cough syndrome 08/14/2015    Past Surgical History:  Procedure Laterality Date  . FINGER SURGERY    . HAND SURGERY      OB History    No data available       Home Medications    Prior to Admission medications   Medication Sig Start  Date End Date Taking? Authorizing Provider  albuterol (PROVENTIL HFA;VENTOLIN HFA) 108 (90 Base) MCG/ACT inhaler Inhale 2 puffs into the lungs every 6 (six) hours as needed for wheezing or shortness of breath. 12/06/16  Yes Mikell, Antionette PolesAsiyah Zahra, MD  APRI 0.15-30 MG-MCG tablet Take 1 tablet by mouth daily. 02/26/17  Yes [provider]  Azelastine HCl 0.15 % SOLN Place 2 sprays into both nostrils 2 (two) times daily. 01/25/17  Yes Bobbitt, Heywood Ilesalph Carter, MD  benzonatate (TESSALON) 100 MG capsule Take 1 capsule (100 mg total) 3 (three) times daily as needed by mouth for cough. 05/02/17  Yes Jeneen RinksYoo, Elsia J, DO  eletriptan (RELPAX) 40 MG tablet Take 1 tablet earliest onset of migraine.  May repeat once in 2 hours if headache persists or recurs. 12/21/16  Yes Jaffe, Adam R, DO  famotidine (PEPCID) 20 MG tablet Take 1 tablet (20 mg total) by mouth at bedtime. Patient taking differently: Take 20 mg by mouth daily as needed for heartburn.  08/12/16  Yes Fitzgerald, Chrisandra NettersHillary Moen, MD  HYDROcodone-homatropine [facility](HYCODAN) 5-1.5 MG/5ML syrup Take 5 mLs every 8 (eight) hours as needed by mouth for cough. 05/13/17  Yes Tillman Sersiccio, Angela C, DO  levocetirizine (XYZAL) 5 MG tablet Take 1 tablet (5 mg total) by mouth every evening. 01/25/17  Yes Bobbitt, Heywood Ilesalph Carter, MD  montelukast (SINGULAIR) 10 MG tablet Take 1 tablet (10 mg total) by mouth at bedtime. 01/25/17  Yes Bobbitt, Heywood Ilesalph Carter, MD  nortriptyline [facility](PAMELOR) 25  MG capsule Take 1 capsule (25 mg total) by mouth at bedtime. 12/21/16  Yes Jaffe, Adam R, DO  Olopatadine HCl (PATADAY) 0.2 % SOLN Place 1 drop into both eyes 1 day or 1 dose. 01/25/17  Yes Bobbitt, Heywood Iles, MD  ondansetron (ZOFRAN ODT) 4 MG disintegrating tablet Take 1 tablet (4 mg total) every 8 (eight) hours as needed by mouth for nausea or vomiting. 05/13/17  Yes Riccio, Marylene Land C, DO  amoxicillin-clavulanate (AUGMENTIN) 875-125 MG tablet Take 1 tablet 2 (two) times daily by mouth. Patient not taking:  Reported on 05/20/2017 05/13/17   Tillman Sers, DO  fluticasone (FLONASE) 50 MCG/ACT nasal spray Place 2 sprays into both nostrils daily. Patient not taking: Reported on 05/20/2017 12/13/16   Berton Bon, MD  norethindrone (MICRONOR,CAMILA,ERRIN) 0.35 MG tablet Take 1 tablet (0.35 mg total) by mouth daily. Patient not taking: Reported on 05/20/2017 09/16/16   Casey Burkitt, MD  promethazine (PHENERGAN) 25 MG tablet Take 1 tablet (25 mg total) by mouth every 6 (six) hours as needed for nausea or vomiting. 05/20/17   Dietrich Pates, PA-C    Family History Family History  Problem Relation Age of Onset  . Asthma Maternal Grandfather   . Asthma Brother   . Heart disease Maternal Grandmother     Social History Social History   Tobacco Use  . Smoking status: Current Some Day Smoker    Packs/day: 0.03    Years: 1.00    Pack years: 0.03    Types: Cigarettes  . Smokeless tobacco: Never Used  Substance Use Topics  . Alcohol use: No    Alcohol/week: 0.0 oz  . Drug use: No     Allergies   Apple; Banana; Peanut-containing drug products; Pear; and Plum pulp   Review of Systems Review of Systems  Constitutional: Negative for appetite change, chills and fever.  HENT: Negative for ear pain, rhinorrhea, sneezing and sore throat.   Eyes: Negative for photophobia and visual disturbance.  Respiratory: Negative for cough, chest tightness, shortness of breath and wheezing.   Cardiovascular: Negative for chest pain and palpitations.  Gastrointestinal: Positive for abdominal pain, nausea and vomiting. Negative for blood in stool, constipation and diarrhea.  Genitourinary: Negative for dysuria, hematuria and urgency.  Musculoskeletal: Negative for myalgias.  Skin: Negative for rash.  Neurological: Negative for dizziness, weakness, light-headedness and headaches.     Physical Exam Updated Vital Signs BP 130/80   Pulse 75   Temp 98.1 F (36.7 C) (Oral)   Resp 18   Ht  5\' 4"  (1.626 m)   Wt 93 kg (205 lb)   LMP 05/20/2017   SpO2 100%   BMI 35.19 kg/m   Physical Exam  Constitutional: She appears well-developed and well-nourished. No distress.  HENT:  Head: Normocephalic and atraumatic.  Nose: Nose normal.  Eyes: Conjunctivae and EOM are normal. Left eye exhibits no discharge. No scleral icterus.  Neck: Normal range of motion. Neck supple.  Cardiovascular: Normal rate, regular rhythm, normal heart sounds and intact distal pulses. Exam reveals no gallop and no friction rub.  No murmur heard. Pulmonary/Chest: Effort normal and breath sounds normal. No respiratory distress.  Abdominal: Soft. Bowel sounds are normal. She exhibits no distension. There is tenderness. There is no guarding.    Musculoskeletal: Normal range of motion. She exhibits no edema.  Neurological: She is alert. She exhibits normal muscle tone. Coordination normal.  Skin: Skin is warm and dry. No rash noted.  Psychiatric: She has a  normal mood and affect.  Nursing note and vitals reviewed.    ED Treatments / Results  Labs (all labs ordered are listed, but only abnormal results are displayed) Labs Reviewed  COMPREHENSIVE METABOLIC PANEL - Abnormal; Notable for the following components:      Result Value   Glucose, Bld 105 (*)    Total Protein 8.4 (*)    ALT 83 (*)    All other components within normal limits  URINALYSIS, ROUTINE W REFLEX MICROSCOPIC - Abnormal; Notable for the following components:   Ketones, ur 20 (*)    Protein, ur 30 (*)    Squamous Epithelial / LPF 0-5 (*)    All other components within normal limits  LIPASE, BLOOD  CBC  I-STAT BETA HCG BLOOD, ED ([facility], [facility], [facility] ONLY)    EKG  EKG Interpretation None       Radiology Ct Abdomen Pelvis W Contrast  Result Date: 05/20/2017 CLINICAL DATA:  Mid and right-sided abdominal pain x3 days with vomiting x2 weeks. Suspect gastroenteritis or colitis. EXAM: CT ABDOMEN AND PELVIS WITH CONTRAST TECHNIQUE:  Multidetector CT imaging of the abdomen and pelvis was performed using the standard protocol following bolus administration of intravenous contrast. CONTRAST:  ISOVUE-300 IOPAMIDOL (ISOVUE-300) INJECTION 61% COMPARISON:  02/24/2011. FINDINGS: LOWER CHEST: Lung bases are clear. Included heart size is normal. No pericardial effusion. HEPATOBILIARY: Liver and gallbladder are normal. PANCREAS: Normal. SPLEEN: Normal. ADRENALS/URINARY TRACT: Kidneys are orthotopic, demonstrating symmetric enhancement. No nephrolithiasis, hydronephrosis or solid renal masses. Urinary bladder is partially distended and unremarkable. Normal adrenal glands. STOMACH/BOWEL: The stomach, small and large bowel are normal in course. Mild mural thickening of the distal stomach with mild fluid-filled distention of proximal small intestine raise the possibility of a gastroenteritis. Moderate stool burden is seen within the colon without large bowel inflammation. VASCULAR/LYMPHATIC: Aortoiliac vessels are normal in course and caliber. No lymphadenopathy by CT size criteria. REPRODUCTIVE: Normal.  Tampon artifact noted. OTHER: No intraperitoneal free fluid or free air. MUSCULOSKELETAL: Nonacute. IMPRESSION: Slight mural thickening of the gastric antrum with mild fluid-filled distention of proximal small intestine consistent with a gastroenteritis. Otherwise negative. Electronically Signed   By: Tollie Eth M.D.   On: 05/20/2017 20:28    Procedures Procedures (including critical care time)  Medications Ordered in ED Medications  iopamidol (ISOVUE-300) 61 % injection (not administered)  promethazine (PHENERGAN) injection 12.5 mg (12.5 mg Intravenous Given 05/20/17 1650)  sodium chloride 0.9 % bolus 1,000 mL (0 mLs Intravenous Stopped 05/20/17 1845)  iopamidol (ISOVUE-300) 61 % injection 30 mL (30 mLs Oral Contrast Given 05/20/17 1821)  iopamidol (ISOVUE-300) 61 % injection (100 mLs Intravenous Contrast Given 05/20/17 2015)  ketorolac  (TORADOL) 15 MG/ML injection 15 mg (15 mg Intravenous Given 05/20/17 2028)     Initial Impression / Assessment and Plan / ED Course  I have reviewed the triage vital signs and the nursing notes.  Pertinent labs & imaging results that were available during my care of the patient were reviewed by me and considered in my medical decision making (see chart for details).     Patient presents to ED for evaluation of right lower quadrant and periumbilical abdominal pain as well as emesis for the past 2 weeks.  She states that she has not been feeling well for the past month which her primary care provider attributed to strep throat and a viral URI.  She states that despite the use of her home Zofran she continues to have emesis every time she  tries to eat something.  Physical exam there is tenderness to palpation of the right lower quadrant as well as the periumbilical area.  She is afebrile with no history of fever.  Lab work including CBC, CMP, lipase unremarkable.  HCG is negative.  Urinalysis with no evidence of UTI.  Did show signs of patient's pain controlled here in the ED.  Given IV fluids.  CT of the abdomen and pelvis showed findings consistent with gastroenteritis.  Patient reports much improvement in her symptoms with supportive measures given here.  Will give patient Phenergan to take at home since this worked for her here and advised her to follow-up with her primary care provider for further evaluation.  Patient appears stable for discharge at this time.  Strict return precautions given.  Final Clinical Impressions(s) / ED Diagnoses   Final diagnoses:  Gastroenteritis    ED Discharge Orders        Ordered    promethazine (PHENERGAN) 25 MG tablet  Every 6 hours PRN     05/20/17 2049       Dietrich PatesKhatri, Saory Carriero, PA-C 05/20/17 2054    Alvira MondaySchlossman, Erin, MD 05/21/17 [id number]

## 2017-06-08 ENCOUNTER — Other Ambulatory Visit: Payer: Self-pay

## 2017-06-08 ENCOUNTER — Ambulatory Visit (INDEPENDENT_AMBULATORY_CARE_PROVIDER_SITE_OTHER): Payer: No Typology Code available for payment source | Admitting: Internal Medicine

## 2017-06-08 ENCOUNTER — Encounter: Payer: Self-pay | Admitting: Internal Medicine

## 2017-06-08 VITALS — BP 140/85 | HR 110 | Temp 98.5°F | Ht 64.0 in | Wt 210.6 lb

## 2017-06-08 DIAGNOSIS — R490 Dysphonia: Secondary | ICD-10-CM

## 2017-06-08 MED ORDER — HYDROCODONE-HOMATROPINE 5-1.5 MG/5ML PO SYRP: 5.0000 mL | ORAL_SOLUTION | Freq: Three times a day (TID) | ORAL | 0 refills | Status: DC | PRN

## 2017-06-08 NOTE — Progress Notes (Signed)
[facility] Progress Note  Subjective:  Jessica Knapp is a 26 y.o. female with history of frequent URIs and asthma who presents for recurrent sore throat/hoarseness. Patient was treated for strep throat (rapid strep positive) with penicillin at the beginning of November with little improvement and then received augmentin mid-November. Patient says she was better for about 1 week and then developed sore throat and loss of voice again. She works at a call center. She says she has had several bouts of strep throat over the last 7-8 years. She has a cough that worsens throat pain and makes it difficult to sleep. Hycodan cough syrup prescribed earlier in November helped make her comfortable enough to get some rest. Not taking flonase presently. ROS: No fevers; reports decreased appetite  Social History   Tobacco Use  . Smoking status: Current Some Day Smoker    Packs/day: 0.03    Years: 1.00    Pack years: 0.03    Types: Cigarettes  . Smokeless tobacco: Never Used  Substance Use Topics  . Alcohol use: No    Alcohol/week: 0.0 oz  Patient reports she has been smoking for about 1 year and has "quit" since November when she has been having sore throat.   Objective: Blood pressure 140/85, pulse (!) 110, temperature 98.5 F (36.9 C), temperature source Oral, height 5\' 4"  (1.626 m), weight 210 lb 9.6 oz (95.5 kg), last menstrual period 05/20/2017, SpO2 99 %. Body mass index is 36.15 kg/m. Constitutional: Obese female in NAD, pleasant HENT: Mild erythema of posterior oropharynx, slightly enlarged tonsils without exudate Cardiovascular: RRR, S1, S2, no m/r/g.  Pulmonary/Chest: Effort normal and breath sounds normal.  Skin: Skin is warm and dry. No rash noted.  Psychiatric: Normal mood and affect.  Vitals reviewed  Assessment/Plan: Hoarseness of voice - Acute on chronic. Patient with recurrent bouts of laryngitis and strep throat. Has been hoarse on and off for last month. Likely  2/2 irritation from vocal strain from job and recurrent throat infections. Patient does smoke but < 1 year pack history makes tumor unlikely. - Will place urgent referral to ENT for further evaluation/consideration of tonsillectomy. - Advised against further antibiotics or steroids at this time. If patient develops fever, would obtain throat culture.  - Provided work note for next several days for vocal rest.   Follow-up next week if unable to see ENT quickly.  Jessica GobbleHillary Suprena Travaglini, MD [facility], PGY-3

## 2017-06-08 NOTE — Patient Instructions (Signed)
Ms. Jessica Knapp,  I am referring you the Otolaryngology (Ear, Nose, Throat). Please try to rest your voice as much as possible. Take cough syrup as needed for pain. Please call if you need an extended note for work.  Best, Dr. Sampson GoonFitzgerald   Laryngitis Laryngitis is swelling (inflammation) of your vocal cords. This causes hoarseness, coughing, loss of voice, sore throat, or a dry throat. When your vocal cords are inflamed, your voice sounds different. Laryngitis can be temporary (acute) or long-term (chronic). Most cases of acute laryngitis improve with time. Chronic laryngitis is laryngitis that lasts for more than three weeks. Follow these instructions at home:  Drink enough fluid to keep your pee (urine) clear or pale yellow.  Breathe in moist air. Use a humidifier if you live in a dry climate.  Take medicines only as told by your doctor.  Do not smoke cigarettes or electronic cigarettes. If you need help quitting, ask your doctor.  Talk as little as possible. Also avoid whispering, which can cause vocal strain.  Write instead of talking. Do this until your voice is back to normal. Contact a doctor if:  You have a fever.  Your pain is worse.  You have trouble swallowing. Get help right away if:  You cough up blood.  You have trouble breathing. This information is not intended to replace advice given to you by your health care provider. Make sure you discuss any questions you have with your health care provider. Document Released: 06/03/2011 Document Revised: 11/20/2015 Document Reviewed: 11/27/2013 Elsevier Interactive Patient Education  [employer]2018 Elsevier Inc.

## 2017-06-09 ENCOUNTER — Encounter: Payer: Self-pay | Admitting: Internal Medicine

## 2017-06-09 DIAGNOSIS — R490 Dysphonia: Secondary | ICD-10-CM | POA: Insufficient documentation

## 2017-06-09 NOTE — Assessment & Plan Note (Addendum)
-   Acute on chronic. Patient with recurrent bouts of laryngitis and strep throat. Has been hoarse on and off for last month. Likely 2/2 irritation from vocal strain from job and recurrent throat infections. Patient does smoke but < 1 year pack history makes tumor unlikely. - Will place urgent referral to ENT for further evaluation/consideration of tonsillectomy. - Advised against further antibiotics or steroids at this time. If patient develops fever, would obtain throat culture.  - Provided work note for next several days for vocal rest.

## 2017-06-14 ENCOUNTER — Encounter: Payer: Self-pay | Admitting: Internal Medicine

## 2017-06-14 ENCOUNTER — Telehealth: Payer: Self-pay | Admitting: Internal Medicine

## 2017-06-14 DIAGNOSIS — R490 Dysphonia: Secondary | ICD-10-CM | POA: Insufficient documentation

## 2017-06-14 NOTE — Telephone Encounter (Signed)
Spoke with patient. She will come by get letter and she thanks you.

## 2017-06-14 NOTE — Telephone Encounter (Signed)
Will forward to Dr. Sampson GoonFitzgerald for approval of this note. Jazmin Hartsell,CMA

## 2017-06-14 NOTE — Telephone Encounter (Signed)
Wrote letter and placed in envelope up front. Please let patient know.

## 2017-06-14 NOTE — Telephone Encounter (Signed)
Pt needs another dr note for her hoarseness. Pt says her hoarseness has not changed at all since she has been to the dr and she still hasn't been able to get [facility] yet. Pt would like a dr note for work that is good through 06-20-17. Please advise

## 2017-06-22 ENCOUNTER — Telehealth: Payer: Self-pay | Admitting: *Deleted

## 2017-06-22 NOTE — Telephone Encounter (Signed)
Pt lm on nurse line.  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  She states that she has not had the "procedure yet". Shigeru Lampert, Maryjo RochesterJessica Dawn, CMA

## 2017-06-23 ENCOUNTER — Other Ambulatory Visit: Payer: Self-pay | Admitting: Neurology

## 2017-06-23 ENCOUNTER — Encounter: Payer: Self-pay | Admitting: Internal Medicine

## 2017-06-23 NOTE — Telephone Encounter (Signed)
Placed letter up front for patient.

## 2017-06-26 ENCOUNTER — Other Ambulatory Visit: Payer: Self-pay | Admitting: Neurology

## 2017-06-29 NOTE — Telephone Encounter (Signed)
LMOVM for pt to pick up letter from upfront. Holley Wirt Bruna PotterBlount, CMA

## 2017-07-12 IMAGING — US US TRANSVAGINAL NON-OB
1 series · 15 of 25 positions shown · non-contrast
Comparison: 09/23/2016 pelvic sonogram.

CLINICAL DATA: 25-year-old female with abnormal uterine bleeding.
LMP 09/14/2016. Recently discontinued oral contraception 11/08/2016.

EXAM:
TRANSABDOMINAL AND TRANSVAGINAL ULTRASOUND OF PELVIS
TECHNIQUE: Both transabdominal and transvaginal ultrasound examinations of the
pelvis were performed. Transabdominal technique was performed for
global imaging of the pelvis including uterus, ovaries, adnexal
regions, and pelvic cul-de-sac. It was necessary to proceed with
endovaginal exam following the transabdominal exam to visualize the
endometrium and adnexa.

[Series 1: us transvaginal non-ob · 15 of 80 slices shown]
[im 1/80]
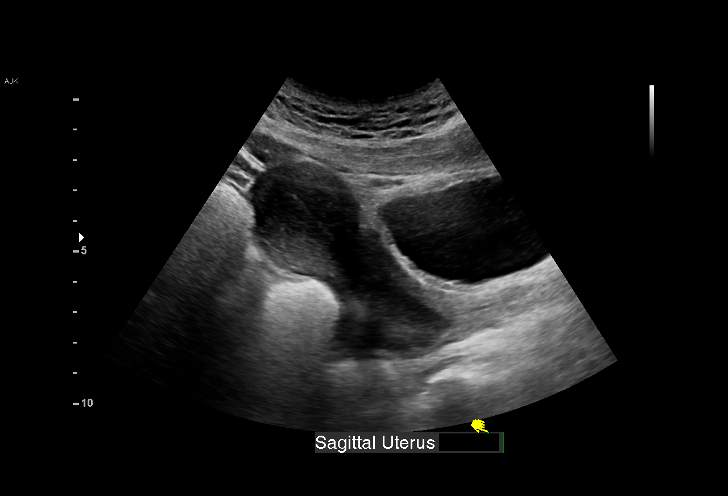
[im 7/80]
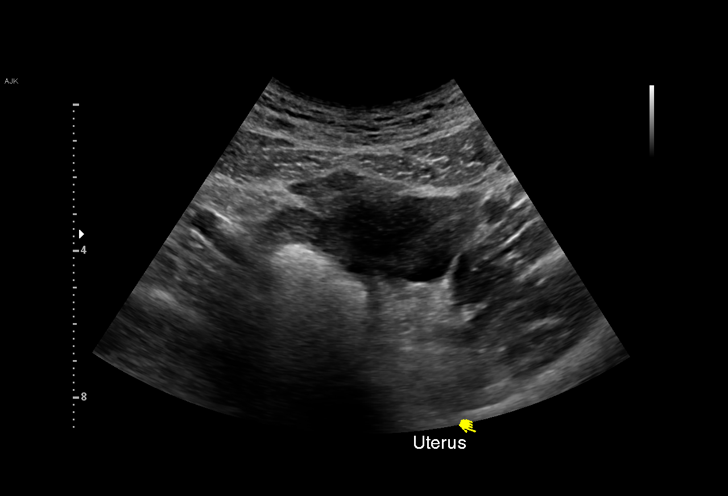
[im 14/80]
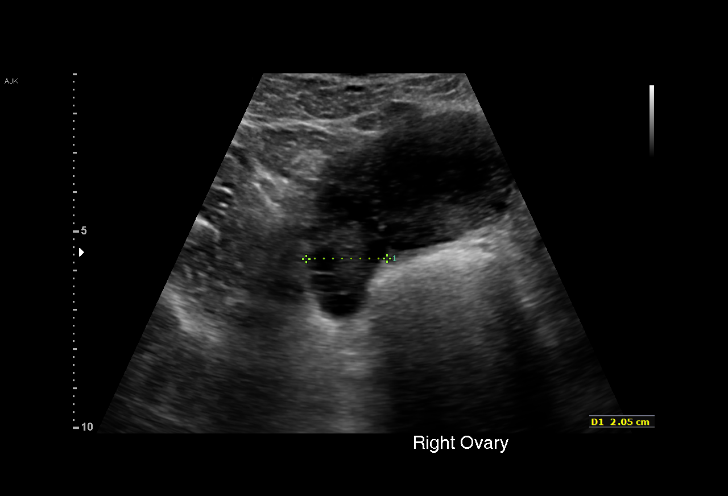
[im 17/80]
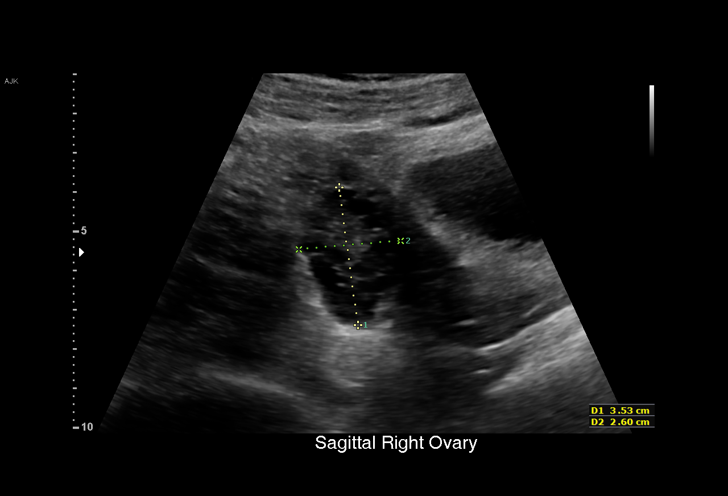
[im 24/80]
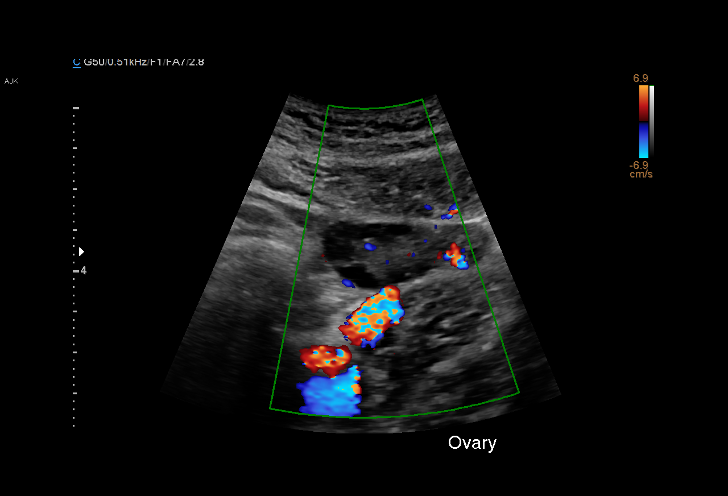
[im 30/80]
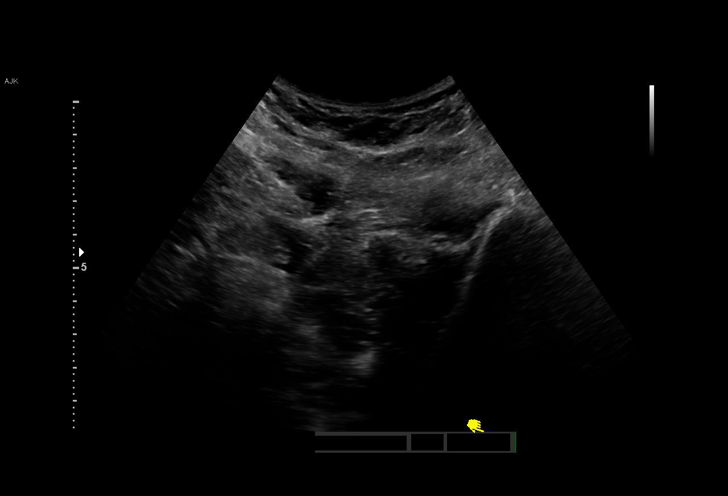
[im 33/80]
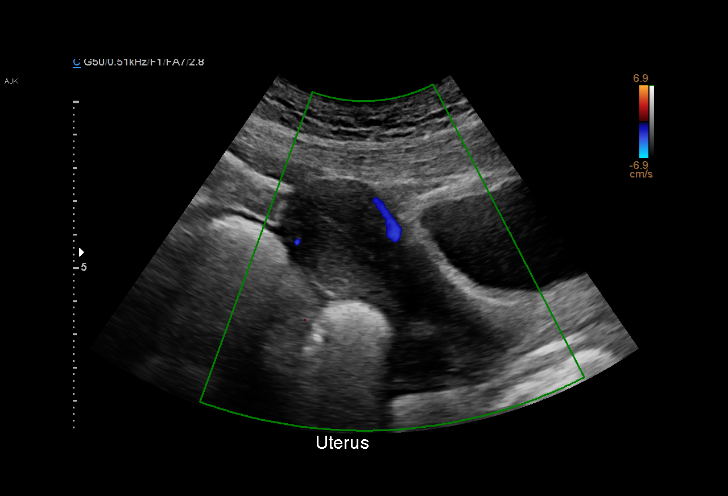
[im 40/80]
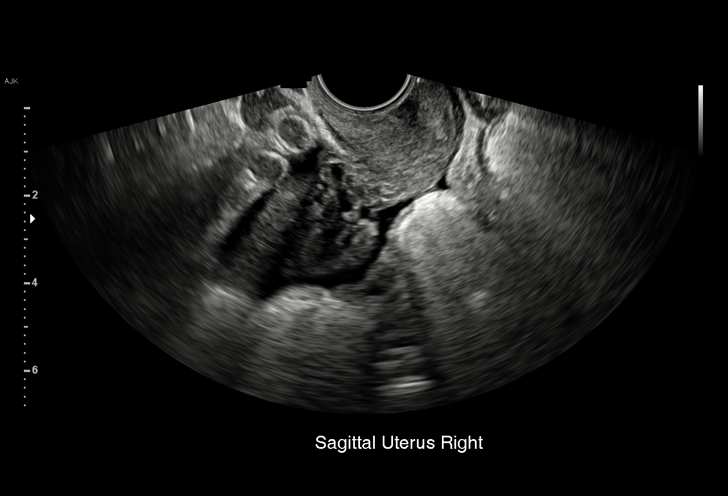
[im 47/80]
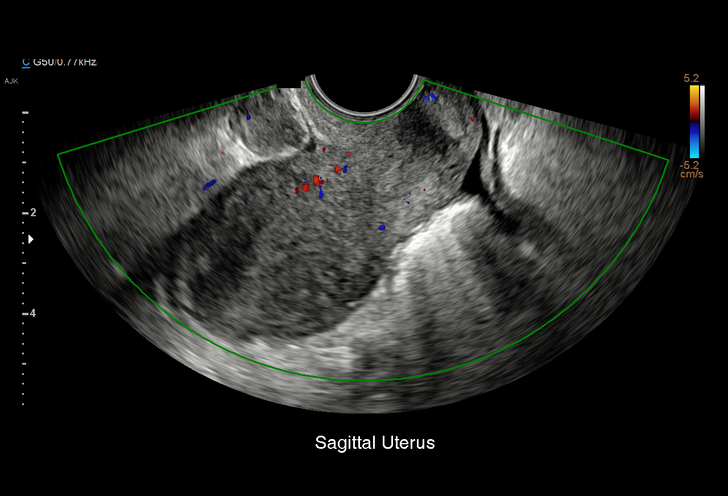
[im 50/80]
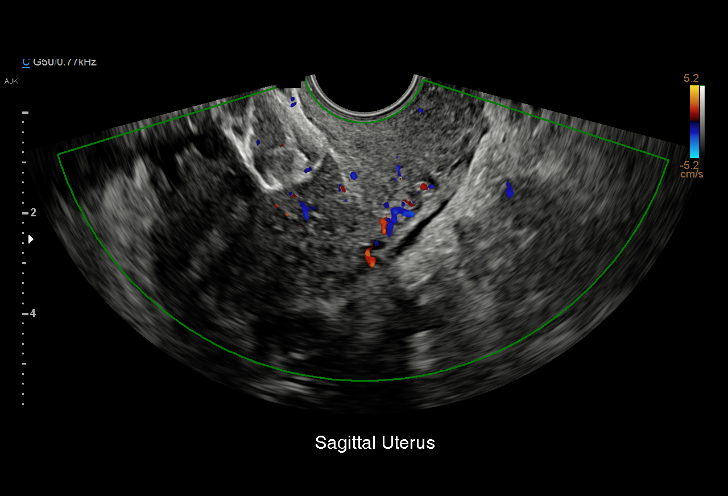
[im 56/80]
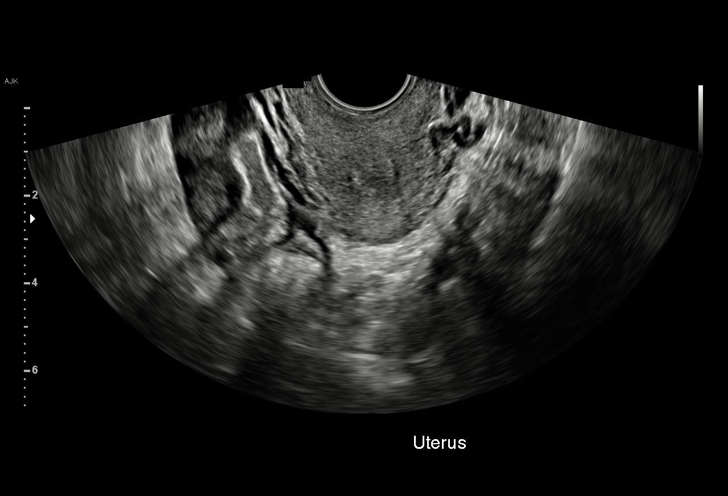
[im 63/80]
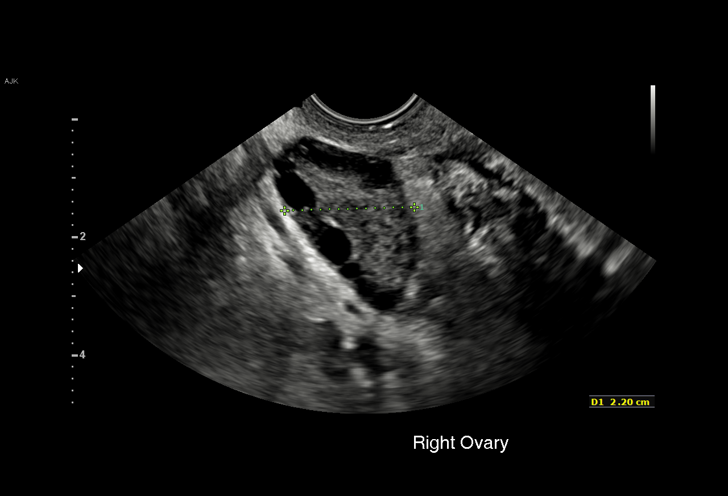
[im 66/80]
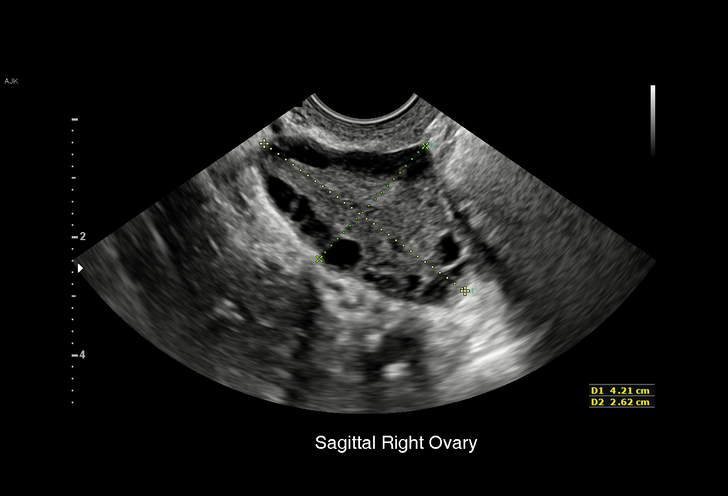
[im 73/80]
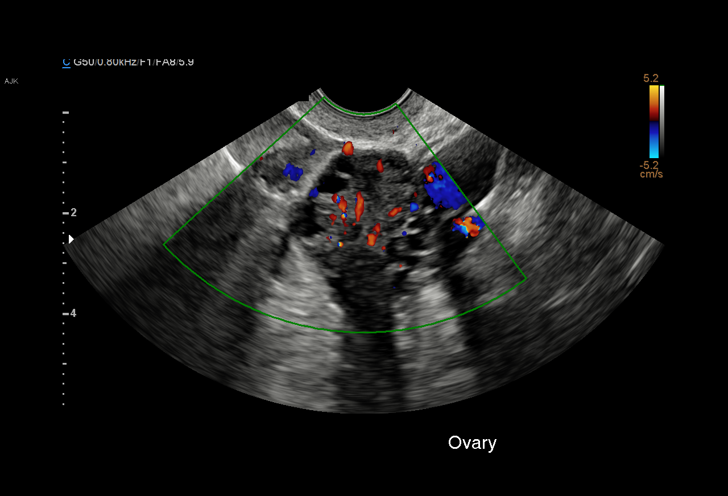
[im 80/80]
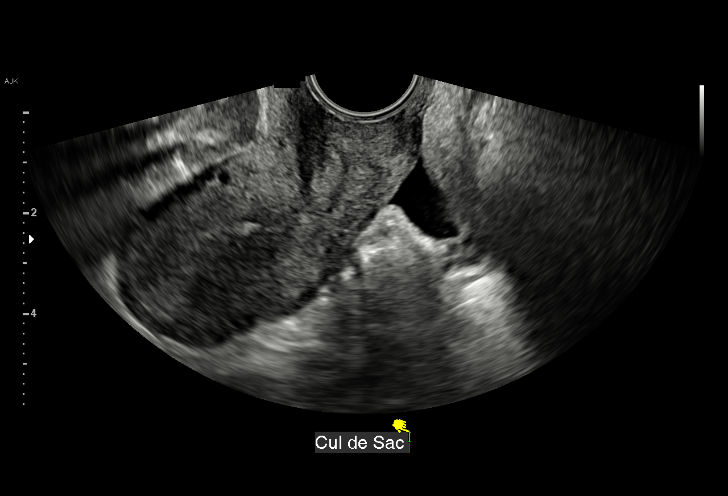

[15 of 25 positions shown; findings below may reference images not displayed]

FINDINGS: Uterus

Measurements: 7.2 x 3.6 x 4.9 cm. Anteverted uterus is normal in
size and configuration, with no uterine fibroids or other myometrial
abnormalities.

Endometrium

Thickness: 3 mm. Minimally heterogeneous endometrium with no focal
endometrial mass.

Right ovary

Measurements: 4.2 x 2.6 x 2.2 cm. Normal appearance/no adnexal mass.

Left ovary

Measurements: 3.5 x 2.0 x 3.0 cm. Normal appearance/no adnexal mass.

Other findings

No abnormal free fluid.
IMPRESSION: 1. Normal anteverted uterus with no uterine fibroids.
2. Bilayer endometrial thickness 3 mm. No focal endometrial mass
detected. If bleeding remains unresponsive to hormonal or medical
therapy, sonohysterogram should be considered for focal lesion
work-up. (Ref: Radiological Reasoning: Algorithmic Workup of
Abnormal Vaginal Bleeding with Endovaginal Sonography and
Sonohysterography. AJR 1551; 191:S68-73).
3. Normal ovaries.  No adnexal masses.

## 2017-07-27 ENCOUNTER — Ambulatory Visit (INDEPENDENT_AMBULATORY_CARE_PROVIDER_SITE_OTHER): Payer: BLUE CROSS/BLUE SHIELD | Admitting: Internal Medicine

## 2017-07-27 ENCOUNTER — Other Ambulatory Visit (HOSPITAL_COMMUNITY)
Admission: RE | Admit: 2017-07-27 | Discharge: 2017-07-27 | Disposition: A | Discharge status: Home / Self Care | Payer: BLUE CROSS/BLUE SHIELD | Source: Ambulatory Visit | Attending: Family Medicine | Admitting: Family Medicine

## 2017-07-27 VITALS — BP 116/80 | HR 86 | Temp 98.3°F | Wt 215.6 lb

## 2017-07-27 DIAGNOSIS — Z124 Encounter for screening for malignant neoplasm of cervix: Secondary | ICD-10-CM

## 2017-07-27 DIAGNOSIS — Z0000 Encounter for general adult medical examination without abnormal findings: Secondary | ICD-10-CM | POA: Diagnosis not present

## 2017-07-27 DIAGNOSIS — Z309 Encounter for contraceptive management, unspecified: Secondary | ICD-10-CM | POA: Diagnosis not present

## 2017-07-27 LAB — POCT WET PREP (WET MOUNT)
Clue Cells Wet Prep Whiff POC: POSITIVE
TRICHOMONAS WET PREP HPF POC: ABSENT

## 2017-07-27 MED ORDER — APRI 0.15-30 MG-MCG PO TABS: 1.0000 | ORAL_TABLET | Freq: Every day | ORAL | 3 refills | Status: DC

## 2017-07-27 NOTE — Progress Notes (Signed)
PATIENT NOT ABLE TO GET TDAP TODAY  BECAUSE WE WERE ALL OUT OF PRIVATE STOCK. PATIENT WAS TOLD TO CHECK BACK IN A WEEK AND SCHEDULE NURSE VISIT IF SHE STILL WANTED IT.Glennie HawkSimpson, Xavian Hardcastle R, CMA

## 2017-07-27 NOTE — Patient Instructions (Signed)
Ms. Samuella Cotarice,  I will call with your pap smear results. If normal, your next screen will be in 3 years.  Try eucerin, cetaphil, or cerave creams for dry skin.  If you have a flare of boils, please make an appointment.  Best, Dr. Sampson GoonFitzgerald

## 2017-07-28 LAB — CERVICOVAGINAL ANCILLARY ONLY
CHLAMYDIA, DNA PROBE: NEGATIVE
Neisseria Gonorrhea: NEGATIVE

## 2017-07-29 ENCOUNTER — Encounter: Payer: Self-pay | Admitting: Internal Medicine

## 2017-07-29 DIAGNOSIS — Z309 Encounter for contraceptive management, unspecified: Secondary | ICD-10-CM | POA: Insufficient documentation

## 2017-07-29 DIAGNOSIS — Z124 Encounter for screening for malignant neoplasm of cervix: Secondary | ICD-10-CM | POA: Insufficient documentation

## 2017-07-29 NOTE — Progress Notes (Signed)
[facility] Progress Note  Subjective:  Gracy BruinsShayla Knapp is a 27 y.o. female with history of recurrent strep throat and laryngitis now s/p tonsillectomy, former tobacco abuse, and migraines who presents for pap smear.  Due for pap: - Last had normal pap smear in 2016 - No family history of cervical cancer - Sexually active with 1 female long-term partner; not concerned for STD but agrees to gc/chlamydia testing - Would like change in birth control; requests switching back to apri birth control from micronor, as periods were lighter and more regular. Does have history of migraines but denies visual or auditory auras. No longer smokes and no history of blood clots.  ROS: No abdominal pain, no vaginal discharge  Other: Mentioned concerns of dry skin and occasional boils of axilla and groin but denies active symptoms.  Allergies  Allergen Reactions  . Apple Hives  . Banana Hives  . Peanut-Containing Drug Products Hives  . Pear Hives  . Plum Pulp Hives    Social History   Tobacco Use  . Smoking status: Current Some Day Smoker    Packs/day: 0.03    Years: 1.00    Pack years: 0.03    Types: Cigarettes  . Smokeless tobacco: Never Used  Substance Use Topics  . Alcohol use: No    Alcohol/week: 0.0 oz    Objective: Blood pressure 116/80, pulse 86, temperature 98.3 F (36.8 C), temperature source Oral, weight 215 lb 9.6 oz (97.8 kg), last menstrual period 07/11/2017, SpO2 100 %. Body mass index is 37.01 kg/m. Constitutional: Obese female in NAD HENT: Small scab on left posterior oropharynx with surgically absent tonsils Abdominal: Soft. +BS, NT GU: Chaperone present. Minimal amount of white discharge and no lesions noted on speculum exam. No cervical motion tenderness on bimanual exam. Psychiatric: Normal mood and affect.  Vitals reviewed  Assessment/Plan: Cervical cancer screening - Performed pap smear. Will contact patient with results.  Encounter for  contraceptive management - Patient counseled on reasons not to be on combined OCP (if develops aura or begins smoking again). She expresses understanding. Ordered requested apri.   Follow-up prn.  Dani GobbleHillary Sueko Dimichele, MD [facility], PGY-3

## 2017-07-29 NOTE — Assessment & Plan Note (Signed)
-   Performed pap smear. Will contact patient with results.

## 2017-07-29 NOTE — Assessment & Plan Note (Signed)
-   Patient counseled on reasons not to be on combined OCP (if develops aura or begins smoking again). She expresses understanding. Ordered requested apri.

## 2017-08-02 ENCOUNTER — Encounter: Payer: Self-pay | Admitting: Internal Medicine

## 2017-08-02 LAB — CYTOLOGY - PAP
DIAGNOSIS: UNDETERMINED — AB
HPV: NOT DETECTED

## 2017-08-22 ENCOUNTER — Ambulatory Visit: Payer: BLUE CROSS/BLUE SHIELD | Attending: Otolaryngology | Admitting: Speech Pathology

## 2017-08-22 ENCOUNTER — Other Ambulatory Visit: Payer: Self-pay

## 2017-08-22 DIAGNOSIS — R498 Other voice and resonance disorders: Secondary | ICD-10-CM | POA: Diagnosis not present

## 2017-08-22 NOTE — Therapy (Signed)
[facility] [address] [place], [place], [address] Phone: [phone]   Fax:  [phone]  Speech Language Pathology Evaluation  Patient Details  Name: Jessica Knapp MRN: [id number] Date of Birth: [birth year] Referring Provider: Dr. Serena Colonel   Encounter Date: 08/22/2017  End of Session - 08/22/17 0800    Visit Number  1    Number of Visits  17    Date for SLP Re-Evaluation  10/21/17    SLP Start Time  0757    SLP Stop Time   0842    SLP Time Calculation (min)  45 min    Activity Tolerance  Patient tolerated treatment well       Past Medical History:  Diagnosis Date  . Asthma   . Chronic headaches   . GERD (gastroesophageal reflux disease)   . Recurrent upper respiratory infection (URI)   . Urticaria     Past Surgical History:  Procedure Laterality Date  . FINGER SURGERY    . HAND SURGERY      There were no vitals filed for this visit.  Subjective Assessment - 08/22/17 0759    Subjective  "I've been battling laryngitis on and off for about 8 years"    Currently in Pain?  Yes    Pain Score  6     Pain Location  Throat    Pain Orientation  Posterior    Pain Descriptors / Indicators  Sore;Aching    Pain Type  Acute pain    Pain Onset  In the past 7 days    Pain Frequency  Constant    Aggravating Factors   coughing    Pain Relieving Factors  nothing    Effect of Pain on Daily Activities  talks less    Multiple Pain Sites  No         SLP Evaluation [facility] - 08/22/17 0759      SLP Visit Information   SLP Received On  08/22/17    Referring Provider  Dr. Serena Colonel    Onset Date  referral 07/28/17 voice problem onset ~8 years ago    Medical Diagnosis  Muscle tension dysphonia      General Information   HPI  Jessica Knapp is a 27 y.o. 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 She has a multiyear history of chronic and intermittent laryngitis and recurrent strep throat. Flexible  fiberoptic laryngoscopy 06/14/18 revealed "The epiglottis, aryepiglottic folds, hypopharynx, supraglottis, glottis were visualized and appeared healthy without mucosal masses or lesions. 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 Pt was evaluated for the same problem 2 years ago at [facility]. Speech therapy was recommended, of which pt attended 2 sessions.     Behavioral/Cognition  alert, cooperative    Mobility Status  ambulated to session      Balance Screen   Has the patient fallen in the past 6 months  No    Has the patient had a decrease in activity level because of a fear of falling?   No    Is the patient reluctant to leave their home because of a fear of falling?   No      Prior Functional Status   Cognitive/Linguistic Baseline  Within functional limits    Type of Home  Apartment     Lives With  Friend(s)      Cognition   Overall Cognitive Status  Within Functional Limits  for tasks assessed      Auditory Comprehension   Overall Auditory Comprehension  Appears within functional limits for tasks assessed      Visual Recognition/Discrimination   Discrimination  Not tested      Reading Comprehension   Reading Status  Not tested      Expression   Primary Mode of Expression  Verbal      Verbal Expression   Overall Verbal Expression  Appears within functional limits for tasks assessed      Written Expression   Dominant Hand  Right    Written Expression  Not tested      Oral Motor/Sensory Function   Overall Oral Motor/Sensory Function  Appears within functional limits for tasks assessed      Motor Speech   Overall Motor Speech  Impaired    Respiration  Impaired    Level of Impairment  Sentence    Phonation  Aphonic;Hoarse;Low vocal intensity    Resonance  Within functional limits    Articulation  Within functional limitis    Intelligibility  Intelligible    Motor Planning  Witnin functional limits    Motor Speech Errors   Not applicable    Phonation  Impaired    Vocal Abuses  Habitual Cough/Throat Clear;Habitual Hyperphonia;Prolonged Vocal Use;Vocal Fold Dehydration    Volume  Soft    Pitch  Low      Standardized Assessments   Standardized Assessments   Other Assessment        Pt describes her voice as "unpredictable." She has decreased her voice use; 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 She reports she uses voice "about 80% of the day," for answerings the phone, meetings, and greeting people who come into the office. She reports frequent throat clearing, and is noted to clear her throat throughout the session today. She reports she drinks between 2 and 4 12 oz. bottles of water, 1-2 cups of coffee per day. She quit smoking approximately 2 months ago because of voice issues, prior to this she smoked one cigar every 2-3 days. She states she has had acid reflux "in the past," but she does report occasional globus sensation, stating "sometimes it feels like foods get stuck in the back of my throat." Today she rates her voice as a 4/10, with 10 being her "normal voice."  Pt's calculated score on VRQOL is 35 (greater than 30 is poor), s/z ratio is 1.02, within normal limits. Modal pitch is 116.5 Hz, low for gender (average for females is 165-255 Hz). Vocal intensity is measured to be average 63 dB at 30cm in conversation (low-mid 70s dB is normal). Vocal quality is hoarse, with intermittent aphonia and laryngeal focus.   Diagnostic treatment was initiated. SLP trained pt in abdominal breathing at rest, providing occasional mod cues to reduce chest movement until pt accuracy was 80% over 2 minute period. SLP added airflow and simple phonation tasks, including automatic speech, which pt performed with occasional min A for AB and notably improved vocal quality, which pt was able to identify. Introduced oral resonance techniques, and while pt was stimulable with nasal consonant sounds, she was  not able to replicate with one syllable words during this brief training period. Education provided re: vocal hygiene, and pt instructed to practice abdominal breathing at least 10 minutes 2x per day.   SLP Education - 08/22/17 1105    Education provided  Yes    Education Details  Higher education careers adviser,  abdominal breathing, course of ST    Person(s) Educated  Patient    Methods  Explanation    Comprehension  Verbalized understanding       SLP Short Term Goals - 08/22/17 1118      SLP SHORT TERM GOAL #1   Title  pt will use abdominal breathing (AB) at rest 85% of the time over 5 minute period x3 visits    Time  4    Period  Weeks    Status  New      SLP SHORT TERM GOAL #2   Title  pt will demo AB in 14/20 sentence responses x2 sessions     Time  4    Period  Weeks    Status  New      SLP SHORT TERM GOAL #3   Title  pt will report carryover of at least 3 vocal hygiene strategies.    Time  4    Period  Weeks    Status  New      SLP SHORT TERM GOAL #4   Title  pt will use balanced oral-nasal resonance with resonant voice therapy at the sentence level with 80% accuracy.    Time  4    Period  Weeks    Status  New       SLP Long Term Goals - 08/22/17 0800      SLP LONG TERM GOAL #1   Title  pt will demo AB 70% of the time in 10 minutes simple conversation x3 visits     Time  8    Period  Weeks    Status  New      SLP LONG TERM GOAL #2   Title  pt will use WNL vocal quality and loudness in 10 minutes simple conversation x3 visits    Time  8    Period  Weeks    Status  New      SLP LONG TERM GOAL #3   Title  pt will report carryover of at least 4 vocal hygiene strategies.     Time  8    Period  Weeks    Status  New      SLP LONG TERM GOAL #4   Title  Pt will report improved voice-related quality of life as measured by VRQOL.    Time  8    Period  Weeks    Status  New       Plan - 08/22/17 0800    Clinical Impression Statement  Patient presents with moderate  dysphonia, characterized by decreased vocal intensity, hoarse quality with intermittent aphonia, shallow, clavicular breathing pattern and pharyngeal focus of resonance. Pitch is judged to be subjectively low; pt reports "sometimes it's higher," for example last week when pt reported, "I had no voice at all." I recommend skilled ST to address dysphonia in order to decrease laryngeal hyperfunction and to improve vocal quality and stamina for communication at work and in daily life.    Speech Therapy Frequency  2x / week    Duration  -- 8 weeks; may decrease frequency/duration if pt progresses quickly    Treatment/Interventions  Cueing hierarchy;Functional tasks;SLP instruction and feedback;Patient/family education;Other (comment) resonant voice therapy, vocal hygiene training, vocal function exercises    Potential to Achieve Goals  Good    Potential Considerations  Other (comment) time post onset    SLP Home Exercise Plan  abdominal breathing and vocal hygiene provided    Consulted and  Agree with Plan of Care  Patient       Patient will benefit from skilled therapeutic intervention in order to improve the following deficits and impairments:   Other voice and resonance disorders    Problem List Patient Active Problem List   Diagnosis Date Noted  . Cervical cancer screening 07/29/2017  . Encounter for contraceptive management 07/29/2017  . Hoarseness of voice 06/09/2017  . Sore throat 05/14/2017  . Streptococcal sore throat 04/29/2017  . Seasonal and perennial allergic rhinitis 01/25/2017  . Allergic conjunctivitis 01/25/2017  . Mild persistent asthma 01/25/2017  . 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 Allergic urticaria 01/25/2017  . Laryngitis 12/13/2016  . URI (upper respiratory infection) 12/06/2016  . Amenorrhea 11/11/2016  . Frequently sick 08/13/2016  . Carpal tunnel syndrome 08/13/2016  . Migraines 08/13/2016  . Cigarette smoker 09/08/2015  . Dyspnea 08/19/2015  . Upper airway  cough syndrome 08/14/2015   Rondel BatonMary Beth Deval Mroczka, MS, CCC-SLP Speech-Language Pathologist  Jessica Knapp 08/22/2017, 11:24 AM  [facility] [address] [place], [place], [address] Phone: [phone]   Fax:  [phone]  Name: Jessica Knapp MRN: [id number] Date of Birth: 10/18/1990

## 2017-08-22 NOTE — Patient Instructions (Signed)
Practice your abdominal breathing 10 minutes, 2x a day.   Abdominal Breathing exercises   . Shoulders down - this is a cue to relax . Place your hand on your abdomen - this helps you focus on easy abdominal breath support - the best and most relaxed way to breathe . Breathe in through your nose and fill your belly with air, watching your hand move outward . Breathe out through your mouth and watch your belly move in. An audible "sh"  may help   Think of your belly as a balloon, when you fill with air (inhale), the balloon gets bigger. As the air goes out (exhale), the balloon deflates.  If you are having difficulty coordinating this, lay on your back with a plastic cup on your belly and repeat the above steps, watching you belly move up with inhalation and down with exhalations  Practice breathing in and out in front of a mirror, watching your belly Breathe in for a count of 5 and breathe out for a count of 5  Sustained "s" or hiss  Now as you breathe out, add a hissing sound Remember to let your belly squeeze in to push out the sound      VOICE CONSERVATION PROGRAM  1. Avoid overuse of voice or excessive use of the voice . The vocal cords can become easily fatigued. Try to sort out what is "necessary" versus "unnecessary" talking in your environment. You do not need to STOP talking, but try to limit it as much as possible.  . Think of resting your voice just as long as you talk. For example, if you talk for 5 minutes, rest your voice completely for 5 minutes. . If your voice feels "tired" during the day, try to rest it as much as possible.  2. Avoid using an excessively loud voice or shouting/raising your voice . When you yell or raise your voice, the vocal cords slam into each other, much like a strong hand clap. This causes irritation, and if this irritation continues, hoarseness may increase. . If people in your home talk loudly, ask them to reduce the volume of their voices to  help you decrease your volume as well. . Sit near or face the person to whom you are speaking.   3. Avoid talking over background noise . When talking in background noise, speech automatically has increased loudness, and as in number 2 above, continued loud speech can result in increased hoarseness due to irritation to the vocal cords.  . Do not talk over the radio or the TV. Mute them before speaking, go to a quieter place to talk, or sit next to the person with whom you are watching.  4. Talk in a voice that is soft, smooth, and gentle . This allows the vocal cords to come together in a gentle way and allows the air being exhaled from the lungs to do most/all of the work when you are speaking.  5. Avoid excessive throat clearing, coughing, and loud laughter . During these activities the vocal cords can also be slammed together in a hard way and foster irritation or swelling, which can cause hoarseness. . Try taking sips of room temperature/cool water with hard swallows to clear secretions from the throat, instead of throat clearing or coughing. . If you absolutely must clear your throat, do so as gently as possible. If you find yourself clearing your throat or coughing a lot, consult your physician. . You will want to laugh. Continue  to do so, but softly and gently. Do not laugh loudly for long periods of time because this can increase the chances for vocal fold irritation and thus hoarseness. . Lozenges can help reduce the need to clear throat/cough during cold/allergy season.  6. Keep the mouth and throat lubricated . Drink at least 8-10 8 oz. glasses of water per day (64-80 oz.). This water can come in the form of drink mixes.  . Caffeinated beverages such as colas, coffee, and tea (hot tea AND sweet tea) dry out the vocal cords and then can cause irritation and thus hoarseness.  . Drinking water will keep your body hydrated. This will help to decrease secretions in the throat, which cause  people to clear their throats or cough. . If you are exercising outside (especially during drier weather), try to breathe through your nose as much as possible. If this is not possible, lifting the tongue up behind the upper teeth when breathing through the mouth will add some moisture to the air breathed in past the vocal cords.  7. Avoid mouth breathing - breathe through your nose . Your nose serves as a natural filter for dust and dirt particles from the air, and as a humidifier to moisten the air. Vocal cords like to work in moistened, filtered air. When you breathe through your mouth you lose the air filtering and moistening benefits of breathing through the nose. Marland Kitchen. Be aware of breathing patterns when sitting quietly (e.g., reading or watching TV). Increase your awareness and try to change habits from mouth breathing to nose breathing during those times.  8. Avoid environmental and/or ingested irritants . Try to avoid smoke-filled and or dusty environments. These items dry out the vocal cords and cause irritation.  9. Use an air filter if the home is dusty, and/or a humidifier if the air is dry  . This will help to maintain clean, humid air to breathe. Remember, this type of air is what the vocal cords like best.

## 2017-08-30 ENCOUNTER — Ambulatory Visit: Payer: BLUE CROSS/BLUE SHIELD | Attending: Otolaryngology | Admitting: Speech Pathology

## 2017-08-30 DIAGNOSIS — R498 Other voice and resonance disorders: Secondary | ICD-10-CM

## 2017-08-30 NOTE — Patient Instructions (Addendum)
  Continue no throat clears - use forceful blow  Continue daily practice of abdominal breathing like Jessica HaleMary Knapp has set   Jessica GrainYawn/sigh 5x after breathing practice or whenever you feel really tight in your throat  Practice using abdominal breathing in speech and conversation  When you hear the frog - take a belly breath  You will feel unnatural doing this breathing at first - that is OK  Use a sticker or some kind of external reminder to belly breathe on your desk at work and at home  Be aware of talking over loud noises - try not to   breathe2relax app

## 2017-08-30 NOTE — Therapy (Signed)
[facility] [address] [place], [place], [address] Phone: [phone]   Fax:  [phone]  Speech Language Pathology Treatment  Patient Details  Name: Jessica Knapp MRN: [id number] Date of Birth: December 26, 1990 Referring Provider: Dr. Serena Colonel   Encounter Date: 08/30/2017  End of Session - 08/30/17 1317    Visit Number  2    Number of Visits  17    Date for SLP Re-Evaluation  10/21/17    SLP Start Time  1233    SLP Stop Time   1312    SLP Time Calculation (min)  39 min       Past Medical History:  Diagnosis Date  . Asthma   . Chronic headaches   . GERD (gastroesophageal reflux disease)   . Recurrent upper respiratory infection (URI)   . Urticaria     Past Surgical History:  Procedure Laterality Date  . FINGER SURGERY    . HAND SURGERY      There were no vitals filed for this visit.  Subjective Assessment - 08/30/17 1243    Subjective  "the no throat clearing has been hard - I take a sip but it doesn't help"    Currently in Pain?  Yes    Pain Score  4     Pain Location  Throat    Pain Orientation  Posterior    Pain Descriptors / Indicators  Sore;Aching            ADULT SLP TREATMENT - 08/30/17 1245      General Information   Behavior/Cognition  Alert;Cooperative;Pleasant mood      Treatment Provided   Treatment provided  Cognitive-Linquistic      Cognitive-Linquistic Treatment   Treatment focused on  Voice    Skilled Treatment  Pt demonstrated abdominal breathing in isolation with mod I. Oral reading with using abdmonimal breath with rare min A. Trained pt in use of abdmonimal breathing  in conversation with occassional min A. Instructed pt to reduce rate of speech - when pt did not use abdominal breathing frequent enough, voice became hoarse which was eliminated by taking a breath. Pt trained to monitor her voice quality and breathe when voice quality declined. Also trained pt in yawn sigh to  reduce tension in pharynx.       Assessment / Recommendations / Plan   Plan  Continue with current plan of care      Progression Toward Goals   Progression toward goals  Progressing toward goals       SLP Education - 08/30/17 1312    Education provided  Yes    Education Details  use external reminder to use abdominal breathing more frequently in conversation, contine no throat clearing    Person(s) Educated  Patient    Methods  Explanation    Comprehension  Verbalized understanding;Returned demonstration;Verbal cues required;Need further instruction       SLP Short Term Goals - 08/30/17 1316      SLP SHORT TERM GOAL #1   Title  pt will use abdominal breathing (AB) at rest 85% of the time over 5 minute period x3 visits    Baseline  08/30/17;    Time  4    Period  Weeks    Status  On-going      SLP SHORT TERM GOAL #2   Title  pt will demo AB in 14/20 sentence responses x2 sessions     Time  4    Period  Weeks    Status  On-going      SLP SHORT TERM GOAL #3   Title  pt will report carryover of at least 3 vocal hygiene strategies.    Time  4    Period  Weeks    Status  On-going      SLP SHORT TERM GOAL #4   Title  pt will use balanced oral-nasal resonance with resonant voice therapy at the sentence level with 80% accuracy.    Time  4    Period  Weeks    Status  On-going       SLP Long Term Goals - 08/30/17 1317      SLP LONG TERM GOAL #1   Title  pt will demo AB 70% of the time in 10 minutes simple conversation x3 visits     Time  8    Period  Weeks    Status  On-going      SLP LONG TERM GOAL #2   Title  pt will use WNL vocal quality and loudness in 10 minutes simple conversation x3 visits    Time  8    Period  Weeks    Status  On-going      SLP LONG TERM GOAL #3   Title  pt will report carryover of at least 4 vocal hygiene strategies.     Time  8    Period  Weeks    Status  On-going      SLP LONG TERM GOAL #4   Title  Pt will report improved  voice-related quality of life as measured by VRQOL.    Time  8    Period  Weeks    Status  On-going       Plan - 08/30/17 1313    Clinical Impression Statement  Pt utilized abdominal breathing in simple conversation with occasional min A to successfully eliminate hoarse voice. She rated her voice as 5/10 today. Volume continues to be low (subjectively) Pt did not clear her throat this sessions and reports she is somewhat successful reducing throat clearing. Continue skilled ST to improve voice quality and staminal for communication at work and daily life.     Speech Therapy Frequency  2x / week    Treatment/Interventions  Cueing hierarchy;Functional tasks;SLP instruction and feedback;Patient/family education;Other (comment)    Potential to Achieve Goals  Good    SLP Home Exercise Plan  abdominal breathing and vocal hygiene provided    Consulted and Agree with Plan of Care  Patient       Patient will benefit from skilled therapeutic intervention in order to improve the following deficits and impairments:   Other voice and resonance disorders    Problem List Patient Active Problem List   Diagnosis Date Noted  . Cervical cancer screening 07/29/2017  . Encounter for contraceptive management 07/29/2017  . Hoarseness of voice 06/09/2017  . Sore throat 05/14/2017  . Streptococcal sore throat 04/29/2017  . Seasonal and perennial allergic rhinitis 01/25/2017  . Allergic conjunctivitis 01/25/2017  . Mild persistent asthma 01/25/2017  . 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 Allergic urticaria 01/25/2017  . Laryngitis 12/13/2016  . URI (upper respiratory infection) 12/06/2016  . Amenorrhea 11/11/2016  . Frequently sick 08/13/2016  . Carpal tunnel syndrome 08/13/2016  . Migraines 08/13/2016  . Cigarette smoker 09/08/2015  . Dyspnea 08/19/2015  . Upper airway cough syndrome 08/14/2015    Kameria Canizares, Radene JourneyLaura Ann MS, CCC-SLP 08/30/2017, 1:20 PM  [facility] [address] [place], [place], [address] Phone: [phone]   Fax:  [phone]   Name: Adline Kirshenbaum MRN: [id number] Date of Birth: Oct 24, 1990

## 2017-09-05 ENCOUNTER — Telehealth: Payer: Self-pay | Admitting: Family Medicine

## 2017-09-05 ENCOUNTER — Ambulatory Visit: Payer: BLUE CROSS/BLUE SHIELD | Admitting: Speech Pathology

## 2017-09-05 DIAGNOSIS — R498 Other voice and resonance disorders: Secondary | ICD-10-CM | POA: Diagnosis not present

## 2017-09-05 NOTE — Patient Instructions (Signed)
Continue no throat clears - use forceful blow  Continue daily practice of abdominal breathing   Yawn/sigh 5x after breathing practice or whenever you feel really tight in your throat  Practice using abdominal breathing in speech and conversation  Try reading out loud with a focus on keeping your voice clear and using abdominal breathing; 20-30 min per day  When you hear the frog - take a belly breath

## 2017-09-05 NOTE — Telephone Encounter (Signed)
**  After Hours/ Emergency Line Call*  Received a call to report that Jessica Knapp is on her menstrual cycle and something came out that was "flesh like" and this has her concerned. Had a period last week, and came back as of last night. Normally has regular periods. On OCP last missed dose was about a month ago. Does endorse sexual activity. Endorses bad cramps. Worse than normal.  Asked patient what she wanted to do in terms of workup. She states she would rather not be seen in the main ED secondary to wait times. Offered MAU visit for gyn, patient states she would rather wait until tomorrow morning. Endorsing cramping.  Denying severe pain, changes in mentation. Recommended that patient be seen tomorrow in same day appt and go to MAU or ED if worsening pain or worsening bleeding. Booked 1110am appt with Dr. Ottie GlazierGunadasa at patients request.  Red flags discussed.  Will forward to PCP and Dr. Ottie GlazierGunadasa.   Of note, based on passing "flesh like" products and missed OCP 1 month ago, I would be concerned for early complete or partial AB. Did not counsel patient on this suspicion, would consider beta Hcg in clinic tomorrow after speculum exam.   Loni MuseKate Timberlake, MD PGY-2, [facility]

## 2017-09-05 NOTE — Therapy (Signed)
[facility] [address] [place], [place], [address] Phone: [phone]   Fax:  [phone]  Speech Language Pathology Treatment  Patient Details  Name: Jessica Knapp MRN: [id number] Date of Birth: 1990/09/06 Referring Provider: Dr. Serena Colonel   Encounter Date: 09/05/2017  End of Session - 09/05/17 0838    Visit Number  3    Number of Visits  17    Date for SLP Re-Evaluation  10/21/17    SLP Start Time  0807 pt arrived 7 min late    SLP Stop Time   0845    SLP Time Calculation (min)  38 min    Activity Tolerance  Patient tolerated treatment well       Past Medical History:  Diagnosis Date  . Asthma   . Chronic headaches   . GERD (gastroesophageal reflux disease)   . Recurrent upper respiratory infection (URI)   . Urticaria     Past Surgical History:  Procedure Laterality Date  . FINGER SURGERY    . HAND SURGERY      There were no vitals filed for this visit.  Subjective Assessment - 09/05/17 0808    Subjective  "It's slowly going away." re: voice has been fading    Currently in Pain?  Yes    Pain Score  8     Pain Location  Throat    Pain Onset  In the past 7 days    Aggravating Factors   pt has a cold    Pain Relieving Factors  nothing            ADULT SLP TREATMENT - 09/05/17 0807      General Information   Behavior/Cognition  Alert;Cooperative    Patient Positioning  Upright in chair      Treatment Provided   Treatment provided  Cognitive-Linquistic      Cognitive-Linquistic Treatment   Treatment focused on  Voice    Skilled Treatment  Pt reports breathing, speech was going well at home until she caught a cold 2 days ago. Pt was independent with abdominal breathing in isolation, mod I in automatic speech and simple word level tasks. Throat clearing x3. Pt self-monitored vocal quality in sentence level task with modified independence, 90% accuracy. SLP progressed to simple conversation,  pt self-monitored vocal quality and used AB with 85% accuracy in 10 minutes conversation x2. Demo'd awareness of decreased vocal quality but did not always self-correct (rare min A req'd).       Assessment / Recommendations / Plan   Plan  Continue with current plan of care will decrease frequency to 1x per week      Progression Toward Goals   Progression toward goals  Progressing toward goals         SLP Short Term Goals - 09/05/17 0834      SLP SHORT TERM GOAL #1   Title  pt will use abdominal breathing (AB) at rest 85% of the time over 5 minute period x3 visits    Baseline  08/30/17; 09/05/17    Time  3    Period  Weeks    Status  On-going      SLP SHORT TERM GOAL #2   Title  pt will demo AB in 14/20 sentence responses x2 sessions     Baseline  09/05/17    Time  3    Period  Weeks    Status  On-going      SLP SHORT TERM  GOAL #3   Title  pt will report carryover of at least 3 vocal hygiene strategies.    Baseline  pt reports 2 (3/11)    Time  3    Period  Weeks    Status  On-going      SLP SHORT TERM GOAL #4   Title  pt will use balanced oral-nasal resonance with resonant voice therapy at the sentence level with 80% accuracy.    Time  3    Period  Weeks    Status  Deferred       SLP Long Term Goals - 09/05/17 4098      SLP LONG TERM GOAL #1   Title  pt will demo AB 70% of the time in 10 minutes simple conversation x3 visits     Baseline  09/05/17    Time  7    Period  Weeks    Status  On-going      SLP LONG TERM GOAL #2   Title  pt will use WNL vocal quality and loudness in 10 minutes simple conversation x3 visits    Baseline  09/05/17    Time  7    Period  Weeks    Status  On-going      SLP LONG TERM GOAL #3   Title  pt will report carryover of at least 4 vocal hygiene strategies.     Time  7    Period  Weeks    Status  On-going      SLP LONG TERM GOAL #4   Title  Pt will report improved voice-related quality of life as measured by VRQOL.    Time  7     Period  Weeks    Status  On-going       Plan - 09/05/17 0847    Clinical Impression Statement  Pt utilized abdominal breathing in simple conversation with 85% accuracy; rare min A required to successfully eliminate hoarse voice. Volume continues to be low (subjectively) Pt did clear her throat x3 this session; she reports she was very successful reducing throat clearing until catching a cold 2 days ago. Pt progressing more quickly than expected; will decrease frequency to 1x per week. Continue skilled ST to improve voice quality and staminal for communication at work and daily life.    Speech Therapy Frequency  1x /week    Treatment/Interventions  Cueing hierarchy;Functional tasks;SLP instruction and feedback;Patient/family education;Other (comment)    Potential to Achieve Goals  Good    SLP Home Exercise Plan  abdominal breathing and vocal hygiene provided    Consulted and Agree with Plan of Care  Patient       Patient will benefit from skilled therapeutic intervention in order to improve the following deficits and impairments:   Other voice and resonance disorders    Problem List Patient Active Problem List   Diagnosis Date Noted  . Cervical cancer screening 07/29/2017  . Encounter for contraceptive management 07/29/2017  . Hoarseness of voice 06/09/2017  . Sore throat 05/14/2017  . Streptococcal sore throat 04/29/2017  . Seasonal and perennial allergic rhinitis 01/25/2017  . Allergic conjunctivitis 01/25/2017  . Mild persistent asthma 01/25/2017  . 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 Allergic urticaria 01/25/2017  . Laryngitis 12/13/2016  . URI (upper respiratory infection) 12/06/2016  . Amenorrhea 11/11/2016  . Frequently sick 08/13/2016  . Carpal tunnel syndrome 08/13/2016  . Migraines 08/13/2016  . Cigarette smoker 09/08/2015  . Dyspnea 08/19/2015  . Upper airway  cough syndrome 08/14/2015   Rondel BatonMary Beth Lenette Rau, MS, CCC-SLP Speech-Language Pathologist  Arlana LindauMary E  Eman Morimoto 09/05/2017, 8:49 AM  [facility] [address] [place], [place], [address] Phone: [phone]   Fax:  [phone]   Name: Jessica Knapp MRN: [id number] Date of Birth: 05/06/1991

## 2017-09-06 ENCOUNTER — Other Ambulatory Visit: Payer: Self-pay

## 2017-09-06 ENCOUNTER — Telehealth: Payer: Self-pay | Admitting: Internal Medicine

## 2017-09-06 ENCOUNTER — Encounter: Payer: Self-pay | Admitting: Internal Medicine

## 2017-09-06 ENCOUNTER — Other Ambulatory Visit (HOSPITAL_COMMUNITY)
Admission: RE | Admit: 2017-09-06 | Discharge: 2017-09-06 | Disposition: A | Discharge status: Home / Self Care | Payer: BLUE CROSS/BLUE SHIELD | Source: Ambulatory Visit | Attending: Family Medicine | Admitting: Family Medicine

## 2017-09-06 ENCOUNTER — Ambulatory Visit (INDEPENDENT_AMBULATORY_CARE_PROVIDER_SITE_OTHER): Payer: BLUE CROSS/BLUE SHIELD | Admitting: Internal Medicine

## 2017-09-06 VITALS — BP 124/68 | HR 111 | Temp 98.6°F | Wt 217.0 lb

## 2017-09-06 DIAGNOSIS — Z3202 Encounter for pregnancy test, result negative: Secondary | ICD-10-CM

## 2017-09-06 DIAGNOSIS — N939 Abnormal uterine and vaginal bleeding, unspecified: Secondary | ICD-10-CM | POA: Insufficient documentation

## 2017-09-06 DIAGNOSIS — N926 Irregular menstruation, unspecified: Secondary | ICD-10-CM

## 2017-09-06 LAB — POCT WET PREP (WET MOUNT)
Clue Cells Wet Prep Whiff POC: NEGATIVE
TRICHOMONAS WET PREP HPF POC: ABSENT

## 2017-09-06 LAB — POCT URINE PREGNANCY: PREG TEST UR: NEGATIVE

## 2017-09-06 LAB — BETA HCG QUANT (REF LAB): hCG Quant: <1 m[IU]/mL

## 2017-09-06 MED ORDER — NAPROXEN 500 MG PO TABS: 500.0000 mg | ORAL_TABLET | Freq: Two times a day (BID) | ORAL | 0 refills | Status: DC

## 2017-09-06 NOTE — Telephone Encounter (Signed)
Lab reported serum beta hcg is <1. Called patient to inform. She is to continue taking OCP and we will also do short course of Naproxen 500mg  BID to help with cramping and bleeding. Gc/Chlamydia sent today.

## 2017-09-06 NOTE — Progress Notes (Signed)
   [facility] Phone: [phone]   Date of Visit: 09/06/2017   HPI:  Irregular Bleeding: -Patient reports that she had what she thought was a normal period last week.  Then 2 days ago she started bleeding again.  Yesterday when she was removing her tampon she noticed some tissue-like material that passed.  She has never had this before.  Yesterday she started to have some heavier bleeding.  She is not passing clots.  She has some cramping that are similar to her period cramps.  She is sexually active.  She has had 3 partners in the past year.  She is on oral contraceptives.  She reports that about 1 month ago she did miss one dose of it.  She does have a history of a spontaneous miscarriage about 1 year ago which did not require any medication or procedures.  ROS: See HPI.  PMFSH: PMH: allergic rhinitis, migraines  PHYSICAL EXAM: BP 124/68   Pulse (!) 111   Temp 98.6 F (37 C) (Oral)   Wt 217 lb (98.4 kg)   LMP 08/29/2017 (Exact Date)   SpO2 98%   BMI 37.25 kg/m  GEN: NAD CV: RRR, no murmurs, rubs, or gallops PULM: CTAB, normal effort ABD: Soft, mild tenderness to palpation of the suprapubic region and the left and right lower abdomen without guarding or rebound, nondistended, NABS, no organomegaly GU: Female genitalia: normal external genitalia, vulva, vagina, cervix, uterus and adnexa. There is blood in the vaginal canal but no abnormal tissue or clots. No cervical motion tenderness.  SKIN: No rash or cyanosis; warm and well-perfused EXTR: No lower extremity edema or calf tenderness PSYCH: Mood and affect euthymic, normal rate and volume of speech NEURO: Awake, alert, no focal deficits grossly, normal speech  ASSESSMENT/PLAN:  Abnormal Vaginal Bleeding:  Vitals are stable and patient is not in distress and is non-toxic appearing. No signs of PID. Urine pregnancy test is negative, but will obtain STAT serum bhcg quant to confirm. Further management will  depend on blood work. Also obtained Gc/Chlamydia testing. Wet prep is unremarkable.   Palma HolterKanishka G Jayleen Afonso, MD PGY 3 [facility]

## 2017-09-06 NOTE — Patient Instructions (Signed)
Your urine test is negative.  We will get a blood test to evaluate if you are pregnant. Further management will depend on this. I will call you with the results today.

## 2017-09-07 LAB — CERVICOVAGINAL ANCILLARY ONLY
Chlamydia: NEGATIVE
Neisseria Gonorrhea: NEGATIVE

## 2017-09-08 ENCOUNTER — Encounter: Payer: Self-pay | Admitting: Speech Pathology

## 2017-09-08 ENCOUNTER — Telehealth: Payer: Self-pay | Admitting: Internal Medicine

## 2017-09-08 NOTE — Telephone Encounter (Signed)
Attempted to call patient. Phone continued to ring without going to voicemail. Will try later.

## 2017-09-12 ENCOUNTER — Ambulatory Visit: Payer: BLUE CROSS/BLUE SHIELD | Admitting: Speech Pathology

## 2017-09-12 ENCOUNTER — Telehealth: Payer: Self-pay | Admitting: Internal Medicine

## 2017-09-12 NOTE — Telephone Encounter (Signed)
Pt called back for results. Aware STI screening negative. Shawna OrleansMeredith B Madelin Weseman, RN

## 2017-09-12 NOTE — Telephone Encounter (Signed)
Attempted to call patient again. Went to [employer]voicemail. Will send letter reporting of negative STI.

## 2017-09-16 ENCOUNTER — Encounter: Payer: Self-pay | Admitting: Speech Pathology

## 2017-09-19 ENCOUNTER — Encounter: Payer: Self-pay | Admitting: Speech Pathology

## 2017-09-21 ENCOUNTER — Encounter: Payer: Self-pay | Admitting: Internal Medicine

## 2017-09-21 ENCOUNTER — Other Ambulatory Visit: Payer: Self-pay

## 2017-09-21 ENCOUNTER — Ambulatory Visit: Payer: BLUE CROSS/BLUE SHIELD | Admitting: Internal Medicine

## 2017-09-21 DIAGNOSIS — N926 Irregular menstruation, unspecified: Secondary | ICD-10-CM | POA: Diagnosis not present

## 2017-09-21 NOTE — Progress Notes (Signed)
Subjective:   Patient: Jessica Knapp       Birthdate: 12/02/1990       MRN: [id number]      HPI  Jessica Knapp is a 27 y.o. female presenting for abnormal menstrual bleeding.   Abnormal menstrual bleeding Was seen on 03/12 for this issue. At that appt, upreg was neg and serum beta hCG was also obtained to rule out pregnancy, which was also negative. GC/chlamydia and wet prep were negative as well. Patient continued to take the OCPs that she was already on, and as also instructed to take a course of naproxen to help with menstrual cramping.  Since then, patient reports bleeding resolved, but yesterday she had one day of heavy bleeding with clots. Today bleeding have significantly lessened but she is still having some spotting. Says that she continues to have cramping with periods. Naproxen helped, however she ran out of prescription and did not buy any over the counter. Endorses fatigue but no other new symptoms. Denies vaginal discharge.   Of note, patient very agitated throughout encounter. States that she knows something is wrong with her and no one cares to figure it out. Says that she knows tissue came out of her and something is wrong with her body. Says that her aunt is in the medical field and agrees that something is wrong and told her to come here today. When asked what patient was hoping to get out of today's visit or if there was any specific testing that she desired, she says she simply wanted to be told what is wrong and that there is no additional testing that she was hoping for. Declines repeat pelvic exam today.    Smoking status reviewed. Patient is former smoker.   Review of Systems See HPI.     Objective:  Physical Exam  Constitutional: She is oriented to person, place, and time and well-developed, well-nourished, and in no distress.  HENT:  Head: Normocephalic and atraumatic.  Pulmonary/Chest: Effort normal. No respiratory distress.  Neurological: She is alert and  oriented to person, place, and time.  Psychiatric:  Flat affect. Agitated and raising voice at times.       Assessment & Plan:  Irregular menstrual bleeding This month only. No history of irregular bleeding prior to this month. At last visit two weeks ago, thorough work-up performed by Dr. Ottie GlazierGunadasa with no abnormalities. Discussed with patient that, as prior work-up was normal and as symptoms have only been going on for three weeks, it is most appropriate to continue to monitor her menstrual bleeding at this time, as there is a high likelihood that her bleeding will normalize over the next month or so. Discussed that hormones change month to month for various reasons which can affect menstrual bleeding. Patient very agitated when discussing monitoring her bleeding rather than continuing work-up or beginning medications today. Acknowledged patient's frustration, asking what she was hoping to get out of today's visit, including if there were any specific tests she was hoping to have performed. Offered to perform repeat pelvic exam with testing if that would give her peace of mind, however patient declines this. At that point, patient stated, "This visit is over." I subsequently printed her AVS with instructions encouraging her to call if menstrual irregularities continue. Could consider adjusting birth control at that time (patient is apparently taking OCPs continuously and skipping placebo week, however was not able to clarify this before she decided to leave).    Tarri AbernethyAbigail J Levi Klaiber, MD, MPH PGY-3  [facility] Pager [phone]

## 2017-09-21 NOTE — Patient Instructions (Addendum)
It was nice meeting you today Jessica Knapp!  If you continue to have irregular menstrual bleeding after this month, please let us know, and we can discuss making changes to your birth control. Please continue taking your birth control pills in the meantime as you have been.   If you have any questions or concerns, please feel free to call the clinic.   Be well,  Dr. Natale MilchLancaster

## 2017-09-21 NOTE — Assessment & Plan Note (Signed)
This month only. No history of irregular bleeding prior to this month. At last visit two weeks ago, thorough work-up performed by Dr. Ottie GlazierGunadasa with no abnormalities. Discussed with patient that, as prior work-up was normal and as symptoms have only been going on for three weeks, it is most appropriate to continue to monitor her menstrual bleeding at this time, as there is a high likelihood that her bleeding will normalize over the next month or so. Discussed that hormones change month to month for various reasons which can affect menstrual bleeding. Patient very agitated when discussing monitoring her bleeding rather than continuing work-up or beginning medications today. Acknowledged patient's frustration, asking what she was hoping to get out of today's visit, including if there were any specific tests she was hoping to have performed. Offered to perform repeat pelvic exam with testing if that would give her peace of mind, however patient declines this. At that point, patient stated, "This visit is over." I subsequently printed her AVS with instructions encouraging her to call if menstrual irregularities continue. Could consider adjusting birth control at that time (patient is apparently taking OCPs continuously and skipping placebo week, however was not able to clarify this before she decided to leave).

## 2017-09-22 ENCOUNTER — Ambulatory Visit: Payer: BLUE CROSS/BLUE SHIELD | Admitting: Speech Pathology

## 2017-09-22 DIAGNOSIS — R498 Other voice and resonance disorders: Secondary | ICD-10-CM

## 2017-09-22 NOTE — Therapy (Signed)
[facility] [address] [place], [place], [address] Phone: [phone]   Fax:  [phone]  Speech Language Pathology Treatment  Patient Details  Name: Jessica BruinsShayla Knapp MRN: [id number] Date of Birth: 05/07/1991 Referring Provider: Dr. Serena ColonelJefry Rosen   Encounter Date: 09/22/2017  End of Session - 09/22/17 1852    Visit Number  4    Number of Visits  17    Date for SLP Re-Evaluation  10/21/17    SLP Start Time  1626 pt 11 min late    SLP Stop Time   1700    SLP Time Calculation (min)  34 min    Activity Tolerance  Patient tolerated treatment well       Past Medical History:  Diagnosis Date  . Asthma   . Chronic headaches   . GERD (gastroesophageal reflux disease)   . Recurrent upper respiratory infection (URI)   . Urticaria     Past Surgical History:  Procedure Laterality Date  . FINGER SURGERY    . HAND SURGERY      There were no vitals filed for this visit.  Subjective Assessment - 09/22/17 1626    Subjective  "I focus more on my breathing when i come in here"    Currently in Pain?  Yes    Pain Score  6     Pain Location  Throat    Pain Orientation  Posterior;Mid    Pain Descriptors / Indicators  Aching;Sore    Pain Type  Acute pain    Pain Onset  In the past 7 days    Aggravating Factors   allergies    Pain Relieving Factors  allergy meds    Multiple Pain Sites  No            ADULT SLP TREATMENT - 09/22/17 1628      General Information   Behavior/Cognition  Alert;Cooperative    Patient Positioning  Upright in chair      Treatment Provided   Treatment provided  Cognitive-Linquistic      Pain Assessment   Pain Assessment  No/denies pain      Cognitive-Linquistic Treatment   Treatment focused on  Voice    Skilled Treatment  Pt states she has been busy and hasn't paid much attention to her voice quality, though she does report using vocal hygiene strategies, "I barely clear my throat  anymore." She is drinking more water and her dad has noticed her breathing more frequently on the phone. She is independent with abdominal breathing in isolation today. Pt self-monitored vocal quality and AB in sentence level tasks, 95% accuracy, and in short sentence spontaneous responses 90% accuracy. In 15 minute conversation, pt self-monitored vocal quality, demo'ing awareness of 2/4 instances of sub-WNL quality due to speaking on residual capacity and adjusted by taking another breath.      Assessment / Recommendations / Plan   Plan  Continue with current plan of care      Progression Toward Goals   Progression toward goals  Progressing toward goals         SLP Short Term Goals - 09/22/17 1631      SLP SHORT TERM GOAL #1   Title  pt will use abdominal breathing (AB) at rest 85% of the time over 5 minute period x3 visits    Baseline  08/30/17; 09/05/17    Time  2    Period  Weeks    Status  Achieved  SLP SHORT TERM GOAL #2   Title  pt will demo AB in 14/20 sentence responses x2 sessions     Baseline  09/05/17, 09/22/17    Time  2    Period  Weeks    Status  On-going      SLP SHORT TERM GOAL #3   Title  pt will report carryover of at least 3 vocal hygiene strategies.    Baseline  pt reports 2 (3/11)    Time  2    Period  Weeks    Status  On-going      SLP SHORT TERM GOAL #4   Title  pt will use balanced oral-nasal resonance with resonant voice therapy at the sentence level with 80% accuracy.    Period  Weeks    Status  Deferred       SLP Long Term Goals - 09/22/17 1654      SLP LONG TERM GOAL #1   Title  pt will demo AB 70% of the time in 10 minutes simple conversation x3 visits     Baseline  09/05/17, 09/22/17    Time  6    Period  Weeks    Status  On-going      SLP LONG TERM GOAL #2   Title  pt will use WNL vocal quality and loudness in 10 minutes simple conversation x3 visits    Baseline  09/05/17 09/22/17    Time  6    Period  Weeks    Status  On-going       SLP LONG TERM GOAL #3   Title  pt will report carryover of at least 4 vocal hygiene strategies.     Time  6    Period  Weeks    Status  On-going      SLP LONG TERM GOAL #4   Title  Pt will report improved voice-related quality of life as measured by VRQOL.    Time  6    Period  Weeks    Status  On-going       Plan - 09/22/17 1854    Clinical Impression Statement  Pt utilized abdominal breathing in simple conversation with 85% accuracy; self-monitoring for vocal quality and ID'd 2/4 instances of speaking on residual capacity. Volume subjectively WNL today. No throat clearing noted today. Pt continues to make good progress. Continue skilled ST to improve voice quality and staminal for communication at work and daily life.    Speech Therapy Frequency  1x /week    Treatment/Interventions  Cueing hierarchy;Functional tasks;SLP instruction and feedback;Patient/family education;Other (comment)    Potential to Achieve Goals  Good    SLP Home Exercise Plan  abdominal breathing and vocal hygiene provided    Consulted and Agree with Plan of Care  Patient       Patient will benefit from skilled therapeutic intervention in order to improve the following deficits and impairments:   Other voice and resonance disorders    Problem List Patient Active Problem List   Diagnosis Date Noted  . Irregular menstrual bleeding 09/21/2017  . Cervical cancer screening 07/29/2017  . Encounter for contraceptive management 07/29/2017  . Hoarseness of voice 06/09/2017  . Sore throat 05/14/2017  . Streptococcal sore throat 04/29/2017  . Seasonal and perennial allergic rhinitis 01/25/2017  . Allergic conjunctivitis 01/25/2017  . Mild persistent asthma 01/25/2017  . 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 Allergic urticaria 01/25/2017  . Laryngitis 12/13/2016  . URI (upper respiratory infection) 12/06/2016  .  Amenorrhea 11/11/2016  . Frequently sick 08/13/2016  . Carpal tunnel syndrome 08/13/2016  .  Migraines 08/13/2016  . Cigarette smoker 09/08/2015  . Dyspnea 08/19/2015  . Upper airway cough syndrome 08/14/2015   Rondel Baton, MS, CCC-SLP Speech-Language Pathologist  Arlana Lindau 09/22/2017, 6:57 PM  [facility] [address] [place], [place], [address] Phone: [phone]   Fax:  [phone]   Name: Jessica Knapp MRN: [id number] Date of Birth: [birth year]

## 2017-09-26 ENCOUNTER — Encounter: Payer: Self-pay | Admitting: Speech Pathology

## 2017-09-29 ENCOUNTER — Other Ambulatory Visit: Payer: Self-pay

## 2017-09-29 ENCOUNTER — Ambulatory Visit: Payer: BLUE CROSS/BLUE SHIELD | Admitting: Family Medicine

## 2017-09-29 ENCOUNTER — Encounter: Payer: Self-pay | Admitting: Family Medicine

## 2017-09-29 ENCOUNTER — Ambulatory Visit: Payer: BLUE CROSS/BLUE SHIELD | Admitting: Speech Pathology

## 2017-09-29 VITALS — BP 128/74 | HR 88 | Temp 98.8°F | Wt 222.0 lb

## 2017-09-29 DIAGNOSIS — R69 Illness, unspecified: Secondary | ICD-10-CM | POA: Diagnosis not present

## 2017-09-29 DIAGNOSIS — J111 Influenza due to unidentified influenza virus with other respiratory manifestations: Secondary | ICD-10-CM

## 2017-09-29 MED ORDER — OSELTAMIVIR PHOSPHATE 75 MG PO CAPS: 75.0000 mg | ORAL_CAPSULE | Freq: Every day | ORAL | 0 refills | Status: DC

## 2017-09-29 NOTE — Progress Notes (Signed)
   Subjective:    Patient ID: Jessica Knapp is a 27 y.o. female presenting with Cough and Nasal Congestion  on 09/29/2017  HPI: Sore throat, congestion, sneezing, cough x 3 days. Taking sinus meds, alka seltzer and Mucinex. Thought it was allergies, but + sick contacts. No flu shot. Notes worsening symptoms.  Review of Systems  Constitutional: Positive for fever (low grade). Negative for chills.  HENT: Positive for congestion, rhinorrhea, sneezing and sore throat.   Respiratory: Negative for shortness of breath.   Cardiovascular: Negative for chest pain.  Gastrointestinal: Negative for abdominal pain, nausea and vomiting.  Genitourinary: Negative for dysuria.  Skin: Negative for rash.      Objective:    BP 128/74   Pulse 88   Temp 98.8 F (37.1 C) (Oral)   Wt 222 lb (100.7 kg)   LMP 09/06/2017   BMI 38.11 kg/m  Physical Exam  Constitutional: She is oriented to person, place, and time. She appears well-developed and well-nourished. No distress.  HENT:  Head: Normocephalic and atraumatic.  Eyes: No scleral icterus.  Neck: Neck supple.  Cardiovascular: Normal rate.  Pulmonary/Chest: Effort normal.  Abdominal: Soft.  Neurological: She is alert and oriented to person, place, and time.  Skin: Skin is warm and dry.  Psychiatric: She has a normal mood and affect.       Assessment & Plan:  Influenza-like illness - treat presumptively for flu with no h/o immunizaiton and + sick contacts, + how sick she feels and appears with fever. Stay hydrated. Continue symptomatic tx. - Plan: oseltamivir (TAMIFLU) 75 MG capsule   Total face-to-face time with patient: 15 minutes. Over 50% of encounter was spent on counseling and coordination of care. Return if symptoms worsen or fail to improve.  Reva Boresanya S Gerianne Simonet 09/29/2017 3:49 PM

## 2017-09-29 NOTE — Patient Instructions (Signed)

## 2017-10-03 ENCOUNTER — Ambulatory Visit: Payer: BLUE CROSS/BLUE SHIELD | Admitting: Speech Pathology

## 2017-10-03 ENCOUNTER — Other Ambulatory Visit: Payer: Self-pay | Admitting: *Deleted

## 2017-10-03 DIAGNOSIS — L500 Allergic urticaria: Secondary | ICD-10-CM

## 2017-10-03 DIAGNOSIS — J4530 Mild persistent asthma, uncomplicated: Secondary | ICD-10-CM

## 2017-10-03 DIAGNOSIS — J3089 Other allergic rhinitis: Secondary | ICD-10-CM

## 2017-10-03 NOTE — Telephone Encounter (Addendum)
Received fax from [facility] on [address] for refill request on Montelukast and levocetirizine faxed back denial to [phone]

## 2017-10-04 ENCOUNTER — Encounter: Payer: Self-pay | Admitting: Family Medicine

## 2017-10-04 ENCOUNTER — Ambulatory Visit: Payer: BLUE CROSS/BLUE SHIELD | Admitting: Family Medicine

## 2017-10-04 ENCOUNTER — Other Ambulatory Visit: Payer: Self-pay

## 2017-10-04 VITALS — BP 124/82 | HR 105 | Temp 98.6°F | Ht 64.0 in | Wt 222.4 lb

## 2017-10-04 DIAGNOSIS — B9689 Other specified bacterial agents as the cause of diseases classified elsewhere: Secondary | ICD-10-CM | POA: Diagnosis not present

## 2017-10-04 DIAGNOSIS — J329 Chronic sinusitis, unspecified: Secondary | ICD-10-CM | POA: Diagnosis not present

## 2017-10-04 MED ORDER — AMOXICILLIN-POT CLAVULANATE 875-125 MG PO TABS: 1.0000 | ORAL_TABLET | Freq: Two times a day (BID) | ORAL | 0 refills | Status: AC

## 2017-10-04 NOTE — Patient Instructions (Signed)
It was a pleasure to see you today! Thank you for choosing [facility] for your primary care. Jessica Knapp was seen for sinus infection.   Our plans for today were:  Take the antibiotic, and come back Thursday or Friday to make sure you are feeling better.   Best,  Dr. Chanetta Marshallimberlake

## 2017-10-04 NOTE — Progress Notes (Signed)
   CC: sore throat, fever, cough  HPI  Asthma - QID albuterol use last week. Worse with this illness. When not sick, maybe uses this twice per month. Feels SOB with this illness, unsure if she has been wheezing.  Cough, sore throat - No better from last visit, finished tamiflu without change in illness. HA, throat pain and soreness, ears feel full and painful. Last Monday this started. People at work had flu + and someone with pna. Started with fever, congestion, body aches. Chills this am. 103 2 days ago, when she last checked formal temp. HAs tried ibuprofen, apap, alkaseltzer cold and flu, mucinex, benadryl. + nausea, last vomit was last night (NBNB), last BM was yesterday and normal. Normal UOP.   ROS: Denies CP, abdominal pain, dysuria, changes in BMs. No rashes or sores.   CC, SH/smoking status, and VS noted  Objective: BP 124/82   Pulse (!) 105   Temp 98.6 F (37 C) (Oral)   Ht 5\' 4"  (1.626 m)   Wt 222 lb 6.4 oz (100.9 kg)   LMP 09/06/2017   SpO2 99%   BMI 38.17 kg/m  Gen: NAD, alert, cooperative, and pleasant. HEENT: NCAT, EOMI, PERRL.  TMs clear bilaterally, minimal oropharyngeal erythema. No LAD.  Tenderness to palpation over bilateral maxillary and frontal sinuses. CV: RRR, no murmur Resp: CTAB, no wheezes, non-labored, no focal findings. Abd: SNTND, BS present, no guarding or organomegaly Ext: No edema, warm Neuro: Alert and oriented, Speech clear, No gross deficits  Assessment and plan:  Cough, fever: Suspect bacterial sinusitis after viral URI, given tenderness on exam.  Additional possible etiologies include community-acquired pneumonia, however lung exam is nonfocal.  Offered patient chest x-ray to rule this out, she elects for treatment with Augmentin, which would cover community acquired pneumonia, and return for imaging if worsening.  Offered prednisone Dosepak for supportive care in sore throat and sinus pain, patient declines as she reports this worsens her  migraines.  Meds ordered this encounter  Medications  . amoxicillin-clavulanate (AUGMENTIN) 875-125 MG tablet    Sig: Take 1 tablet by mouth 2 (two) times daily for 7 days.    Dispense:  14 tablet    Refill:  0    Loni MuseKate Timberlake, MD, PGY2 10/05/2017 1:26 PM

## 2017-10-04 NOTE — Telephone Encounter (Signed)
Fax came over for levocetirizine refill, it has already been denied. Pt needs an OV

## 2017-10-06 ENCOUNTER — Telehealth: Payer: Self-pay

## 2017-10-06 ENCOUNTER — Encounter: Payer: Self-pay | Admitting: Speech Pathology

## 2017-10-06 NOTE — Telephone Encounter (Signed)
Denied RF on Levocetirizine. Overdue for appointment.

## 2017-10-07 ENCOUNTER — Ambulatory Visit: Payer: BLUE CROSS/BLUE SHIELD | Admitting: Internal Medicine

## 2017-10-12 ENCOUNTER — Encounter: Payer: Self-pay | Admitting: Speech Pathology

## 2017-10-13 ENCOUNTER — Ambulatory Visit: Payer: BLUE CROSS/BLUE SHIELD | Attending: Otolaryngology | Admitting: Speech Pathology

## 2017-10-13 DIAGNOSIS — R498 Other voice and resonance disorders: Secondary | ICD-10-CM | POA: Diagnosis not present

## 2017-10-13 NOTE — Patient Instructions (Signed)
VOICE CONSERVATION PROGRAM  1. Avoid overuse of voice or excessive use of the voice . The vocal cords can become easily fatigued. Try to sort out what is "necessary" versus "unnecessary" talking in your environment. You do not need to STOP talking, but try to limit it as much as possible.  . Think of resting your voice just as long as you talk. For example, if you talk for 5 minutes, rest your voice completely for 5 minutes. . If your voice feels "tired" during the day, try to rest it as much as possible.  2. Avoid using an excessively loud voice or shouting/raising your voice . When you yell or raise your voice, the vocal cords slam into each other, much like a strong hand clap. This causes irritation, and if this irritation continues, hoarseness may increase. . If people in your home talk loudly, ask them to reduce the volume of their voices to help you decrease your volume as well. . Sit near or face the person to whom you are speaking.   3. Avoid talking over background noise . When talking in background noise, speech automatically has increased loudness, and as in number 2 above, continued loud speech can result in increased hoarseness due to irritation to the vocal cords.  . Do not talk over the radio or the TV. Mute them before speaking, go to a quieter place to talk, or sit next to the person with whom you are watching.  4. Talk in a voice that is soft, smooth, and gentle . This allows the vocal cords to come together in a gentle way and allows the air being exhaled from the lungs to do most/all of the work when you are speaking.  5. Avoid excessive throat clearing, coughing, and loud laughter . During these activities the vocal cords can also be slammed together in a hard way and foster irritation or swelling, which can cause hoarseness. . Try taking sips of room temperature/cool water with hard swallows to clear secretions from the throat, instead of throat clearing or  coughing. . If you absolutely must clear your throat, do so as gently as possible. If you find yourself clearing your throat or coughing a lot, consult your physician. . You will want to laugh. Continue to do so, but softly and gently. Do not laugh loudly for long periods of time because this can increase the chances for vocal fold irritation and thus hoarseness. . Lozenges can help reduce the need to clear throat/cough during cold/allergy season.  6. Keep the mouth and throat lubricated . Drink at least 8-10 8 oz. glasses of water per day (64-80 oz.). This water can come in the form of drink mixes.  . Caffeinated beverages such as colas, coffee, and tea (hot tea AND sweet tea) dry out the vocal cords and then can cause irritation and thus hoarseness.  . Drinking water will keep your body hydrated. This will help to decrease secretions in the throat, which cause people to clear their throats or cough. . If you are exercising outside (especially during drier weather), try to breathe through your nose as much as possible. If this is not possible, lifting the tongue up behind the upper teeth when breathing through the mouth will add some moisture to the air breathed in past the vocal cords.  7. Avoid mouth breathing - breathe through your nose . Your nose serves as a natural filter for dust and dirt particles from the air, and as a humidifier to   moisten the air. Vocal cords like to work in moistened, filtered air. When you breathe through your mouth you lose the air filtering and moistening benefits of breathing through the nose. . Be aware of breathing patterns when sitting quietly (e.g., reading or watching TV). Increase your awareness and try to change habits from mouth breathing to nose breathing during those times.  8. Avoid environmental and/or ingested irritants . Try to avoid smoke-filled and or dusty environments. These items dry out the vocal cords and cause irritation.  9. Use an air filter  if the home is dusty, and/or a humidifier if the air is dry  . This will help to maintain clean, humid air to breathe. Remember, this type of air is what the vocal cords like best.  

## 2017-10-13 NOTE — Therapy (Signed)
[facility] [address] [place], [place], [address] Phone: [phone]   Fax:  [phone]  Speech Language Pathology Treatment  Patient Details  Name: Jessica Knapp MRN: [id number] Date of Birth: 10-12-1990 Referring Provider: Dr. Serena Colonel   Encounter Date: 10/13/2017  End of Session - 10/13/17 1624    Visit Number  5    Number of Visits  17    Date for SLP Re-Evaluation  10/21/17    SLP Start Time  1617    SLP Stop Time   1659    SLP Time Calculation (min)  42 min    Activity Tolerance  Patient tolerated treatment well       Past Medical History:  Diagnosis Date  . Asthma   . Chronic headaches   . GERD (gastroesophageal reflux disease)   . Recurrent upper respiratory infection (URI)   . Urticaria     Past Surgical History:  Procedure Laterality Date  . FINGER SURGERY    . HAND SURGERY      There were no vitals filed for this visit.  Subjective Assessment - 10/13/17 1622    Subjective  "It was horrible last week. I didn't have a voice at all."    Currently in Pain?  Yes    Pain Score  5     Pain Location  Throat    Pain Descriptors / Indicators  Sore    Pain Type  Acute pain    Pain Onset  In the past 7 days    Pain Frequency  Constant            ADULT SLP TREATMENT - 10/13/17 1623      General Information   Behavior/Cognition  Alert;Cooperative    Patient Positioning  Upright in chair      Treatment Provided   Treatment provided  Cognitive-Linquistic      Pain Assessment   Pain Assessment  No/denies pain      Cognitive-Linquistic Treatment   Treatment focused on  Voice    Skilled Treatment  Patient states she had the flu and pneumonia, states "my voice is just coming back." She states loss of voice for ~2 weeks, but reports vocal hygiene strategies (using humidifier, avoiding throat clearing, drinking water). Mild hoarseness noted initially, but pt independently began abdominal  breathing at rest upon entering treatment room, extending this into sentence responses 95% accuracy. In 15 minutes mod complex conversation x2, pt used AB 85% accuracy. SLP educated suggested additional vocal hygiene strategies, including building in periods of voice rest into her work day after vocal demands. Pt agrees her voice is "almost back to normal" and states she feels she would have had prolonged voice loss had she not been using voice techniques.      Assessment / Recommendations / Plan   Plan  Continue with current plan of care;Goals updated      Progression Toward Goals   Progression toward goals  Progressing toward goals       SLP Education - 10/13/17 1726    Education provided  Yes    Education Details  build in periods of voice rest during the work day    Person(s) Educated  Patient    Methods  Explanation    Comprehension  Verbalized understanding       SLP Short Term Goals - 10/13/17 1629      SLP SHORT TERM GOAL #1   Title  pt will use abdominal breathing (AB)  at rest 85% of the time over 5 minute period x3 visits    Status  Achieved      SLP SHORT TERM GOAL #2   Title  pt will demo AB in 14/20 sentence responses x2 sessions     Baseline  09/05/17, 09/22/17, 10/13/17    Time  1    Status  Achieved      SLP SHORT TERM GOAL #3   Title  pt will report carryover of at least 3 vocal hygiene strategies.    Time  1    Period  Weeks    Status  Achieved      SLP SHORT TERM GOAL #4   Title  pt will use balanced oral-nasal resonance with resonant voice therapy at the sentence level with 80% accuracy.    Status  Deferred       SLP Long Term Goals - 10/13/17 1642      SLP LONG TERM GOAL #1   Title  pt will demo AB 80% of the time in 15 minutes simple-mod complex conversation x3 visits     Baseline  09/05/17, 09/22/17, 10/13/17    Time  5    Period  Weeks    Status  Revised      SLP LONG TERM GOAL #2   Title  pt will use WNL vocal quality and loudness in 10 minutes  simple conversation x4 visits    Baseline  09/05/17 09/22/17, 10/13/17    Time  5    Period  Weeks    Status  Revised      SLP LONG TERM GOAL #3   Title  pt will report carryover of at least 4 vocal hygiene strategies.     Time  5    Period  Weeks    Status  On-going      SLP LONG TERM GOAL #4   Title  Pt will report improved voice-related quality of life as measured by VRQOL.    Time  5    Period  Weeks    Status  On-going       Plan - 10/13/17 1725    Clinical Impression Statement  Mild hoarseness noted on arrival. Pt utilized abdominal breathing in mod complex conversation with 85% accuracy; self-monitoring for vocal quality. Volume subjectively WNL; no throat clearing noted today. Pt continues to make good progress; revised LTG and will continue skilled ST for one additional session to ensure carryover of vocal hygiene techniques and vocal quality.    Speech Therapy Frequency  1x /week    Treatment/Interventions  Cueing hierarchy;Functional tasks;SLP instruction and feedback;Patient/family education;Other (comment)    Potential to Achieve Goals  Good    Consulted and Agree with Plan of Care  Patient       Patient will benefit from skilled therapeutic intervention in order to improve the following deficits and impairments:   Other voice and resonance disorders    Problem List Patient Active Problem List   Diagnosis Date Noted  . Irregular menstrual bleeding 09/21/2017  . Cervical cancer screening 07/29/2017  . Encounter for contraceptive management 07/29/2017  . Hoarseness of voice 06/09/2017  . Sore throat 05/14/2017  . Streptococcal sore throat 04/29/2017  . Seasonal and perennial allergic rhinitis 01/25/2017  . Allergic conjunctivitis 01/25/2017  . Mild persistent asthma 01/25/2017  . 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 Allergic urticaria 01/25/2017  . Laryngitis 12/13/2016  . URI (upper respiratory infection) 12/06/2016  . Amenorrhea 11/11/2016  . Frequently  sick 08/13/2016  . Carpal tunnel syndrome 08/13/2016  . Migraines 08/13/2016  . Cigarette smoker 09/08/2015  . Dyspnea 08/19/2015  . Upper airway cough syndrome 08/14/2015   Rondel Baton, MS, CCC-SLP Speech-Language Pathologist  Arlana Lindau 10/13/2017, 5:29 PM  [facility] [address] [place], [place], [address] Phone: [phone]   Fax:  [phone]   Name: Jessica Knapp MRN: [id number] Date of Birth: July 26, 1990

## 2017-10-17 ENCOUNTER — Encounter: Payer: Self-pay | Admitting: Speech Pathology

## 2017-10-20 ENCOUNTER — Ambulatory Visit: Payer: BLUE CROSS/BLUE SHIELD | Admitting: Speech Pathology

## 2017-10-24 ENCOUNTER — Ambulatory Visit: Payer: BLUE CROSS/BLUE SHIELD | Admitting: Speech Pathology

## 2017-10-24 DIAGNOSIS — R498 Other voice and resonance disorders: Secondary | ICD-10-CM

## 2017-10-24 NOTE — Therapy (Signed)
[facility] [address] [place], [place], [address] Phone: [phone]   Fax:  [phone]  Speech Language Pathology Treatment and Discharge Summary  Patient Details  Name: Jessica Knapp MRN: [id number] Date of Birth: Jan 13, 1991 Referring Provider: Dr. Izora Gala   Encounter Date: 10/24/2017  End of Session - 10/24/17 1745    Visit Number  6    Number of Visits  17    Date for SLP Re-Evaluation  10/21/17    SLP Start Time  1620    SLP Stop Time   1643    SLP Time Calculation (min)  23 min    Activity Tolerance  Patient tolerated treatment well       Past Medical History:  Diagnosis Date  . Asthma   . Chronic headaches   . GERD (gastroesophageal reflux disease)   . Recurrent upper respiratory infection (URI)   . Urticaria     Past Surgical History:  Procedure Laterality Date  . FINGER SURGERY    . HAND SURGERY      There were no vitals filed for this visit.  Subjective Assessment - 10/24/17 1624    Subjective  "It's fine as long as I stay away from the outside."    Currently in Pain?  Yes    Pain Score  3     Pain Location  Throat    Pain Descriptors / Indicators  Sore    Pain Type  Acute pain    Pain Onset  1 to 4 weeks ago            ADULT SLP TREATMENT - 10/24/17 1622      General Information   Behavior/Cognition  Alert;Cooperative    Patient Positioning  Upright in chair      Treatment Provided   Treatment provided  Cognitive-Linquistic      Cognitive-Linquistic Treatment   Treatment focused on  Voice    Skilled Treatment   V-RQOL raw score improved from 35 to 19 ; this is a calculated score of 77.5 (>80 WNL). Pt reports adhering to vocal hygiene strategies and has been taking vocal rest breaks at work. Abdominal breathing in 20 minutes mod complex conversation 90% accuracy; vocal quality WNL.       Assessment / Recommendations / Plan   Plan  Discharge SLP treatment due to  (comment) goals met      Progression Toward Goals   Progression toward goals  Goals met, education completed, patient discharged from [facility] - 10/24/17 1742      SLP SHORT TERM GOAL #1   Title  pt will use abdominal breathing (AB) at rest 85% of the time over 5 minute period x3 visits    Status  Achieved      SLP SHORT TERM GOAL #2   Title  pt will demo AB in 14/20 sentence responses x2 sessions     Status  Achieved      SLP SHORT TERM GOAL #3   Title  pt will report carryover of at least 3 vocal hygiene strategies.    Status  Achieved      SLP SHORT TERM GOAL #4   Title  pt will use balanced oral-nasal resonance with resonant voice therapy at the sentence level with 80% accuracy.    Status  Deferred       SLP Long Term Goals - 10/24/17 1627  SLP LONG TERM GOAL #1   Title  pt will demo AB 80% of the time in 15 minutes simple-mod complex conversation x3 visits     Baseline  09/05/17, 09/22/17, 10/13/17    Time  4    Period  Weeks    Status  Achieved      SLP LONG TERM GOAL #2   Title  pt will use WNL vocal quality and loudness in 10 minutes simple conversation x4 visits    Baseline  09/05/17 09/22/17, 10/13/17, 10/24/17    Time  5    Period  Weeks    Status  Achieved      SLP LONG TERM GOAL #3   Title  pt will report carryover of at least 4 vocal hygiene strategies.     Time  4    Period  Weeks    Status  Achieved      SLP LONG TERM GOAL #4   Title  Pt will report improved voice-related quality of life as measured by VRQOL.    Time  4    Period  Weeks    Status  Achieved       Plan - 10/24/17 1746    Clinical Impression Statement  Vocal quality WNL today. Pt utilized abdominal breathing in 20 minutes mod complex conversation with 90% accuracy; self-monitoring for vocal quality. Volume subjectively WNL; no throat clearing noted today. Pt reports carryover of 4 vocal hygiene strategies; reports improved voice-related quality of life per  VRQOL (calculated score is <1 SD below WNL). Pt pleased with current functional level and in agreement with d/c today. Long term goals met.    Speech Therapy Frequency  -- d/c    Duration  -- d/c    Treatment/Interventions  Cueing hierarchy;Functional tasks;SLP instruction and feedback;Patient/family education;Other (comment)    Potential to Achieve Goals  Good    Consulted and Agree with Plan of Care  Patient       Patient will benefit from skilled therapeutic intervention in order to improve the following deficits and impairments:   Other voice and resonance disorders   SPEECH THERAPY DISCHARGE SUMMARY  Visits from Start of Care: 6  Current functional level related to goals / functional outcomes: Pt using WNL vocal quality and abdominal breathing 90% accurate in 20 minutes mod complex conversation.   Remaining deficits: Pt continues to struggle with environmental impacts on vocal quality; she demonstrates carryover of vocal hygiene strategies and minimizes environmental triggers.   Education / Equipment: Vocal hygiene, abdominal breathing  Plan: Patient agrees to discharge.  Patient goals were met. Patient is being discharged due to meeting the stated rehab goals.  ?????         Problem List Patient Active Problem List   Diagnosis Date Noted  . Irregular menstrual bleeding 09/21/2017  . Cervical cancer screening 07/29/2017  . Encounter for contraceptive management 07/29/2017  . Hoarseness of voice 06/09/2017  . Sore throat 05/14/2017  . Streptococcal sore throat 04/29/2017  . Seasonal and perennial allergic rhinitis 01/25/2017  . Allergic conjunctivitis 01/25/2017  . Mild persistent asthma 01/25/2017  . 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 Allergic urticaria 01/25/2017  . Laryngitis 12/13/2016  . URI (upper respiratory infection) 12/06/2016  . Amenorrhea 11/11/2016  . Frequently sick 08/13/2016  . Carpal tunnel syndrome 08/13/2016  . Migraines 08/13/2016  .  Cigarette smoker 09/08/2015  . Dyspnea 08/19/2015  . Upper airway cough syndrome 08/14/2015   Deneise Lever, [place], [place] Speech-Language Pathologist  Aliene Altes 10/24/2017, 5:49 PM  [facility] [address] [place], [place], [address] Phone: [phone]   Fax:  [phone]   Name: Jessica Knapp MRN: [id number] Date of Birth: 1991-01-19

## 2017-11-14 ENCOUNTER — Ambulatory Visit (INDEPENDENT_AMBULATORY_CARE_PROVIDER_SITE_OTHER): Payer: BLUE CROSS/BLUE SHIELD | Admitting: Internal Medicine

## 2017-11-14 ENCOUNTER — Encounter: Payer: Self-pay | Admitting: Internal Medicine

## 2017-11-14 VITALS — BP 118/80 | HR 83 | Temp 98.6°F | Wt 225.8 lb

## 2017-11-14 DIAGNOSIS — J3089 Other allergic rhinitis: Secondary | ICD-10-CM | POA: Diagnosis not present

## 2017-11-14 DIAGNOSIS — L500 Allergic urticaria: Secondary | ICD-10-CM

## 2017-11-14 DIAGNOSIS — G43901 Migraine, unspecified, not intractable, with status migrainosus: Secondary | ICD-10-CM | POA: Diagnosis not present

## 2017-11-14 DIAGNOSIS — Z3202 Encounter for pregnancy test, result negative: Secondary | ICD-10-CM

## 2017-11-14 DIAGNOSIS — Z5181 Encounter for therapeutic drug level monitoring: Secondary | ICD-10-CM | POA: Diagnosis not present

## 2017-11-14 DIAGNOSIS — J4530 Mild persistent asthma, uncomplicated: Secondary | ICD-10-CM

## 2017-11-14 LAB — POCT URINE PREGNANCY: Preg Test, Ur: NEGATIVE

## 2017-11-14 MED ORDER — LEVOCETIRIZINE DIHYDROCHLORIDE 5 MG PO TABS: 5.0000 mg | ORAL_TABLET | Freq: Every evening | ORAL | 5 refills | Status: DC

## 2017-11-14 MED ORDER — KETOROLAC TROMETHAMINE 30 MG/ML IJ SOLN
30.0000 mg | Freq: Once | INTRAMUSCULAR | Status: AC
  Administered 2017-11-14: 30 mg via INTRAMUSCULAR

## 2017-11-14 MED ORDER — NORTRIPTYLINE HCL 25 MG PO CAPS: 25.0000 mg | ORAL_CAPSULE | Freq: Every day | ORAL | 3 refills | Status: DC

## 2017-11-14 MED ORDER — ELETRIPTAN HYDROBROMIDE 40 MG PO TABS: ORAL_TABLET | ORAL | 2 refills | Status: DC

## 2017-11-14 MED ORDER — MONTELUKAST SODIUM 10 MG PO TABS: 10.0000 mg | ORAL_TABLET | Freq: Every day | ORAL | 5 refills | Status: DC

## 2017-11-14 MED ORDER — CYCLOBENZAPRINE HCL 5 MG PO TABS: ORAL_TABLET | ORAL | 0 refills | Status: DC

## 2017-11-14 NOTE — Patient Instructions (Signed)
Jessica Knapp,  Please resume nortriptyline at night. Resume allergy medications of singulair and xyzal.   Your migraine is likely worsened by muscle tension. Try flexeril 5 mg at bedtime.   If you have no improvement in a couple days, please see [place] back.   Please keep a diary of your menstrual cycle.   Best, Dr. Sampson Goon
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
## 2017-11-16 ENCOUNTER — Encounter: Payer: Self-pay | Admitting: Internal Medicine

## 2017-11-16 NOTE — Assessment & Plan Note (Signed)
-   Increased muscle tension over lower back on exam. Suggested 5 mg flexeril QHS prn.
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
## 2017-11-29 ENCOUNTER — Ambulatory Visit (INDEPENDENT_AMBULATORY_CARE_PROVIDER_SITE_OTHER): Payer: BLUE CROSS/BLUE SHIELD | Admitting: Family Medicine

## 2017-11-29 ENCOUNTER — Other Ambulatory Visit: Payer: Self-pay

## 2017-11-29 ENCOUNTER — Encounter: Payer: Self-pay | Admitting: Family Medicine

## 2017-11-29 VITALS — BP 132/78 | HR 102 | Temp 98.3°F | Ht 64.0 in | Wt 226.0 lb

## 2017-11-29 DIAGNOSIS — G43809 Other migraine, not intractable, without status migrainosus: Secondary | ICD-10-CM

## 2017-11-29 DIAGNOSIS — R112 Nausea with vomiting, unspecified: Secondary | ICD-10-CM

## 2017-11-29 MED ORDER — PROMETHAZINE HCL 25 MG PO TABS
25.0000 mg | ORAL_TABLET | Freq: Once | ORAL | Status: AC
Start: 2017-11-29 — End: 2017-11-29
  Administered 2017-11-29: 25 mg via ORAL

## 2017-11-29 MED ORDER — DEXAMETHASONE SODIUM PHOSPHATE 100 MG/10ML IJ SOLN
10.0000 mg | Freq: Once | INTRAMUSCULAR | Status: AC
  Administered 2017-11-29: 10 mg via INTRAMUSCULAR

## 2017-11-29 MED ORDER — KETOROLAC TROMETHAMINE 30 MG/ML IJ SOLN
30.0000 mg | Freq: Once | INTRAMUSCULAR | Status: AC
  Administered 2017-11-29: 30 mg via INTRAMUSCULAR

## 2017-11-29 NOTE — Patient Instructions (Signed)

## 2017-11-29 NOTE — Assessment & Plan Note (Signed)
No signs of meningeal irritation, no neurologic deficit. Decadrone 10 mg, Toradol 30 mg IM was given/ Phenergan 25 mg PO was given for N/V Some improvement of her symptoms after treatment and rest in the examination room. Continue home medication as instructed. Rest at home today. May return to work tomorrow if symptoms improved. F/U as needed.

## 2017-11-29 NOTE — Progress Notes (Signed)
Subjective:     Patient ID: Jessica Knapp, female   DOB: 08/18/1990, 27 y.o.   MRN: [id number]  Migraine   This is a recurrent problem. Episode onset: Started 4 days ago. The problem occurs constantly. The problem has been unchanged. The pain is located in the left unilateral and frontal region. Radiates to: Moves around on and off, currently it is just on the left side. The pain quality is similar to prior headaches. The quality of the pain is described as throbbing. The pain is at a severity of 10/10. The pain is moderate. Associated symptoms include blurred vision, insomnia, nausea and vomiting. Pertinent negatives include no dizziness, facial sweating, fever, numbness, phonophobia, photophobia or seizures. Associated symptoms comments: Intermittent blurry vision. The symptoms are aggravated by bright light and activity. Treatments tried: Relpax, Nortriptyline. The treatment provided mild relief. Her past medical history is significant for migraine headaches.   Current Outpatient Medications on File Prior to Visit  Medication Sig Dispense Refill  . eletriptan (RELPAX) 40 MG tablet Take 1 tablet earliest onset of migraine.  May repeat once in 2 hours if headache persists or recurs. 10 tablet 2  . nortriptyline (PAMELOR) 25 MG capsule Take 1 capsule (25 mg total) by mouth at bedtime. 30 capsule 3  . albuterol (PROVENTIL HFA;VENTOLIN HFA) 108 (90 Base) MCG/ACT inhaler Inhale 2 puffs into the lungs every 6 (six) hours as needed for wheezing or shortness of breath. 1 Inhaler 2  . APRI 0.15-30 MG-MCG tablet Take 1 tablet by mouth daily. 3 Package 3  . Azelastine HCl 0.15 % SOLN Place 2 sprays into both nostrils 2 (two) times daily. 30 mL 5  . cyclobenzaprine (FLEXERIL) 5 MG tablet Take 1-2 tablets at bedtime for muscle spasm. 30 tablet 0  . famotidine (PEPCID) 20 MG tablet Take 1 tablet (20 mg total) by mouth at bedtime. (Patient taking differently: Take 20 mg by mouth daily as needed for heartburn. ) 30  tablet 5  . levocetirizine (XYZAL) 5 MG tablet Take 1 tablet (5 mg total) by mouth every evening. 30 tablet 5  . montelukast (SINGULAIR) 10 MG tablet Take 1 tablet (10 mg total) by mouth at bedtime. 30 tablet 5  . Olopatadine HCl (PATADAY) 0.2 % SOLN Place 1 drop into both eyes 1 day or 1 dose. 1 Bottle 5   No current facility-administered medications on file prior to visit.    Past Medical History:  Diagnosis Date  . Asthma   . Chronic headaches   . GERD (gastroesophageal reflux disease)   . Recurrent upper respiratory infection (URI)   . Urticaria    Vitals:   11/29/17 1014  BP: 132/78  Pulse: (!) 102  Temp: 98.3 F (36.8 C)  TempSrc: Oral  SpO2: 99%  Weight: 226 lb (102.5 kg)  Height: 5\' 4"  (1.626 m)      Review of Systems  Constitutional: Negative for fever.  Eyes: Positive for blurred vision. Negative for photophobia.  Respiratory: Negative.   Cardiovascular: Negative.   Gastrointestinal: Positive for nausea and vomiting.  Neurological: Positive for headaches. Negative for dizziness, tremors, seizures and numbness.  Psychiatric/Behavioral: The patient has insomnia.   All other systems reviewed and are negative.      Objective:   Physical Exam  Constitutional: She is oriented to person, place, and time. She appears well-developed. No distress.  HENT:  Head: Normocephalic.  Right Ear: Tympanic membrane normal.  Left Ear: Tympanic membrane normal.  Mouth/Throat: Oropharynx is clear and moist.  Eyes: Pupils are equal, round, and reactive to light. Conjunctivae, EOM and lids are normal.  Neck: Neck supple. No Brudzinski's sign and no Kernig's sign noted.  Cardiovascular: Normal rate, regular rhythm and normal heart sounds.  No murmur heard. Pulmonary/Chest: Effort normal and breath sounds normal. No stridor. No respiratory distress. She has no wheezes.  Abdominal: Soft. Bowel sounds are normal. She exhibits no distension and no mass. There is no tenderness.   Musculoskeletal: Normal range of motion. She exhibits no edema.  Neurological: She is alert and oriented to person, place, and time. She has normal strength. She displays normal reflexes. No cranial nerve deficit or sensory deficit. She displays a negative Romberg sign. She displays no Babinski's sign on the right side. She displays no Babinski's sign on the left side.  Nursing note and vitals reviewed.      Assessment:     Migraine Headache    Plan:     Check problem list.  More than 50% of this 30 min face to face encounter was spent on chart review,treatment and coordination of care.

## 2018-01-05 ENCOUNTER — Other Ambulatory Visit: Payer: Self-pay | Admitting: Internal Medicine

## 2018-01-10 IMAGING — CT CT ABD-PELV W/ CM
2 of 4 series · 16 of 46 positions shown, 18 images · IV contrast (ISOVUE)
Comparison: 02/24/2011.

CLINICAL DATA: Mid and right-sided abdominal pain x3 days with
vomiting x2 weeks. Suspect gastroenteritis or colitis.

EXAM:
CT ABDOMEN AND PELVIS WITH CONTRAST
TECHNIQUE: Multidetector CT imaging of the abdomen and pelvis was performed
using the standard protocol following bolus administration of
intravenous contrast.
CONTRAST:  100mL 1SYYX0-CZZ IOPAMIDOL (1SYYX0-CZZ) INJECTION 61%

[Series 2: abd/pel with · axial · 0.69mm/px · z∈[-433,-43]mm · 13 of 88 slices shown, 15 images]
[im 5/88  soft-tissue]
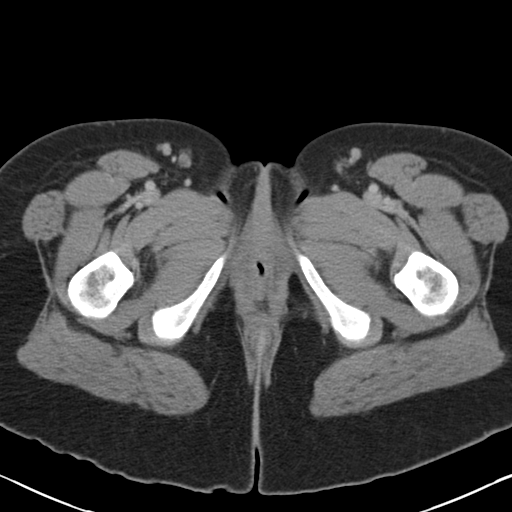
[im 5/88  bone]
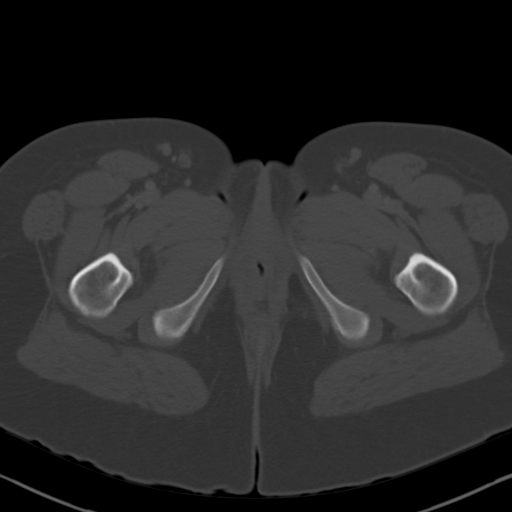
[im 14/88  soft-tissue]
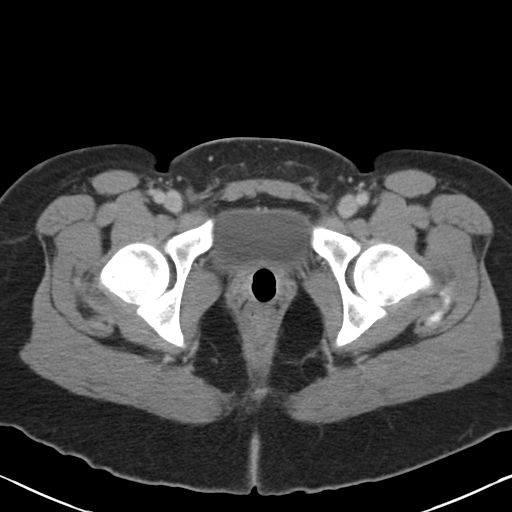
[im 18/88  soft-tissue]
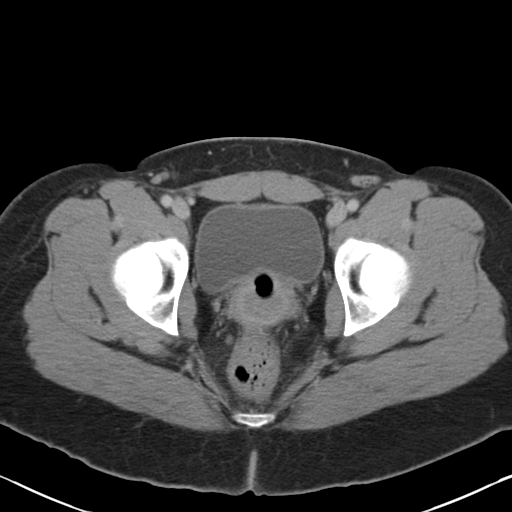
[im 27/88  soft-tissue]
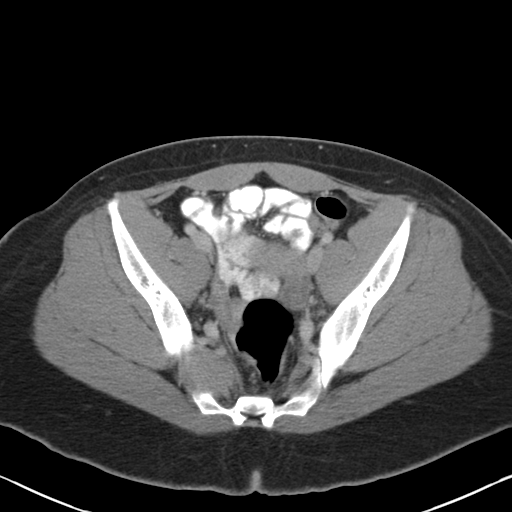
[im 31/88  soft-tissue]
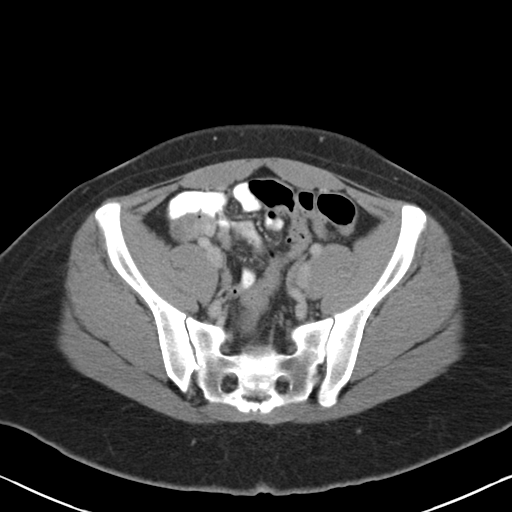
[im 40/88  soft-tissue]
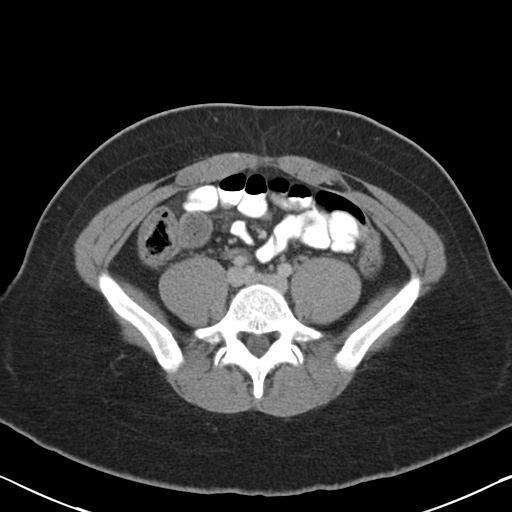
[im 44/88  soft-tissue]
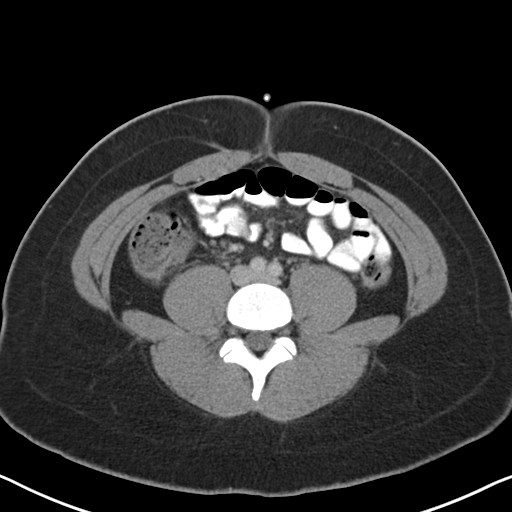
[im 48/88  soft-tissue]
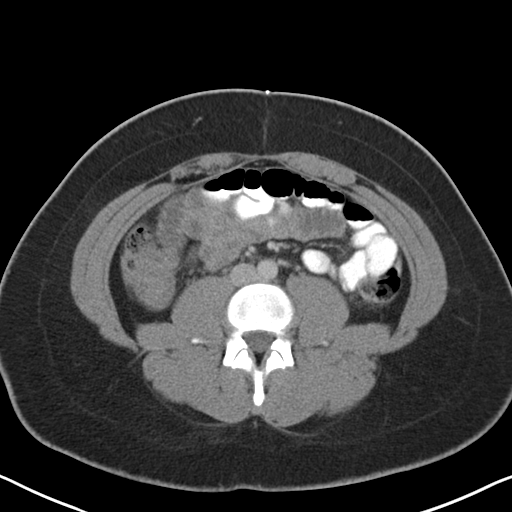
[im 57/88  soft-tissue]
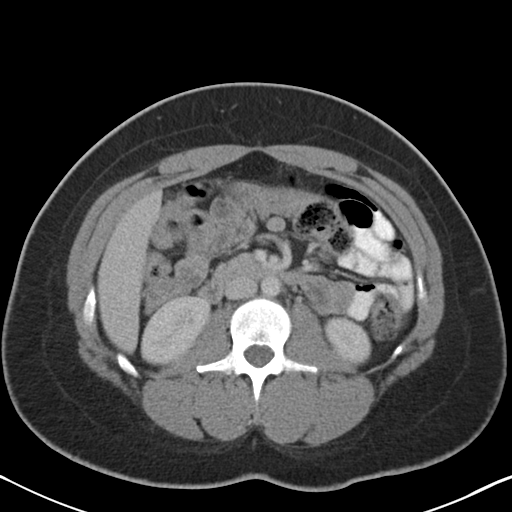
[im 57/88  bone]
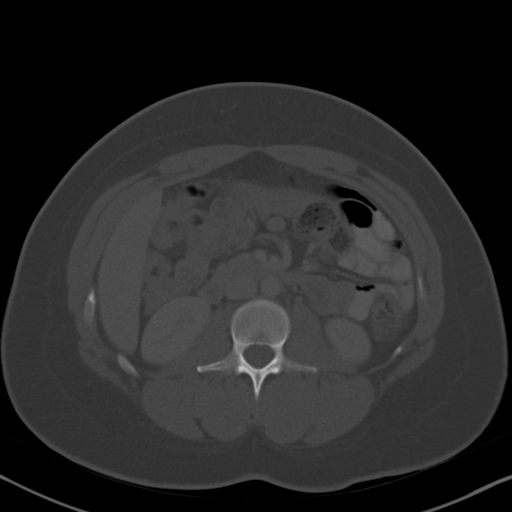
[im 61/88  soft-tissue]
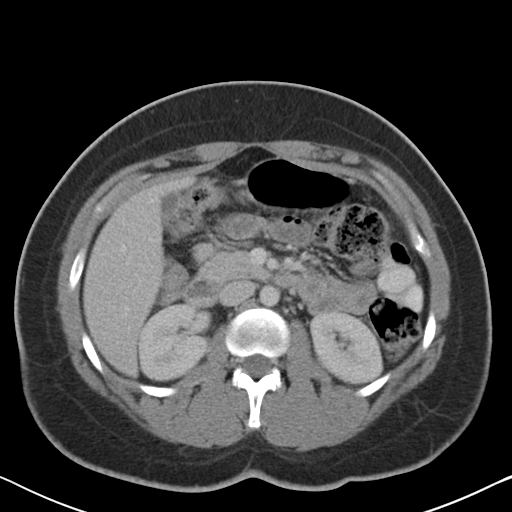
[im 70/88  soft-tissue]
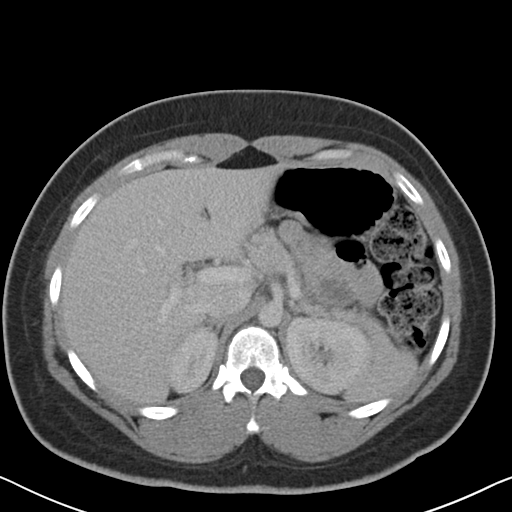
[im 74/88  soft-tissue]
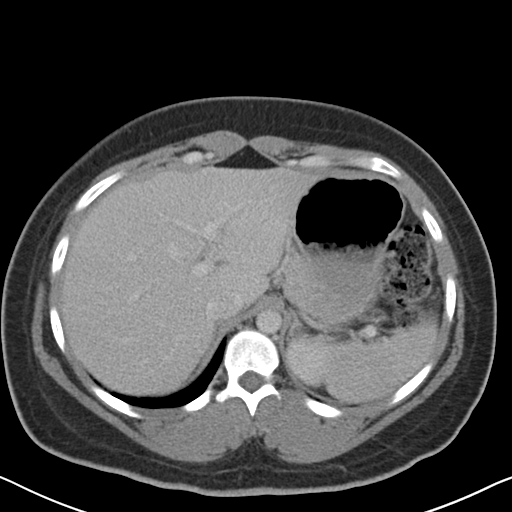
[im 83/88  soft-tissue]
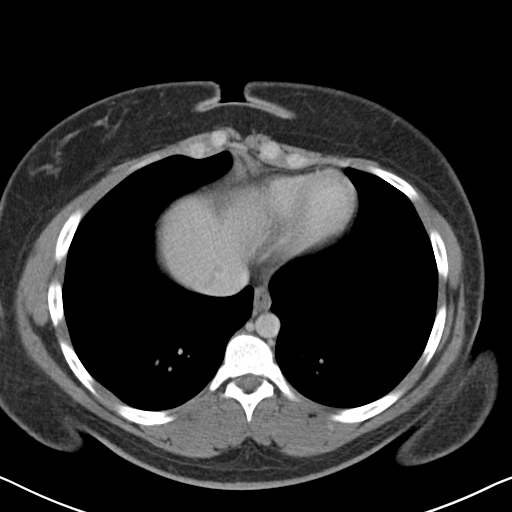

[Series 5: coronal a/|p · coronal · 0.67mm/px · 3 of 125 slices shown]
[im 42/125  soft-tissue]
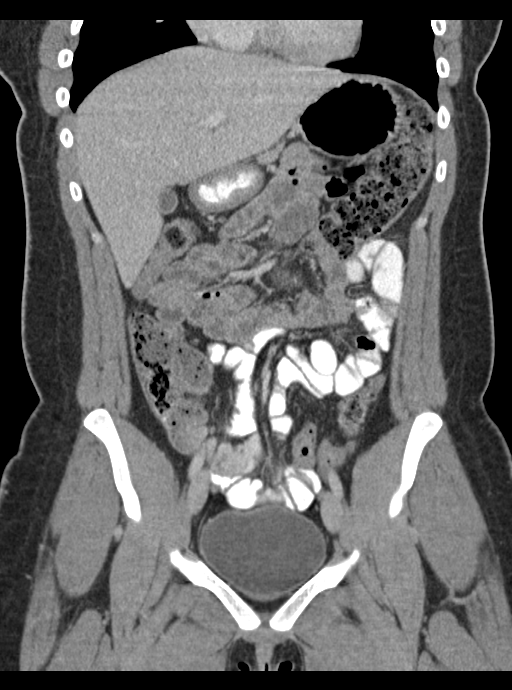
[im 56/125  soft-tissue]
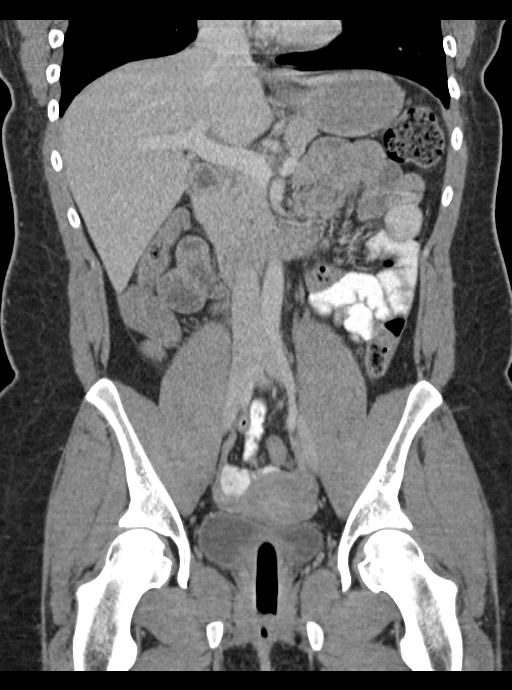
[im 69/125  soft-tissue]
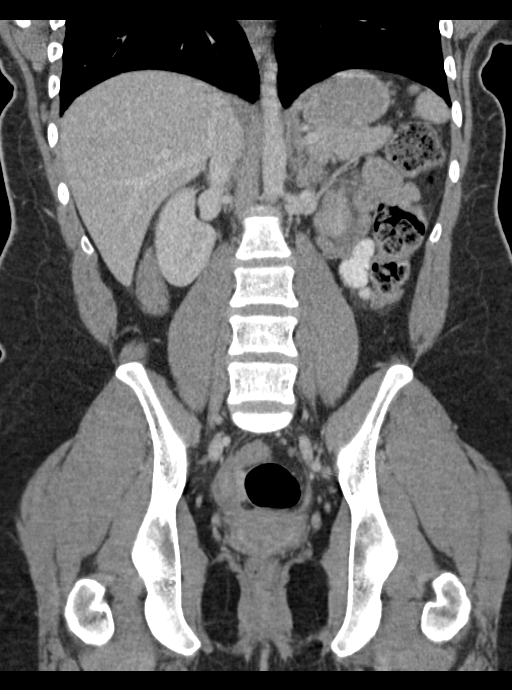

[16 of 46 positions shown; findings below may reference images not displayed]

FINDINGS: LOWER CHEST: Lung bases are clear. Included heart size is normal. No
pericardial effusion.

HEPATOBILIARY: Liver and gallbladder are normal.

PANCREAS: Normal.

SPLEEN: Normal.

ADRENALS/URINARY TRACT: Kidneys are orthotopic, demonstrating
symmetric enhancement. No nephrolithiasis, hydronephrosis or solid
renal masses. Urinary bladder is partially distended and
unremarkable. Normal adrenal glands.

STOMACH/BOWEL: The stomach, small and large bowel are normal in
course. Mild mural thickening of the distal stomach with mild
fluid-filled distention of proximal small intestine raise the
possibility of a gastroenteritis. Moderate stool burden is seen
within the colon without large bowel inflammation.

VASCULAR/LYMPHATIC: Aortoiliac vessels are normal in course and
caliber. No lymphadenopathy by CT size criteria.

REPRODUCTIVE: Normal.  Tampon artifact noted.

OTHER: No intraperitoneal free fluid or free air.

MUSCULOSKELETAL: Nonacute.
IMPRESSION: Slight mural thickening of the gastric antrum with mild fluid-filled
distention of proximal small intestine consistent with a
gastroenteritis. Otherwise negative.

## 2018-05-08 ENCOUNTER — Other Ambulatory Visit: Payer: Self-pay | Admitting: Family Medicine

## 2018-05-08 NOTE — Telephone Encounter (Signed)
Pt is calling to schedule her physical and med refill. She is scheduled for Dr. Shanda Bumps next available appointment 06/08/18. Pt will be out of her birth control medication this week. She would like to have enough sent to the pharmacy to last her until her next appointment.

## 2018-05-11 ENCOUNTER — Other Ambulatory Visit: Payer: Self-pay

## 2018-05-11 ENCOUNTER — Other Ambulatory Visit (HOSPITAL_COMMUNITY)
Admission: RE | Admit: 2018-05-11 | Discharge: 2018-05-11 | Disposition: A | Discharge status: Home / Self Care | Payer: BLUE CROSS/BLUE SHIELD | Source: Ambulatory Visit | Attending: Family Medicine | Admitting: Family Medicine

## 2018-05-11 ENCOUNTER — Encounter: Payer: Self-pay | Admitting: Family Medicine

## 2018-05-11 ENCOUNTER — Ambulatory Visit (INDEPENDENT_AMBULATORY_CARE_PROVIDER_SITE_OTHER): Payer: BLUE CROSS/BLUE SHIELD | Admitting: Family Medicine

## 2018-05-11 VITALS — BP 160/110 | HR 99 | Temp 98.5°F | Wt 222.4 lb

## 2018-05-11 DIAGNOSIS — N898 Other specified noninflammatory disorders of vagina: Secondary | ICD-10-CM

## 2018-05-11 DIAGNOSIS — Z3202 Encounter for pregnancy test, result negative: Secondary | ICD-10-CM | POA: Diagnosis not present

## 2018-05-11 DIAGNOSIS — N926 Irregular menstruation, unspecified: Secondary | ICD-10-CM

## 2018-05-11 LAB — POCT URINE PREGNANCY: Preg Test, Ur: NEGATIVE

## 2018-05-11 MED ORDER — NORGESTIMATE-ETH ESTRADIOL 0.25-35 MG-MCG PO TABS
1.0000 | ORAL_TABLET | Freq: Every day | ORAL | 3 refills | Status: DC
Start: 2018-05-11 — End: 2019-02-26

## 2018-05-11 NOTE — Patient Instructions (Addendum)
Thank you for coming to see me today. It was a pleasure! Today we talked about:   I have sent a different oral contraceptive to your pharamacy. Please take this instead of your Apri to help with bleeding and cramping. You may also start taking scheduled NSAID's (ibuprofen, naproxen, etc...). I would take 600 mg every 6 hours for cramping or 3 days prior to your period starting for bleeding and cramping.   Please check your blood pressure to ensure that it is normal outside of the doctor.   Please follow-up with your regular doctor as needed.  If you have any questions or concerns, please do not hesitate to call the office at [phone].  Take Care,   [place] Alaiyah Bollman, DO
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
## 2018-05-12 LAB — CERVICOVAGINAL ANCILLARY ONLY
Chlamydia: NEGATIVE
NEISSERIA GONORRHEA: NEGATIVE

## 2018-05-15 NOTE — Assessment & Plan Note (Signed)
Patient would benefit from IUD, but would like to try higher dose estrogen OCP today however.  Patient is obese and does have a history of smoking so discussed risk versus benefits of starting this medication.   -Started Sprintec today in office.   -Patient to follow-up with her regular PCP -Patient with ASCUS on last Pap smear but HSV negative with screening recommendation to follow-up in 3 years.  -Pregnancy test negative today in office.  GC/chlamydia also collected and negative in office. -Patient instructed on NSAID use.  If bleeding remains unresponsive to hormonal or medical therapy, sonohysterogram should be considered for focal lesion work-up.

## 2018-05-21 ENCOUNTER — Other Ambulatory Visit: Payer: Self-pay | Admitting: Internal Medicine

## 2018-05-30 ENCOUNTER — Telehealth: Payer: Self-pay | Admitting: Family Medicine

## 2018-05-30 NOTE — Telephone Encounter (Signed)
Called and spoke with patient over the phone. She has not checked her BP yet, but will do that by either coming here or checking it at home and giving us a call. Also reporting that's he is not feeling well and will call tomorrow for a possible ATC appointment if she is not feeling better.   [place] Kelby Adell, DO PGY-2, [facility]

## 2018-05-31 DIAGNOSIS — R05 Cough: Secondary | ICD-10-CM | POA: Diagnosis not present

## 2018-05-31 DIAGNOSIS — R509 Fever, unspecified: Secondary | ICD-10-CM | POA: Diagnosis not present

## 2018-05-31 DIAGNOSIS — R6889 Other general symptoms and signs: Secondary | ICD-10-CM | POA: Diagnosis not present

## 2018-06-08 ENCOUNTER — Ambulatory Visit (INDEPENDENT_AMBULATORY_CARE_PROVIDER_SITE_OTHER): Payer: BLUE CROSS/BLUE SHIELD | Admitting: Family Medicine

## 2018-06-08 ENCOUNTER — Encounter: Payer: Self-pay | Admitting: Family Medicine

## 2018-06-08 ENCOUNTER — Other Ambulatory Visit (HOSPITAL_COMMUNITY)
Admission: RE | Admit: 2018-06-08 | Discharge: 2018-06-08 | Disposition: A | Discharge status: Home / Self Care | Payer: BLUE CROSS/BLUE SHIELD | Source: Ambulatory Visit | Attending: Family Medicine | Admitting: Family Medicine

## 2018-06-08 VITALS — BP 127/80 | HR 83 | Temp 98.2°F | Wt 217.4 lb

## 2018-06-08 DIAGNOSIS — Z124 Encounter for screening for malignant neoplasm of cervix: Secondary | ICD-10-CM

## 2018-06-08 DIAGNOSIS — Z01419 Encounter for gynecological examination (general) (routine) without abnormal findings: Secondary | ICD-10-CM

## 2018-06-08 MED ORDER — BENZONATATE 100 MG PO CAPS: 100.0000 mg | ORAL_CAPSULE | Freq: Two times a day (BID) | ORAL | 0 refills | Status: DC | PRN

## 2018-06-08 NOTE — Progress Notes (Addendum)
   Subjective:   Patient ID: Jessica Knapp    DOB: 12/09/1990, 27 y.o. female   MRN: [id number]  CC: pap, discuss STD results   HPI: Jessica Knapp is a 27 y.o. female who presents to clinic today for pap smear.  STD testing Patient had recent STD testing on 05/11/2018 however did not receive results of this.  She does not have any current symptoms.  Denies vaginal discharge, odor, irritation.  No symptoms of dysuria, frequency or urgency at this time.  Pap smear History of abnormal pap in 2017, ASC-US.  Advised to follow up in 1 year to repeat.   Health maintenance -Due for flu shot   ROS: See HPI for pertinent ROS.  Social: pt is an occasional cigar smoker.  Medications reviewed. Objective:   BP 127/80   Pulse 83   Temp 98.2 F (36.8 C) (Oral)   Wt 217 lb 6.4 oz (98.6 kg)   SpO2 100%   BMI 37.32 kg/m  Vitals and nursing note reviewed.  General: 27 year old female, NAD Neck: supple CV: RRR no MRG  Lungs: CTAB, normal effort  Abdomen: soft, NTND,+bs  Pelvic exam: VULVA: normal appearing vulva with no masses, tenderness or lesions, VAGINA: normal appearing vagina with normal color and discharge, no lesions, CERVIX: normal appearing cervix without discharge or lesions, UTERUS: uterus is normal size, shape, consistency and nontender. Skin: warm, dry Extremities: warm and well perfused  Assessment & Plan:    Cervical cancer screening Pap smear performed today History of ASCUS on Pap 06/2017 advised to repeat in 1 year. Follow-up results, will contact patient  Health maintenance -Flu shot declined   Freddrick MarchYashika Beverely Suen, MD [facility]

## 2018-06-08 NOTE — Patient Instructions (Signed)
It was nice seeing you today.  You were seen in clinic to discuss your STD results from last time as well as to get a repeat Pap smear.  I will call you to let you know the results of this once it returns.  Please call clinic if you have any questions in the meantime.  Freddrick MarchYashika Teruo Stilley MD

## 2018-06-08 NOTE — Addendum Note (Signed)
Addended by: Gilberto BetterSIMPSON, MICHELLE R on: 06/08/2018 04:02 PM   Modules accepted: Orders

## 2018-06-08 NOTE — Addendum Note (Signed)
Addended by: Freddrick MarchAMIN, Ruhama Lehew on: 06/08/2018 04:07 PM   Modules accepted: Orders

## 2018-06-13 LAB — CYTOLOGY - PAP: Diagnosis: NEGATIVE

## 2018-08-05 ENCOUNTER — Other Ambulatory Visit: Payer: Self-pay | Admitting: Internal Medicine

## 2018-08-05 DIAGNOSIS — J4530 Mild persistent asthma, uncomplicated: Secondary | ICD-10-CM

## 2018-08-05 DIAGNOSIS — J3089 Other allergic rhinitis: Secondary | ICD-10-CM

## 2018-08-07 MED ORDER — MONTELUKAST SODIUM 10 MG PO TABS: ORAL_TABLET | ORAL | 1 refills | Status: DC

## 2018-08-07 NOTE — Telephone Encounter (Signed)
First transmission failed.  Resent.  Jessica Knapp, CMA  

## 2018-08-07 NOTE — Addendum Note (Signed)
Addended by: Jone Baseman D on: 08/07/2018 12:27 PM   Modules accepted: Orders

## 2018-10-23 ENCOUNTER — Emergency Department (HOSPITAL_COMMUNITY)
Admission: EM | Admit: 2018-10-23 | Discharge: 2018-10-24 | Disposition: A | Discharge status: Home / Self Care | Payer: BLUE CROSS/BLUE SHIELD | Attending: Emergency Medicine | Admitting: Emergency Medicine

## 2018-10-23 ENCOUNTER — Other Ambulatory Visit: Payer: Self-pay

## 2018-10-23 ENCOUNTER — Encounter (HOSPITAL_COMMUNITY): Payer: Self-pay

## 2018-10-23 ENCOUNTER — Emergency Department (HOSPITAL_COMMUNITY): Payer: BLUE CROSS/BLUE SHIELD

## 2018-10-23 DIAGNOSIS — Z793 Long term (current) use of hormonal contraceptives: Secondary | ICD-10-CM | POA: Diagnosis not present

## 2018-10-23 DIAGNOSIS — Z79899 Other long term (current) drug therapy: Secondary | ICD-10-CM | POA: Insufficient documentation

## 2018-10-23 DIAGNOSIS — R05 Cough: Secondary | ICD-10-CM | POA: Insufficient documentation

## 2018-10-23 DIAGNOSIS — R059 Cough, unspecified: Secondary | ICD-10-CM

## 2018-10-23 DIAGNOSIS — Z87891 Personal history of nicotine dependence: Secondary | ICD-10-CM | POA: Insufficient documentation

## 2018-10-23 DIAGNOSIS — Z8709 Personal history of other diseases of the respiratory system: Secondary | ICD-10-CM | POA: Insufficient documentation

## 2018-10-23 DIAGNOSIS — R0789 Other chest pain: Secondary | ICD-10-CM | POA: Insufficient documentation

## 2018-10-23 DIAGNOSIS — R079 Chest pain, unspecified: Secondary | ICD-10-CM | POA: Diagnosis not present

## 2018-10-23 DIAGNOSIS — R0602 Shortness of breath: Secondary | ICD-10-CM | POA: Diagnosis not present

## 2018-10-23 DIAGNOSIS — E669 Obesity, unspecified: Secondary | ICD-10-CM | POA: Diagnosis not present

## 2018-10-23 DIAGNOSIS — Z6836 Body mass index (BMI) 36.0-36.9, adult: Secondary | ICD-10-CM | POA: Diagnosis not present

## 2018-10-23 DIAGNOSIS — Z20828 Contact with and (suspected) exposure to other viral communicable diseases: Secondary | ICD-10-CM | POA: Diagnosis not present

## 2018-10-23 LAB — BASIC METABOLIC PANEL
Anion gap: 13 (ref 5–15)
BUN: 8 mg/dL (ref 6–20)
CO2: 20 mmol/L — ABNORMAL LOW (ref 22–32)
Calcium: 9.5 mg/dL (ref 8.9–10.3)
Chloride: 104 mmol/L (ref 98–111)
Creatinine, Ser: 0.9 mg/dL (ref 0.44–1.00)
GFR calc Af Amer: >60 mL/min (ref >60)
GFR calc non Af Amer: >60 mL/min (ref >60)
Glucose, Bld: 111 mg/dL — ABNORMAL HIGH (ref 70–99)
Potassium: 3.7 mmol/L (ref 3.5–5.1)
Sodium: 137 mmol/L (ref 135–145)

## 2018-10-23 LAB — I-STAT BETA HCG BLOOD, ED (MC, WL, AP ONLY): I-stat hCG, quantitative: <5 m[IU]/mL (ref <5)

## 2018-10-23 LAB — CBC
HCT: 41.1 % (ref 36.0–46.0)
Hemoglobin: 13.7 g/dL (ref 12.0–15.0)
MCH: 32.7 pg (ref 26.0–34.0)
MCHC: 33.3 g/dL (ref 30.0–36.0)
MCV: 98.1 fL (ref 80.0–100.0)
Platelets: 328 10*3/uL (ref 150–400)
RBC: 4.19 MIL/uL (ref 3.87–5.11)
RDW: 11.9 % (ref 11.5–15.5)
WBC: 8.8 10*3/uL (ref 4.0–10.5)
nRBC: 0 % (ref 0.0–0.2)

## 2018-10-23 LAB — TROPONIN I: Troponin I: <0.03 ng/mL (ref <0.03)

## 2018-10-23 MED ORDER — SODIUM CHLORIDE 0.9% FLUSH: 3.0000 mL | Freq: Once | INTRAVENOUS | Status: DC

## 2018-10-23 NOTE — ED Triage Notes (Addendum)
Pt endorses chest pain, SOB, cough and fever x2 days. Pt reports pain 10/10 centralized, radiates down left arm and feels heavy. Pt alert, oriented and ambulatory. Pt traveled to [place] 1 month ago, but denies contact with sick persons.

## 2018-10-24 ENCOUNTER — Emergency Department (HOSPITAL_COMMUNITY): Payer: BLUE CROSS/BLUE SHIELD

## 2018-10-24 ENCOUNTER — Encounter (HOSPITAL_COMMUNITY): Payer: Self-pay | Admitting: Emergency Medicine

## 2018-10-24 DIAGNOSIS — R0602 Shortness of breath: Secondary | ICD-10-CM | POA: Diagnosis not present

## 2018-10-24 MED ORDER — IOHEXOL 350 MG/ML SOLN
100.0000 mL | Freq: Once | INTRAVENOUS | Status: AC | PRN
  Administered 2018-10-24: 02:00:00 100 mL via INTRAVENOUS
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
## 2018-10-24 NOTE — Discharge Instructions (Addendum)
Person Under Monitoring Name: Jessica Knapp  Location: [address] [place] [address]   Infection Prevention Recommendations for Individuals Confirmed to have, or Being Evaluated for, 2019 Novel Coronavirus (COVID-19) Infection Who Receive Care at Home  Individuals who are confirmed to have, or are being evaluated for, COVID-19 should follow the prevention steps below until a healthcare provider or local or state health department says they can return to normal activities.  Stay home except to get medical care You should restrict activities outside your home, except for getting medical care. Do not go to work, school, or public areas, and do not use public transportation or taxis.  Call ahead before visiting your doctor Before your medical appointment, call the healthcare provider and tell them that you have, or are being evaluated for, COVID-19 infection. This will help the healthcare providers office take steps to keep other people from getting infected. Ask your healthcare provider to call the local or state health department.  Monitor your symptoms Seek prompt medical attention if your illness is worsening (e.g., difficulty breathing). Before going to your medical appointment, call the healthcare provider and tell them that you have, or are being evaluated for, COVID-19 infection. Ask your healthcare provider to call the local or state health department.  Wear a facemask You should wear a facemask that covers your nose and mouth when you are in the same room with other people and when you visit a healthcare provider. People who live with or visit you should also wear a facemask while they are in the same room with you.  Separate yourself from other people in your home As much as possible, you should stay in a different room from other people in your home. Also, you should use a separate bathroom, if available.  Avoid sharing household items You should  not share dishes, drinking glasses, cups, eating utensils, towels, bedding, or other items with other people in your home. After using these items, you should wash them thoroughly with soap and water.  Cover your coughs and sneezes Cover your mouth and nose with a tissue when you cough or sneeze, or you can cough or sneeze into your sleeve. Throw used tissues in a lined trash can, and immediately wash your hands with soap and water for at least 20 seconds or use an alcohol-based hand rub.  Wash your [employer] your hands often and thoroughly with soap and water for at least 20 seconds. You can use an alcohol-based hand sanitizer if soap and water are not available and if your hands are not visibly dirty. Avoid touching your eyes, nose, and mouth with unwashed hands.   Prevention Steps for Caregivers and Household Members of Individuals Confirmed to have, or Being Evaluated for, COVID-19 Infection Being Cared for in the Home  If you live with, or provide care at home for, a person confirmed to have, or being evaluated for, COVID-19 infection please follow these guidelines to prevent infection:  Follow healthcare providers instructions Make sure that you understand and can help the patient follow any healthcare provider instructions for all care.  Provide for the patients basic needs You should help the patient with basic needs in the home and provide support for getting groceries, prescriptions, and other personal needs.  Monitor the patients symptoms If they are getting sicker, call his or her medical provider and tell them that the patient has, or is being evaluated for, COVID-19 infection. This will help the healthcare providers  office take steps to keep other people from getting infected. Ask the healthcare provider to call the local or state health department.  Limit the number of people who have contact with the patient If possible, have only one caregiver for the  patient. Other household members should stay in another home or place of residence. If this is not possible, they should stay in another room, or be separated from the patient as much as possible. Use a separate bathroom, if available. Restrict visitors who do not have an essential need to be in the home.  Keep older adults, very young children, and other sick people away from the patient Keep older adults, very young children, and those who have compromised immune systems or chronic health conditions away from the patient. This includes people with chronic heart, lung, or kidney conditions, diabetes, and cancer.  Ensure good ventilation Make sure that shared spaces in the home have good air flow, such as from an air conditioner or an opened window, weather permitting.  Wash your hands often Wash your hands often and thoroughly with soap and water for at least 20 seconds. You can use an alcohol based hand sanitizer if soap and water are not available and if your hands are not visibly dirty. Avoid touching your eyes, nose, and mouth with unwashed hands. Use disposable paper towels to dry your hands. If not available, use dedicated cloth towels and replace them when they become wet.  Wear a facemask and gloves Wear a disposable facemask at all times in the room and gloves when you touch or have contact with the patients blood, body fluids, and/or secretions or excretions, such as sweat, saliva, sputum, nasal mucus, vomit, urine, or feces.  Ensure the mask fits over your nose and mouth tightly, and do not touch it during use. Throw out disposable facemasks and gloves after using them. Do not reuse. Wash your hands immediately after removing your facemask and gloves. If your personal clothing becomes contaminated, carefully remove clothing and launder. Wash your hands after handling contaminated clothing. Place all used disposable facemasks, gloves, and other waste in a lined container before  disposing them with other household waste. Remove gloves and wash your hands immediately after handling these items.  Do not share dishes, glasses, or other household items with the patient Avoid sharing household items. You should not share dishes, drinking glasses, cups, eating utensils, towels, bedding, or other items with a patient who is confirmed to have, or being evaluated for, COVID-19 infection. After the person uses these items, you should wash them thoroughly with soap and water.  Wash laundry thoroughly Immediately remove and wash clothes or bedding that have blood, body fluids, and/or secretions or excretions, such as sweat, saliva, sputum, nasal mucus, vomit, urine, or feces, on them. Wear gloves when handling laundry from the patient. Read and follow directions on labels of laundry or clothing items and detergent. In general, wash and dry with the warmest temperatures recommended on the label.  Clean all areas the individual has used often Clean all touchable surfaces, such as counters, tabletops, doorknobs, bathroom fixtures, toilets, phones, keyboards, tablets, and bedside tables, every day. Also, clean any surfaces that may have blood, body fluids, and/or secretions or excretions on them. Wear gloves when cleaning surfaces the patient has come in contact with. Use a diluted bleach solution (e.g., dilute bleach with 1 part bleach and 10 parts water) or a household disinfectant with a label that says EPA-registered for coronaviruses. To make a  bleach solution at home, add 1 tablespoon of bleach to 1 quart (4 cups) of water. For a larger supply, add  cup of bleach to 1 gallon (16 cups) of water. Read labels of cleaning products and follow recommendations provided on product labels. Labels contain instructions for safe and effective use of the cleaning product including precautions you should take when applying the product, such as wearing gloves or eye protection and making sure you  have good ventilation during use of the product. Remove gloves and wash hands immediately after cleaning.  Monitor yourself for signs and symptoms of illness Caregivers and household members are considered close contacts, should monitor their health, and will be asked to limit movement outside of the home to the extent possible. Follow the monitoring steps for close contacts listed on the symptom monitoring form.   ? If you have additional questions, contact your local health department or call the epidemiologist on call at [phone] (available 24/7). ? This guidance is subject to change. For the most up-to-date guidance from [facility], please refer to their website: TripMetro.huhttps://www.cdc.gov/coronavirus/2019-ncov/hcp/guidance-prevent-spread.html     Person Under Monitoring Name: Jessica FlaxShayla S Knapp  Location: [address] [place] [id number]   CORONAVIRUS DISEASE 2019 (COVID-19) Guidance for Persons Under Investigation You are being tested for the virus that causes coronavirus disease 2019 (COVID-19). Public health actions are necessary to ensure protection of your health and the health of others, and to prevent further spread of infection. COVID-19 is caused by a virus that can cause symptoms, such as fever, cough, and shortness of breath. The primary transmission from person to person is by coughing or sneezing. On July 27, 2018, the World Health Organization announced a [employer] Emergency of International Concern and on July 28, 2018 the U.S. Department of Health and Human Services declared a public health emergency. If the virus that causesCOVID-19 spreads in the community, it could have severe public health consequences.  As a person under investigation for COVID-19, the [place] Department of Health and [employer], Division of [employer] advises you to adhere to the following guidance until your test results are reported to you. If your test result is  positive, you will receive additional information from your provider and your local health department at that time.  Remain at home until you are cleared by your health provider or public health authorities.  Keep a log of visitors to your home using the form provided. Any visitors to your home must be aware of your isolation status. If you plan to move to a new address or leave the county, notify the local health department in your county. Call a doctor or seek care if you have an urgent medical need. Before seeking medical care, call ahead and get instructions from the provider before arriving at the medical office, clinic or hospital. Notify them that you are being tested for the virus that causes COVID-19 so arrangements can be made, as necessary, to prevent transmission to others in the healthcare setting. Next, notify the local health department in your county. If a medical emergency arises and you need to call 911, inform the first responders that you are being tested for the virus that causes COVID-19. Next, notify the local health department in your county. Adhere to all guidance set forth by the [facility] of [employer] for [facility] of patients that is based on guidance from the Center for Disease Control and Prevention with suspected or confirmed COVID-19. It is  provided with this guidance for Persons Under Investigation.  Your health and the health of our community are our top priorities. Public Health officials remain available to provide assistance and counseling to you about COVID-19 and compliance with this guidance.  Provider: ____________________________________________________________ Date: ______/_____/_________  By signing below, you acknowledge that you have read and agree to comply with this Guidance for Persons Under Investigation. ______________________________________________________________ Date: ______/_____/_________  WHO DO I CALL? You can find a list  of local health departments here: [email or web address] Health Department: ____________________________________________________________________ Contact Name: ________________________________________________________________________ Telephone: ___________________________________________________________________________  Nedra Hai, Division of Public Health, Communicable Disease Branch COVID-19 Guidance for Persons Under Investigation September 02, 2018

## 2018-10-26 ENCOUNTER — Telehealth: Payer: Self-pay

## 2018-10-26 NOTE — Telephone Encounter (Signed)
Pt calls nurse line stating she was seen in ED 4/27 for cough. Per ED physician she is to self quarantine for 14 days. Pt stated she needs a note for work stating she can return 5/12, after [place]. Please advise.

## 2018-11-02 ENCOUNTER — Other Ambulatory Visit: Payer: Self-pay | Admitting: Family Medicine

## 2018-11-02 NOTE — Telephone Encounter (Signed)
Called patient to inform her of note for work and she states that she is still on quarantine until 11/07/2018 and will be here to pick letter up afterwards.  Glennie Hawk, CMA

## 2018-11-02 NOTE — Telephone Encounter (Signed)
I have completed this and left it at front desk for her to pick up. Please inform pt

## 2018-11-16 ENCOUNTER — Other Ambulatory Visit (HOSPITAL_COMMUNITY)
Admission: RE | Admit: 2018-11-16 | Discharge: 2018-11-16 | Disposition: A | Discharge status: Home / Self Care | Payer: BLUE CROSS/BLUE SHIELD | Source: Ambulatory Visit | Attending: Family Medicine | Admitting: Family Medicine

## 2018-11-16 ENCOUNTER — Ambulatory Visit: Payer: BLUE CROSS/BLUE SHIELD | Admitting: Family Medicine

## 2018-11-16 ENCOUNTER — Other Ambulatory Visit: Payer: Self-pay

## 2018-11-16 VITALS — BP 108/76 | HR 108

## 2018-11-16 DIAGNOSIS — N939 Abnormal uterine and vaginal bleeding, unspecified: Secondary | ICD-10-CM | POA: Diagnosis not present

## 2018-11-16 DIAGNOSIS — N898 Other specified noninflammatory disorders of vagina: Secondary | ICD-10-CM | POA: Insufficient documentation

## 2018-11-16 LAB — POCT WET PREP (WET MOUNT)
Clue Cells Wet Prep Whiff POC: POSITIVE
Trichomonas Wet Prep HPF POC: ABSENT

## 2018-11-16 LAB — POCT URINE PREGNANCY: Preg Test, Ur: NEGATIVE

## 2018-11-16 NOTE — Progress Notes (Signed)
urine

## 2018-11-16 NOTE — Assessment & Plan Note (Addendum)
Uncertain etiology.  History of regular cycles up until 1.5 months ago.  Not sexually active.  Negative urine pregnancy.  Low risk for STD.  No red flags.  Currently on high-dose estrogen combined OCP with known history of migraines with aura.  Discussed alternative options including Mirena, however patient would like to wait and think about alternative options for contraception. - Checking wet prep, GC/chlamydia, HIV, RPR - Discontinuing OCP and given information on Mirena - Informed she is now able to conceive once OCP is discontinued, reviewed return precautions - Patient to call and schedule follow-up in the next few weeks if she is interested

## 2018-11-16 NOTE — Progress Notes (Signed)
   Subjective   Patient ID: Jessica Knapp    DOB: 11-Nov-1990, 28 y.o. female   MRN: [id number]  CC: "spotting"  HPI: Jessica Knapp is a 28 y.o. female who presents to clinic today for the following:  VAGINAL BLEEDING  Having vaginal bleeding for 1.5 months. Bleeding is: spotting dime to quarter size Sex in last month: no Possible STD exposure: no Family history of uterine or vaginal cancer: great-grandmother had she thinks uterine cancer  Symptoms Weight loss: no Weight gain: no Trouble with vision: no Headaches: yes, has migraines on estrogen Dysuria: no Abdomen or pelvic pain: yes, lower abdominal pain Back pain: no Genital sores or ulcers: no Pain during sex: n/a  ROS: see HPI for pertinent.  PMFSH: Reviewed. Smoking status reviewed. Medications reviewed.  Objective   BP 108/76   Pulse (!) 108   LMP 09/22/2018 (Exact Date)   SpO2 97%  Vitals and nursing note reviewed.  General: well nourished, well developed, NAD with non-toxic appearance HEENT: normocephalic, atraumatic, moist mucous membranes Lungs: normal work of breathing Abdomen: soft, non-tender, non-distended, normoactive bowel sounds GU: accompanied by chaperone, no external rash or lesion, absent vaginal discharge with minimal blood in vaginal vault, nonfriable os, no adnexal tenderness Skin: warm, dry, no rashes or lesions, cap refill < 2 seconds Extremities: warm and well perfused, normal tone, no edema  Assessment & Plan   Abnormal uterine bleeding (AUB) Uncertain etiology.  History of regular cycles up until 1.5 months ago.  Not sexually active.  Negative urine pregnancy.  Low risk for STD.  No red flags.  Currently on high-dose estrogen combined OCP with known history of migraines with aura.  Discussed alternative options including Mirena, however patient would like to wait and think about alternative options for contraception. - Checking wet prep, GC/chlamydia, HIV, RPR - Discontinuing OCP and  given information on Mirena - Informed she is now able to conceive once OCP is discontinued, reviewed return precautions - Patient to call and schedule follow-up in the next few weeks if she is interested  Orders Placed This Encounter  Procedures  . RPR  . HIV antibody (with reflex)  . POCT urine pregnancy  . POCT Wet Prep [facility])   No orders of the defined types were placed in this encounter.   Durward Parcel, DO [facility], PGY-3 11/16/2018, 1:22 PM

## 2018-11-16 NOTE — Progress Notes (Signed)
Error

## 2018-11-16 NOTE — Patient Instructions (Signed)
Thank you for coming in to see us today. Please see below to review our plan for today's visit.  I will call you if there are any abnormalities to your lab results within 48 hours, otherwise you should receive results in the mail.  Discontinue the birth control due to your history of migraines.  Below is some information on the Mirena.  This is a very effective and safe method of both birth control and for controlling abnormal uterine bleeding.  Feel free to call and request to see me if you are interested in receiving this medication.  Please call the clinic at [phone] if your symptoms worsen or you have any concerns. It was our pleasure to serve you.  Durward Parcelavid Giles Currie, DO [facility], PGY-3    Levonorgestrel intrauterine device (IUD) What is this medicine? LEVONORGESTREL IUD (LEE voe nor jes trel) is a contraceptive (birth control) device. The device is placed inside the uterus by a healthcare professional. It is used to prevent pregnancy. This device can also be used to treat heavy bleeding that occurs during your period. This medicine may be used for other purposes; ask your health care provider or pharmacist if you have questions. COMMON BRAND NAME(S): Cameron AliKyleena, LILETTA, Mirena, Skyla What should I tell my health care provider before I take this medicine? They need to know if you have any of these conditions: -abnormal Pap smear -cancer of the breast, uterus, or cervix -diabetes -endometritis -genital or pelvic infection now or in the past -have more than one sexual partner or your partner has more than one partner -heart disease -history of an ectopic or tubal pregnancy -immune system problems -IUD in place -liver disease or tumor -problems with blood clots or take blood-thinners -seizures -use intravenous drugs -uterus of unusual shape -vaginal bleeding that has not been explained -an unusual or allergic reaction to levonorgestrel, other hormones, silicone,  or polyethylene, medicines, foods, dyes, or preservatives -pregnant or trying to get pregnant -breast-feeding How should I use this medicine? This device is placed inside the uterus by a health care professional. Talk to your pediatrician regarding the use of this medicine in children. Special care may be needed. Overdosage: If you think you have taken too much of this medicine contact a poison control center or emergency room at once. NOTE: This medicine is only for you. Do not share this medicine with others. What if I miss a dose? This does not apply. Depending on the brand of device you have inserted, the device will need to be replaced every 3 to 5 years if you wish to continue using this type of birth control. What may interact with this medicine? Do not take this medicine with any of the following medications: -amprenavir -bosentan -fosamprenavir This medicine may also interact with the following medications: -aprepitant -armodafinil -barbiturate medicines for inducing sleep or treating seizures -bexarotene -boceprevir -griseofulvin -medicines to treat seizures like carbamazepine, ethotoin, felbamate, oxcarbazepine, phenytoin, topiramate -modafinil -pioglitazone -rifabutin -rifampin -rifapentine -some medicines to treat HIV infection like atazanavir, efavirenz, indinavir, lopinavir, nelfinavir, tipranavir, ritonavir -St. John's wort -warfarin This list may not describe all possible interactions. Give your health care provider a list of all the medicines, herbs, non-prescription drugs, or dietary supplements you use. Also tell them if you smoke, drink alcohol, or use illegal drugs. Some items may interact with your medicine. What should I watch for while using this medicine? Visit your doctor or health care professional for regular check ups. See your doctor if you or  your partner has sexual contact with others, becomes HIV positive, or gets a sexual transmitted disease. This  product does not protect you against HIV infection (AIDS) or other sexually transmitted diseases. You can check the placement of the IUD yourself by reaching up to the top of your vagina with clean fingers to feel the threads. Do not pull on the threads. It is a good habit to check placement after each menstrual period. Call your doctor right away if you feel more of the IUD than just the threads or if you cannot feel the threads at all. The IUD may come out by itself. You may become pregnant if the device comes out. If you notice that the IUD has come out use a backup birth control method like condoms and call your health care provider. Using tampons will not change the position of the IUD and are okay to use during your period. This IUD can be safely scanned with magnetic resonance imaging (MRI) only under specific conditions. Before you have an MRI, tell your healthcare provider that you have an IUD in place, and which type of IUD you have in place. What side effects may I notice from receiving this medicine? Side effects that you should report to your doctor or health care professional as soon as possible: -allergic reactions like skin rash, itching or hives, swelling of the face, lips, or tongue -fever, flu-like symptoms -genital sores -high blood pressure -no menstrual period for 6 weeks during use -pain, swelling, warmth in the leg -pelvic pain or tenderness -severe or sudden headache -signs of pregnancy -stomach cramping -sudden shortness of breath -trouble with balance, talking, or walking -unusual vaginal bleeding, discharge -yellowing of the eyes or skin Side effects that usually do not require medical attention (report to your doctor or health care professional if they continue or are bothersome): -acne -breast pain -change in sex drive or performance -changes in weight -cramping, dizziness, or faintness while the device is being inserted -headache -irregular menstrual  bleeding within first 3 to 6 months of use -nausea This list may not describe all possible side effects. Call your doctor for medical advice about side effects. You may report side effects to FDA at 1-800-FDA-1088. Where should I keep my medicine? This does not apply. NOTE: This sheet is a summary. It may not cover all possible information. If you have questions about this medicine, talk to your doctor, pharmacist, or health care provider.  2019 Elsevier/Gold Standard (2016-03-26 14:14:56)

## 2018-11-17 LAB — RPR: RPR Ser Ql: NONREACTIVE

## 2018-11-17 LAB — CERVICOVAGINAL ANCILLARY ONLY
Chlamydia: POSITIVE — AB
Neisseria Gonorrhea: NEGATIVE

## 2018-11-17 LAB — HIV ANTIBODY (ROUTINE TESTING W REFLEX): HIV Screen 4th Generation wRfx: NONREACTIVE

## 2018-11-21 ENCOUNTER — Other Ambulatory Visit: Payer: Self-pay

## 2018-11-21 ENCOUNTER — Ambulatory Visit (INDEPENDENT_AMBULATORY_CARE_PROVIDER_SITE_OTHER): Payer: BLUE CROSS/BLUE SHIELD | Admitting: *Deleted

## 2018-11-21 ENCOUNTER — Telehealth: Payer: Self-pay | Admitting: Family Medicine

## 2018-11-21 DIAGNOSIS — A749 Chlamydial infection, unspecified: Secondary | ICD-10-CM

## 2018-11-21 DIAGNOSIS — A549 Gonococcal infection, unspecified: Secondary | ICD-10-CM | POA: Diagnosis not present

## 2018-11-21 MED ORDER — AZITHROMYCIN 500 MG PO TABS
1000.0000 mg | ORAL_TABLET | Freq: Once | ORAL | Status: AC
  Administered 2018-11-21: 12:00:00 1000 mg via ORAL

## 2018-11-21 MED ORDER — CEFTRIAXONE SODIUM 250 MG IJ SOLR
250.0000 mg | Freq: Once | INTRAMUSCULAR | Status: AC
  Administered 2018-11-21: 250 mg via INTRAMUSCULAR

## 2018-11-21 NOTE — Progress Notes (Signed)
Patient in nurse clinic today for STD treatment of Chlamydia.    Patient advised to abstain from sex for 7-10 days after treatment or when partner has been tested/treated.    Azithromycin 1 GM PO x 1 given and Ceftriaxone 250 mg IM x 1 given in LUOQ per Dr. Tamala Fothergill orders.  Patient observed 15 minutes in office.  No reaction noted.   Patient to follow up in 2-3 months for re-screening.    Offered condoms but patient declined.   STD report form fax completed and faxed to [facility] Department at [phone] (STD department).     Jone Baseman, CMA

## 2018-11-21 NOTE — Telephone Encounter (Signed)
Following up positive chlamydia test for vaginal spotting during visit on 11/16/2018.  Patient plans on coming to clinic later today for treatment with 1 g of oral azithromycin and 250 mg of IM ceftriaxone.  Patient aware of who she contracted chlamydia from and will inform partner.  Advised patient to avoid intercourse until treatment has been completed and infection has resolved.  Also instructed patient to inform partner so that they to can be treated.  Durward Parcel, DO [facility], PGY-3

## 2019-02-26 ENCOUNTER — Other Ambulatory Visit: Payer: Self-pay | Admitting: Family Medicine

## 2019-03-01 IMAGING — US US TRANSVAGINAL NON-OB
1 series · 14 of 25 positions shown · non-contrast
Comparison: CT of the abdomen and pelvis from 02/24/2011

CLINICAL DATA: Acute onset of pelvic pain.  Initial encounter.

EXAM:
TRANSABDOMINAL AND TRANSVAGINAL ULTRASOUND OF PELVIS
TECHNIQUE: Both transabdominal and transvaginal ultrasound examinations of the
pelvis were performed. Transabdominal technique was performed for
global imaging of the pelvis including uterus, ovaries, adnexal
regions, and pelvic cul-de-sac. It was necessary to proceed with
endovaginal exam following the transabdominal exam to visualize the
ovaries and adnexa in greater detail.

[Series 1: us transvaginal non-ob · 0.24mm/px · 62 acquisitions, 14 frames shown]
[im 1/62]
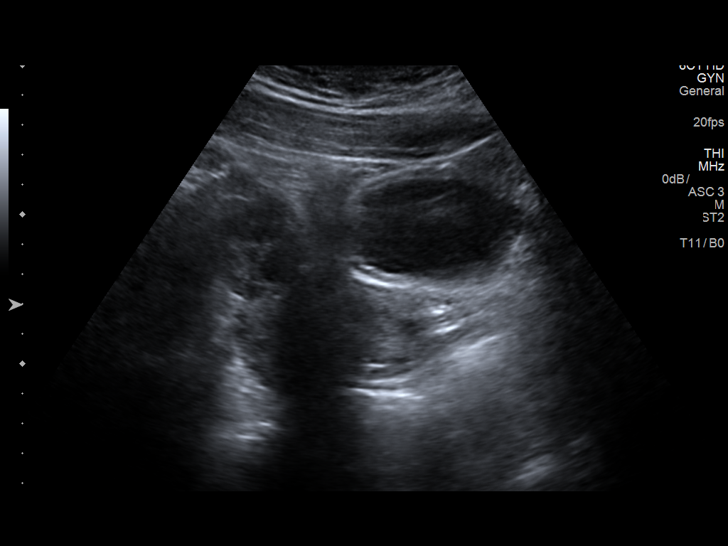
[im 6/62]
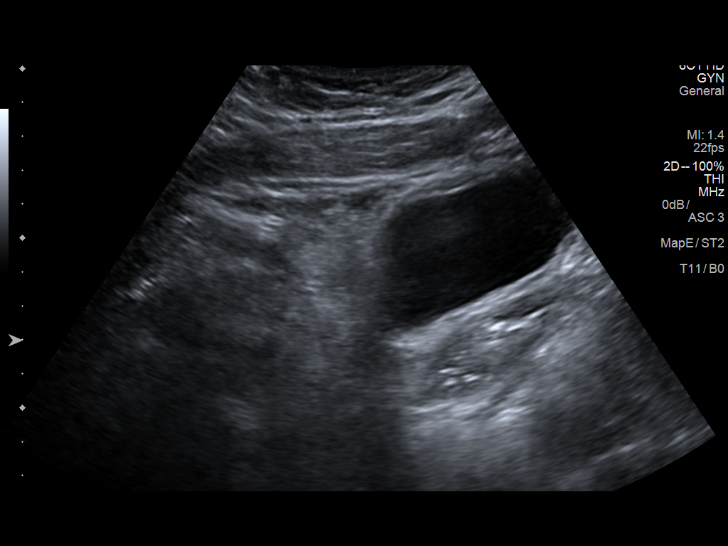
[im 11/62]
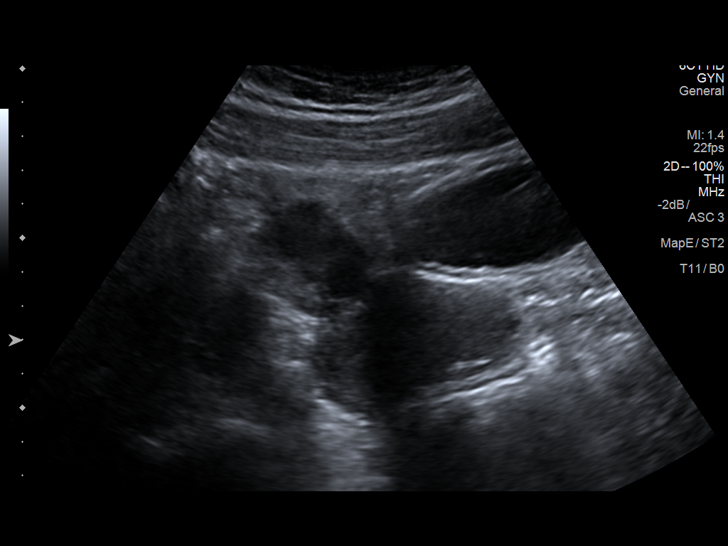
[im 16/62]
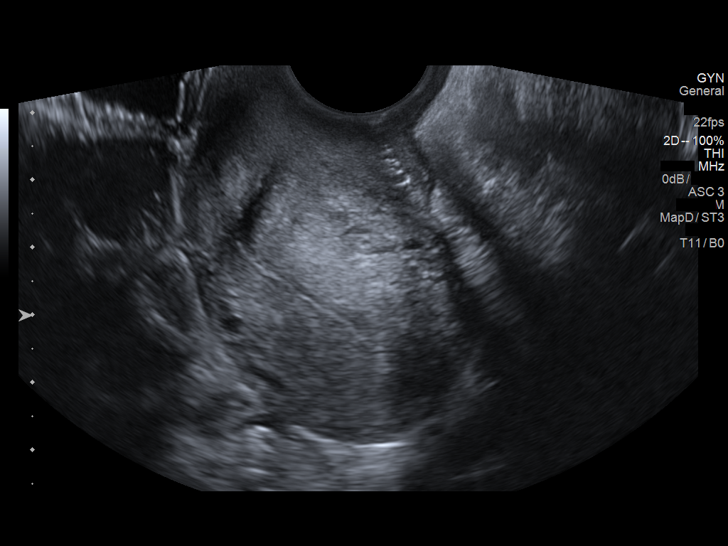
[im 21/62]
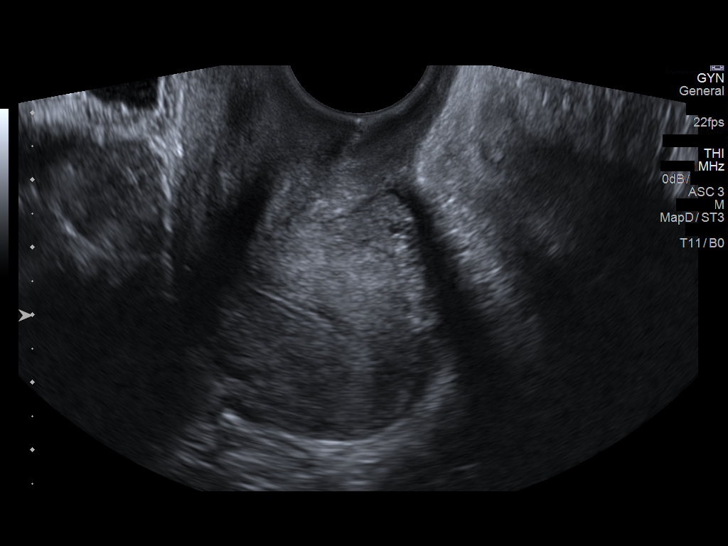
[im 23/62]
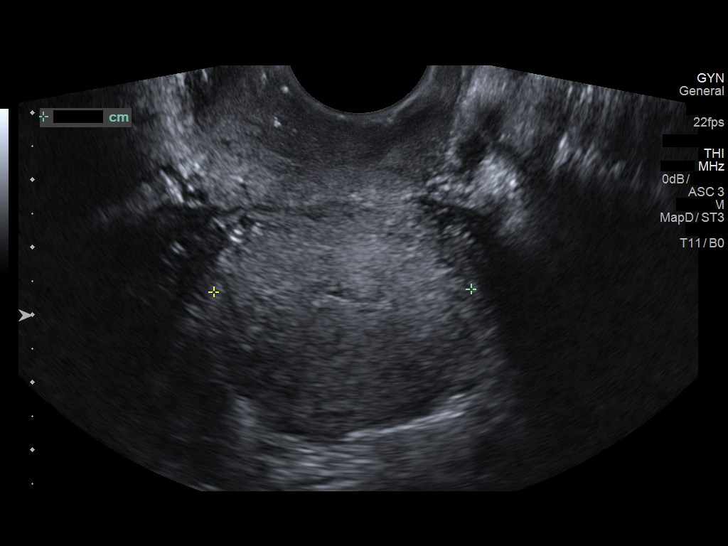
[im 28/62]
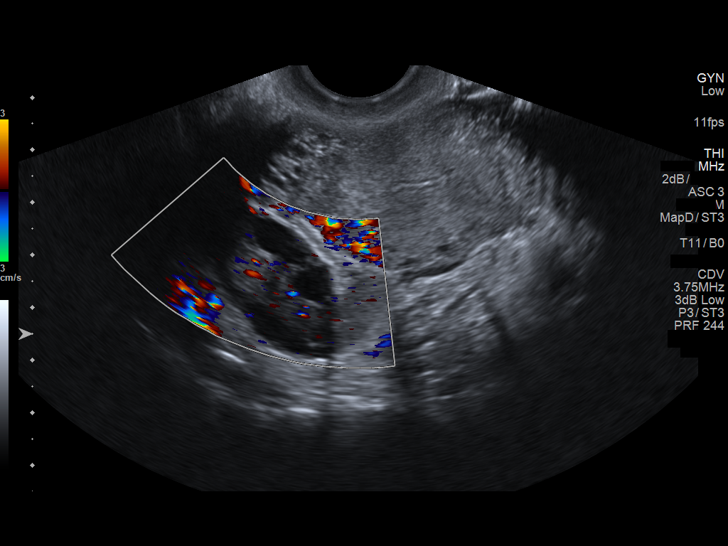
[im 34/62]
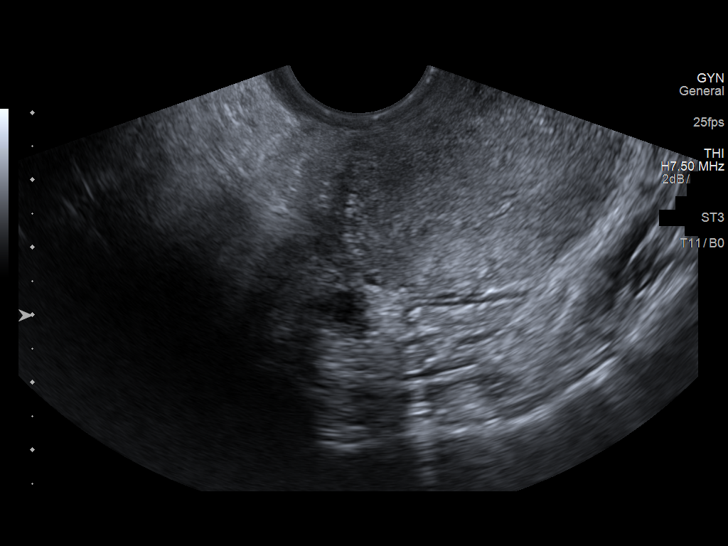
[im 39/62]
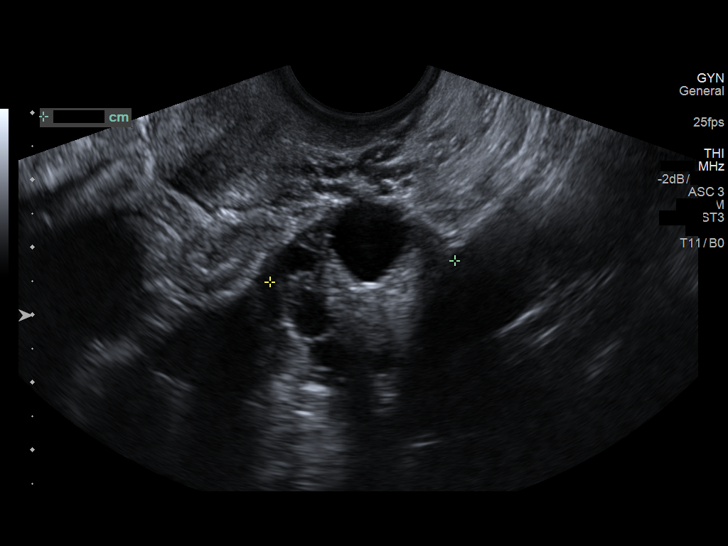
[im 41/62]
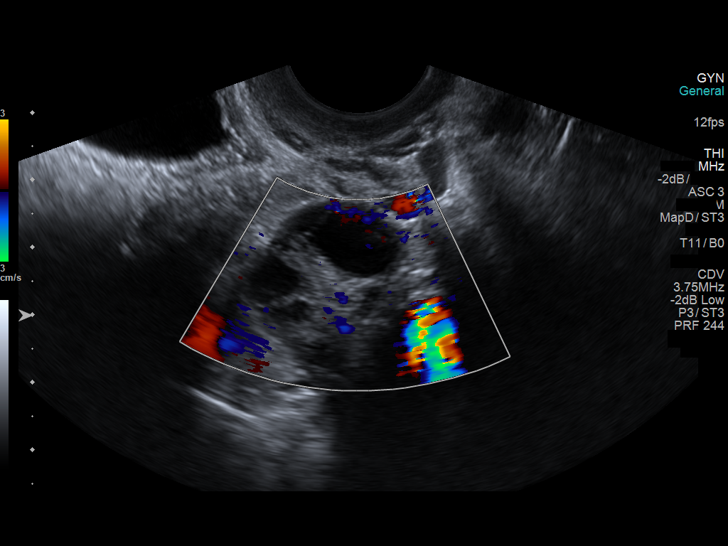
[im 46/62]
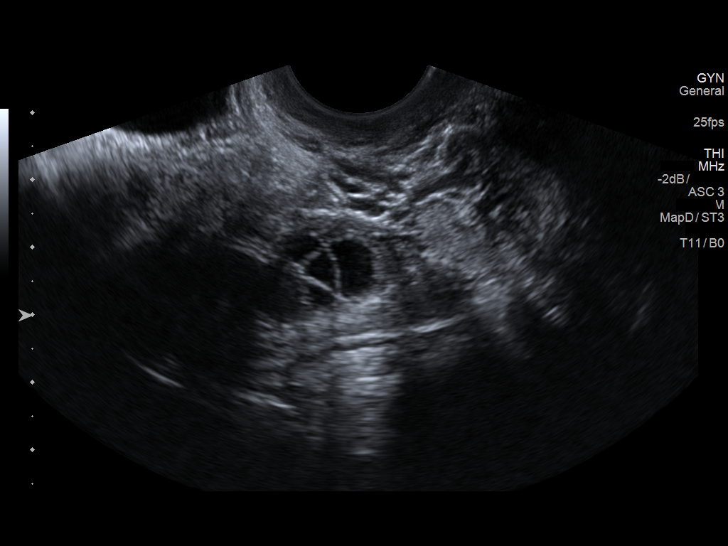
[im 51/62]
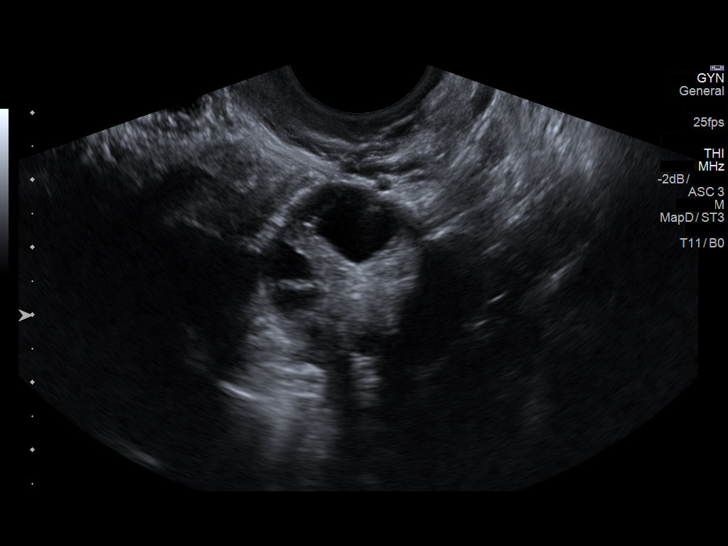
[im 56/62]
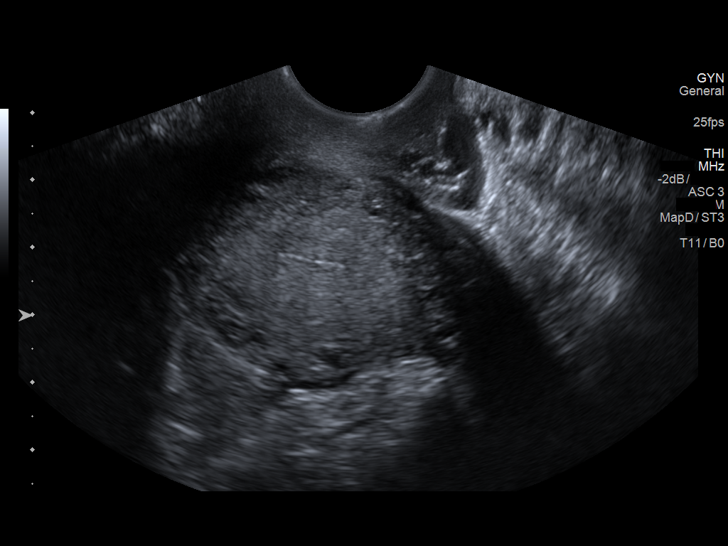
[im 62/62]
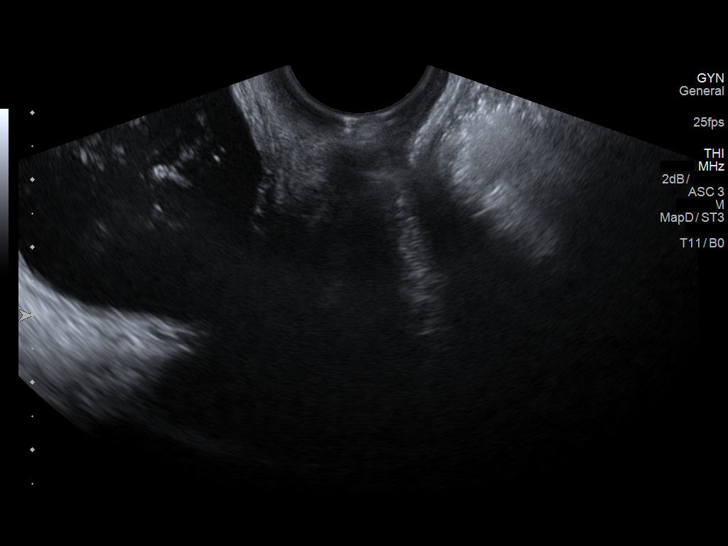

[14 of 25 positions shown; findings below may reference images not displayed]

FINDINGS: Uterus

Measurements: 5.8 x 3.6 x 3.8 cm. No fibroids or other mass
visualized.

Endometrium

Thickness: 0.3 cm.  No focal abnormality visualized.

Right ovary

Measurements: 3.6 x 1.8 x 2.6 cm. Normal appearance/no adnexal mass.

Left ovary

Measurements: 3.5 x 2.7 x 2.8 cm. Normal appearance/no adnexal mass.

Other findings

No abnormal free fluid.

Debris is noted within the bladder.
IMPRESSION: 1. Unremarkable pelvic ultrasound.  No evidence for ovarian torsion.
2. Debris noted within the bladder.

## 2019-06-15 IMAGING — DX CHEST - 2 VIEW
2 series · 2 of 2 positions shown · non-contrast
Comparison: 07/05/2016

CLINICAL DATA: Chest pain and shortness of breath.

EXAM:
CHEST - 2 VIEW

[w chest pa]
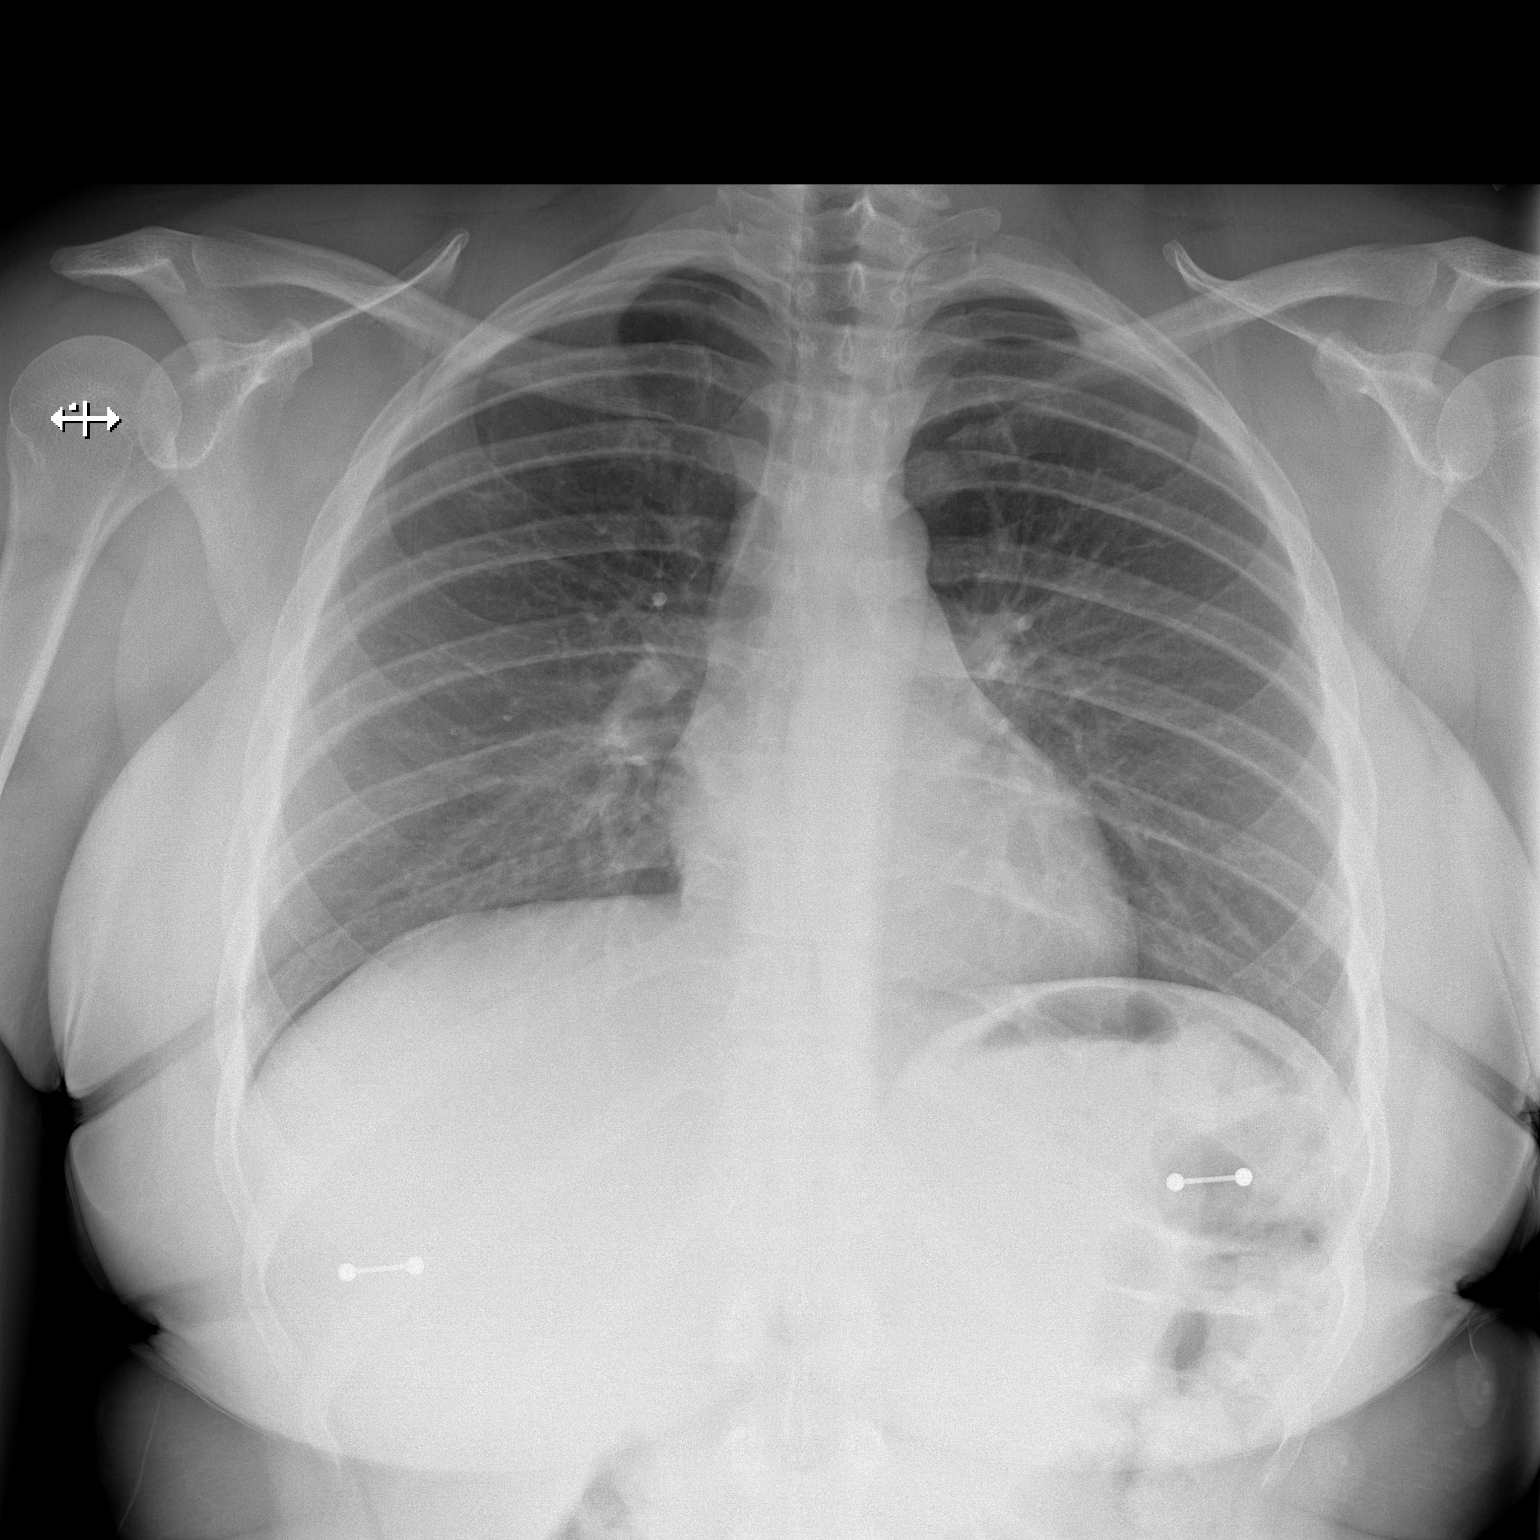

[w chest lat]
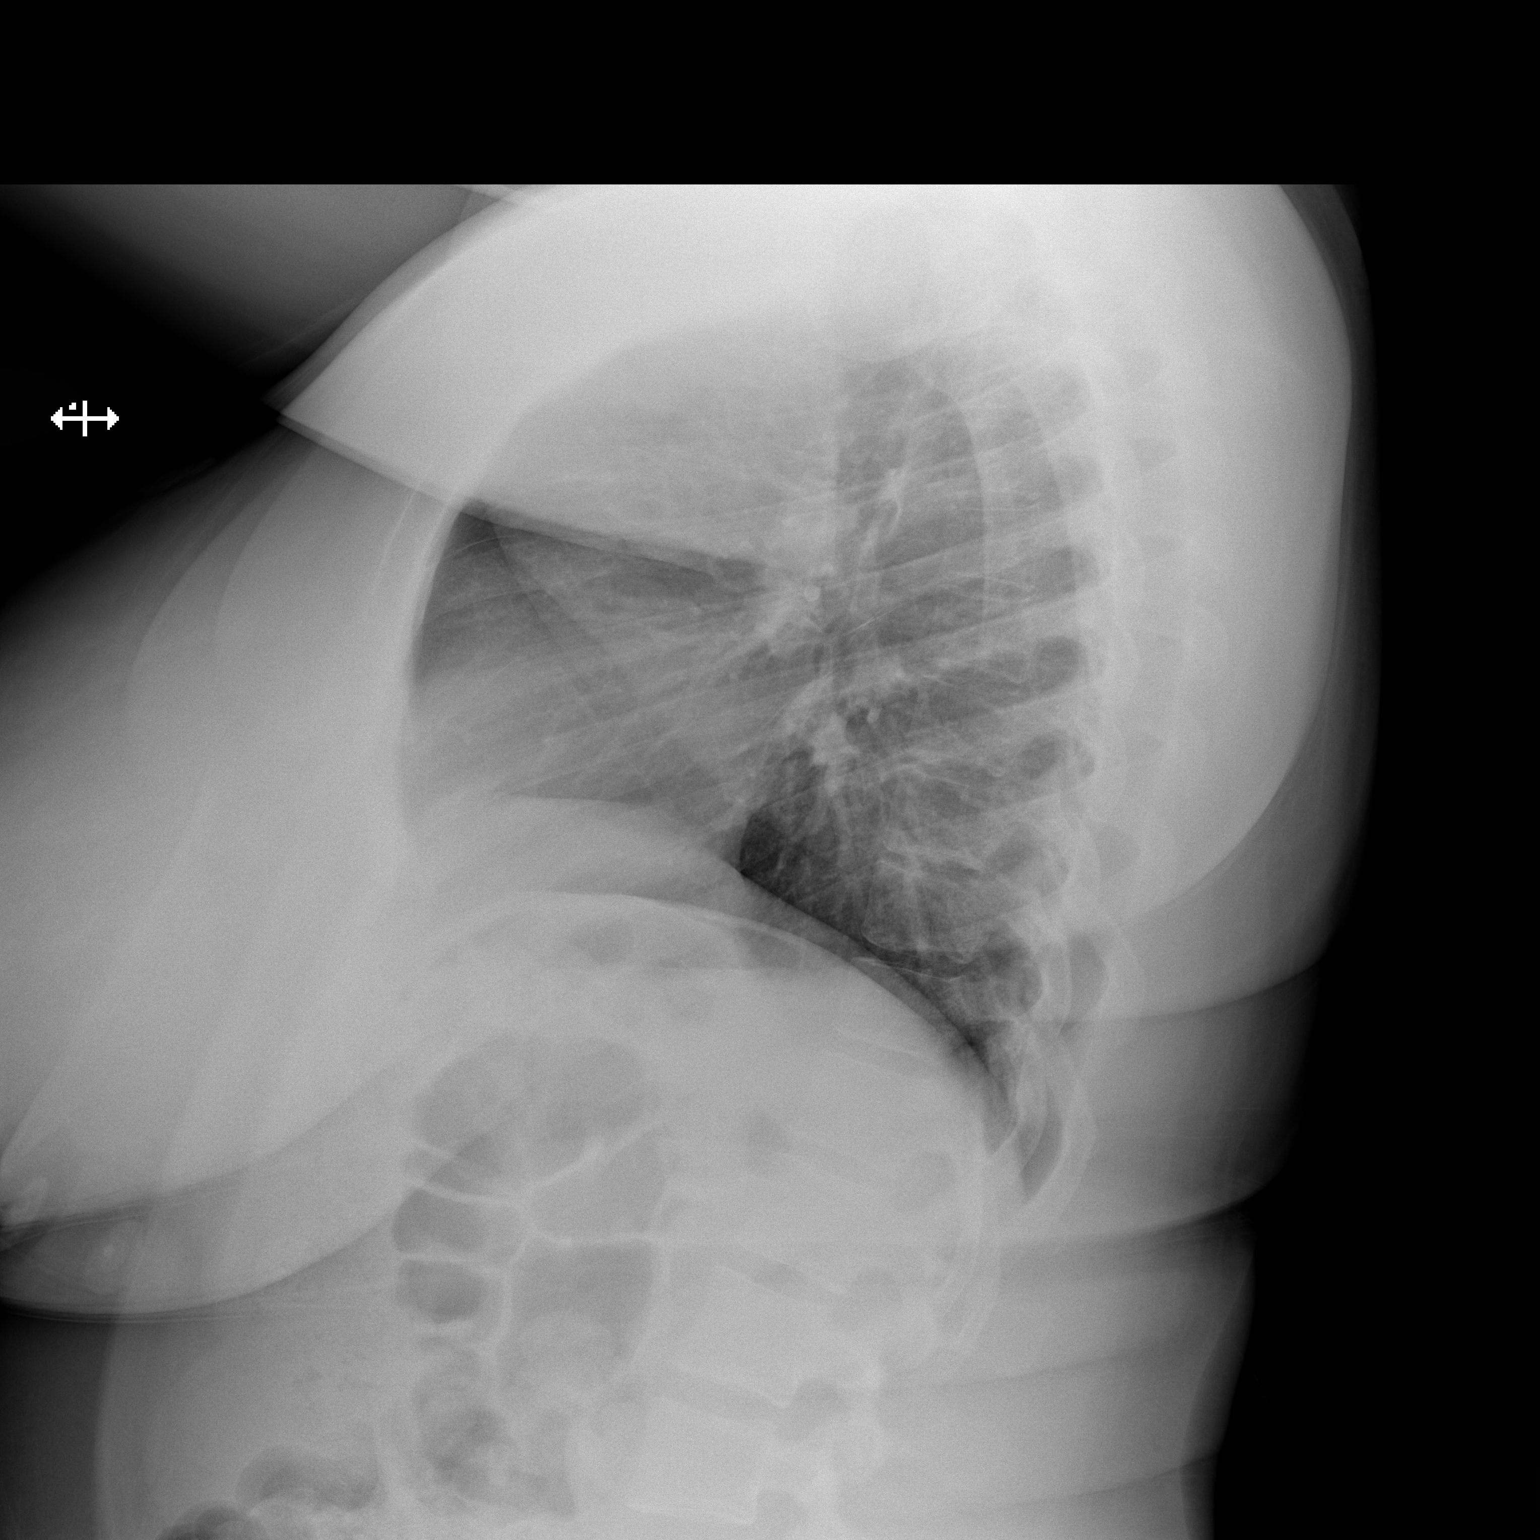

[2 of 2 positions shown; findings below may reference images not displayed]

FINDINGS: The heart size and mediastinal contours are within normal limits.
Both lungs are clear. The visualized skeletal structures are
unremarkable.
IMPRESSION: Negative.  No active cardiopulmonary disease.

## 2019-06-16 IMAGING — CT CT ANGIOGRAPHY CHEST
2 of 7 series · 18 of 46 positions shown · IV contrast (APPLIED)
Comparison: Chest radiographs 10/23/2018 and chest CTA 08/20/2015.

CLINICAL DATA: 27-year-old female with shortness of breath cough
and fever for 2 days.

EXAM:
CT ANGIOGRAPHY CHEST WITH CONTRAST
TECHNIQUE: Multidetector CT imaging of the chest was performed using the
standard protocol during bolus administration of intravenous
contrast. Multiplanar CT image reconstructions and MIPs were
obtained to evaluate the vascular anatomy.
CONTRAST:  100mL OMNIPAQUE IOHEXOL 350 MG/ML SOLN

[Series 7: thins · axial · 0.65mm/px · z∈[+1105,+1315]mm · 15 of 337 slices shown]
[im 19/337  lung]
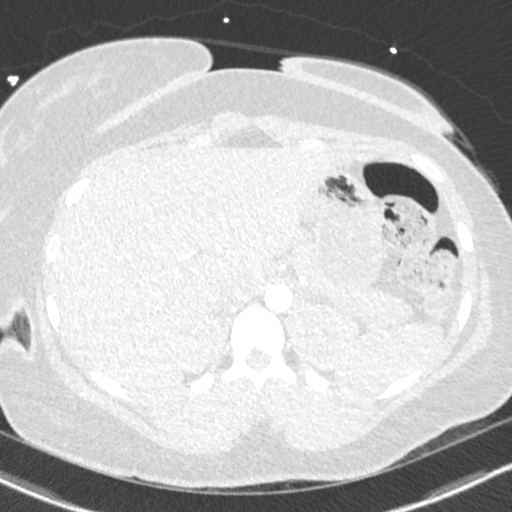
[im 38/337  soft-tissue]
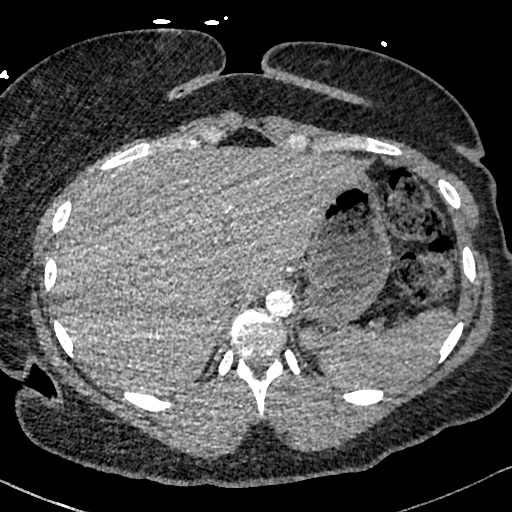
[im 57/337  lung]
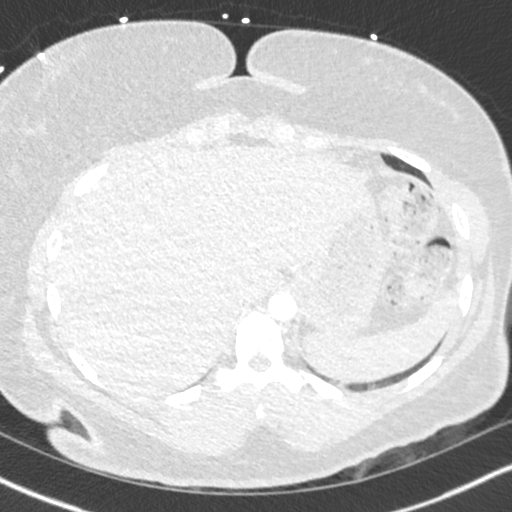
[im 75/337  soft-tissue]
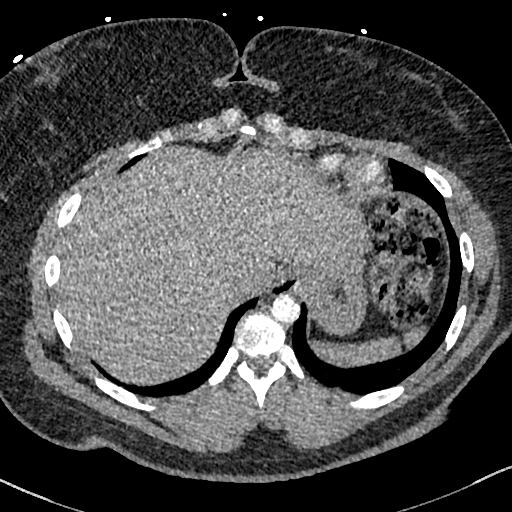
[im 113/337  lung]
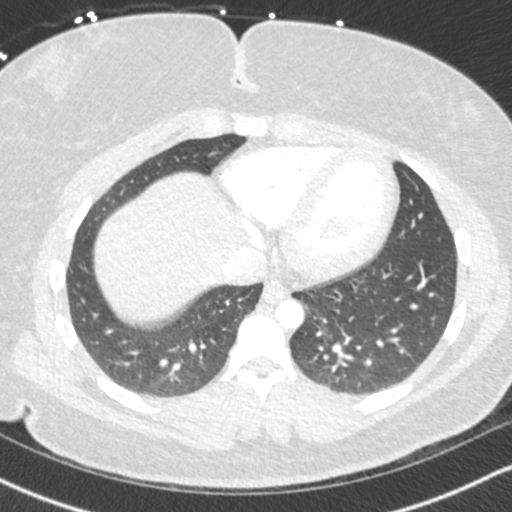
[im 131/337  soft-tissue]
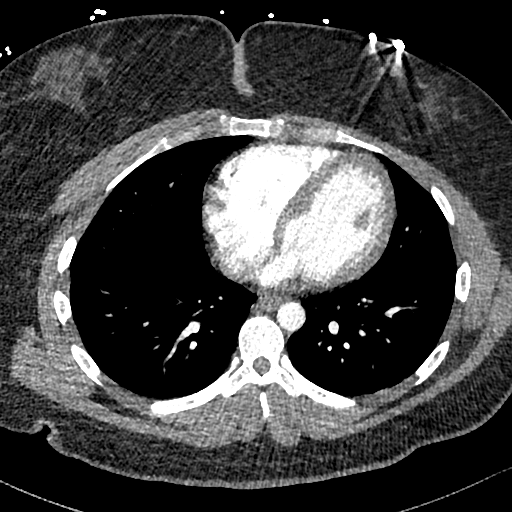
[im 150/337  lung]
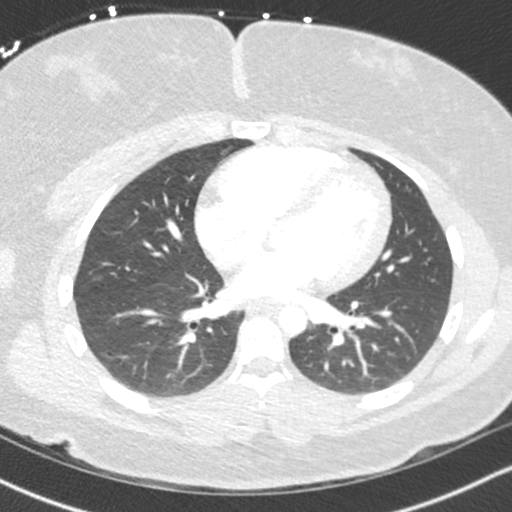
[im 169/337  soft-tissue]
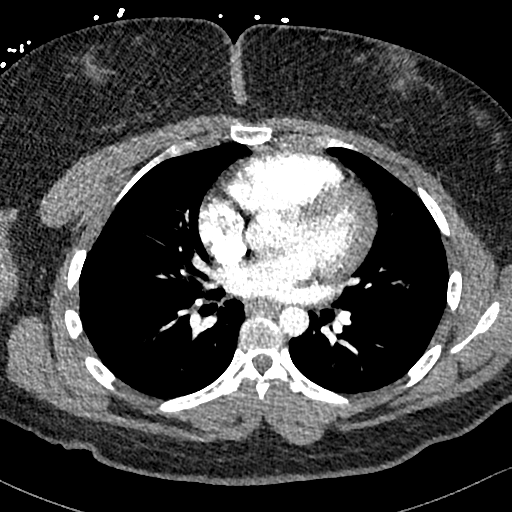
[im 187/337  lung]
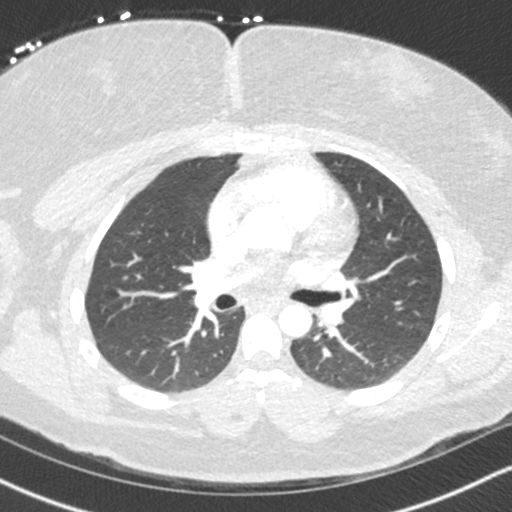
[im 206/337  soft-tissue]
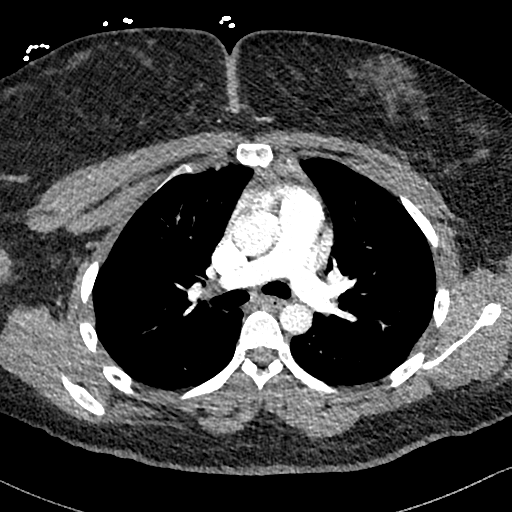
[im 225/337  lung]
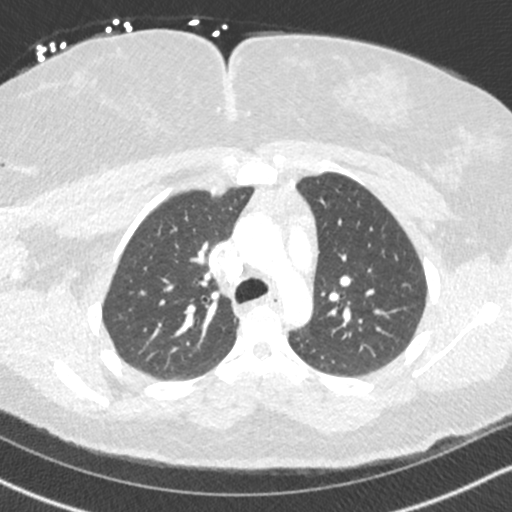
[im 262/337  soft-tissue]
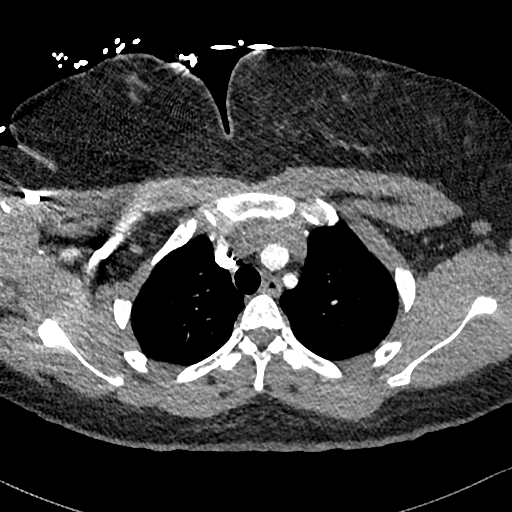
[im 281/337  lung]
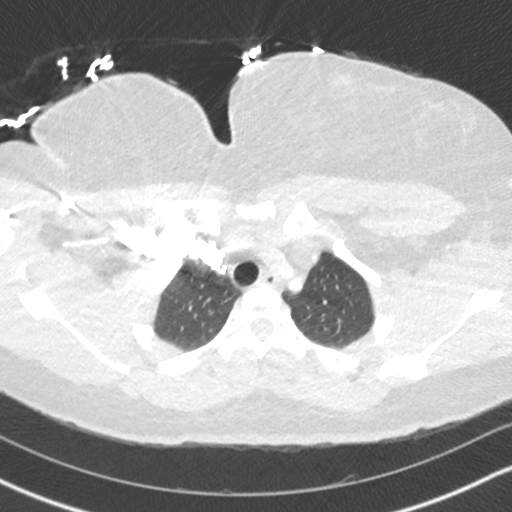
[im 299/337  soft-tissue]
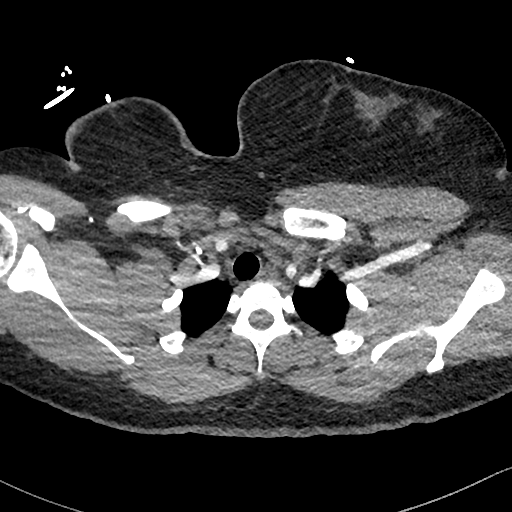
[im 318/337  lung]
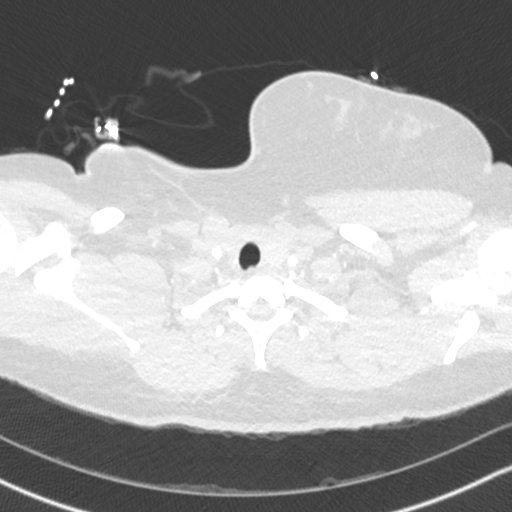

[Series 8: cor · coronal · 0.52mm/px · 3 of 151 slices shown]
[im 38/151  soft-tissue]
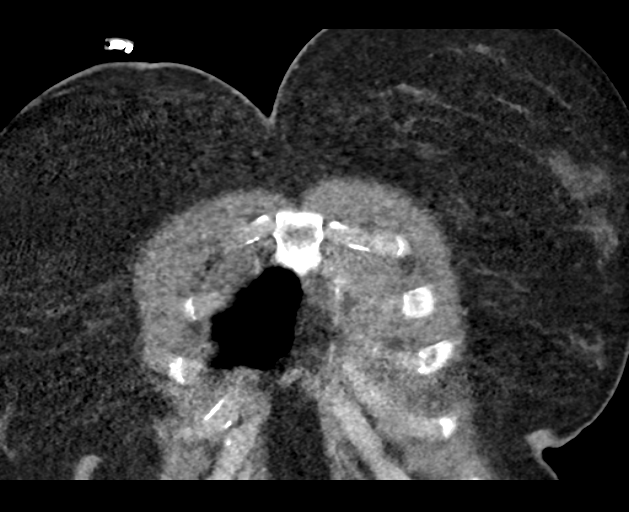
[im 76/151  soft-tissue]
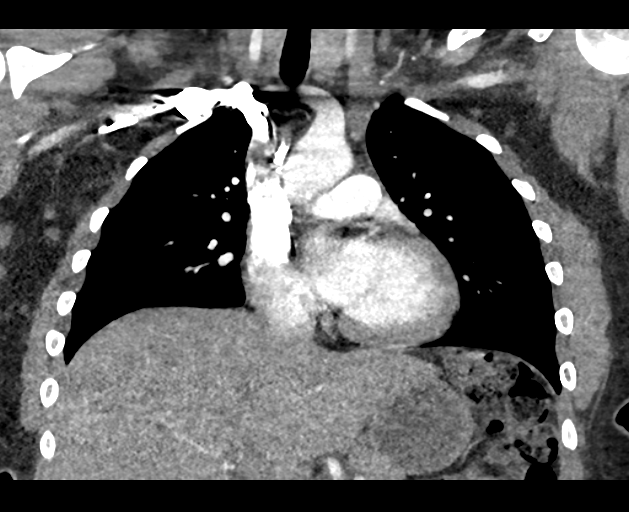
[im 113/151  soft-tissue]
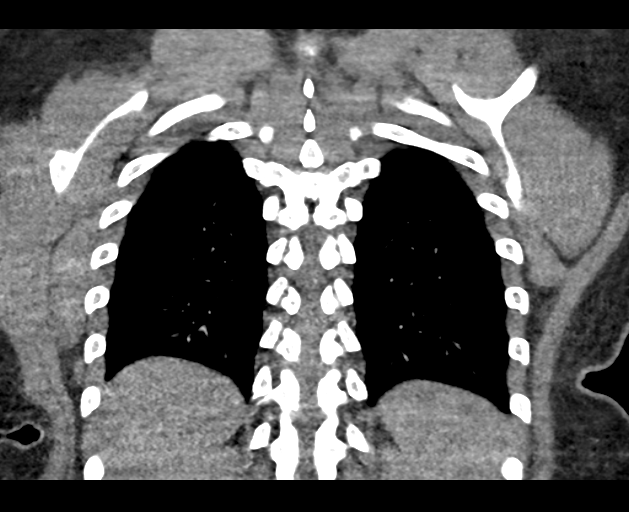

[18 of 46 positions shown; findings below may reference images not displayed]

FINDINGS: Cardiovascular: Good contrast bolus timing in the pulmonary arterial
tree.

No focal filling defect identified in the pulmonary arteries to
suggest acute pulmonary embolism.

Negative visible aorta. Cardiac size at the upper limits of normal.
No pericardial effusion.

Mediastinum/Nodes: Small volume residual thymus. No mediastinal
lymphadenopathy.

Lungs/Pleura: Major airways are patent. Minor dependent atelectasis.
A stable small 3 millimeter right middle lobe lung nodule on series
6, image 64 is postinflammatory.

Otherwise both lungs are clear. No pleural effusion.

Upper Abdomen: Negative visible liver, spleen, pancreas, adrenal
glands, kidneys and bowel in the upper abdomen.

Musculoskeletal: Negative.

Review of the MIP images confirms the above findings.
IMPRESSION: Negative for acute pulmonary embolus. No acute findings in the
chest.

## 2019-06-29 NOTE — L&D Delivery Note (Signed)
DELIVERY NOTE    Patient Name: Angela [name], Angela [name]  [id number]  Gestational age: [redacted]w[redacted]d    Procedure:   Spontaneous Vaginal Delivery  Patient presented to labor and delivery in active labor.   NCB    Anesthesia:   NCB    Brief Notes:   Rupture:    04/17/2020 @2100    Fluid color:   clear    Birth Date: 04/18/2019   Delivery Time: 1:15 AM      Patient progressed rapidly on own to 10cm and started grunting/bearing down.   Normal spontaneous vaginal birth of viable female  infant "Sunday". Head delivered spontaneously. No nuchal cord present. Shoulders and body birthed without difficulty. Baby to maternal abdomen; strong spontaneous cry . Cord clamped x2 and cut after 1 minutes by FOB. Baby skin-to-skin with mother.  Placenta delivered spontaneously. Inspected, complete and intact with cord with normal cord insertion. FFU/U with massage.  Pitocin 30 units in 500 cc infusing for good uterine response.  Perineum inspected and 2nd degree laceration noted. Repaired under local with 3-0 Vicryl in the usual fashion. Mom and baby stable and bonding.  Count complete and correct.    APGAR:   1 Minute Apgar:     9   5 Minute Apgar:     9       Birth Weight:     7 lb 7.2 oz (3380 g)     Placenta Delivery Date:      04/18/2020  Placenta Delivery Time:      01 23      Estimated Blood Loss:    400    Additional Notes:   Patient was GBS Negative    Available physician: Maryjean Ka, MD    Providers Performing:   Satira Sark, CNM, CNM    Signed by: Satira Sark, CNM, CNM

## 2019-08-24 ENCOUNTER — Inpatient Hospital Stay (HOSPITAL_COMMUNITY)
Admission: EM | Admit: 2019-08-24 | Discharge: 2019-08-24 | Disposition: A | Discharge status: Home / Self Care | Payer: Medicaid Other | Attending: Obstetrics and Gynecology | Admitting: Obstetrics and Gynecology

## 2019-08-24 ENCOUNTER — Encounter (HOSPITAL_COMMUNITY): Payer: Self-pay | Admitting: Obstetrics and Gynecology

## 2019-08-24 ENCOUNTER — Other Ambulatory Visit: Payer: Self-pay

## 2019-08-24 ENCOUNTER — Inpatient Hospital Stay (HOSPITAL_COMMUNITY): Payer: Medicaid Other

## 2019-08-24 DIAGNOSIS — O26899 Other specified pregnancy related conditions, unspecified trimester: Secondary | ICD-10-CM

## 2019-08-24 DIAGNOSIS — R109 Unspecified abdominal pain: Secondary | ICD-10-CM | POA: Diagnosis present

## 2019-08-24 DIAGNOSIS — Z87891 Personal history of nicotine dependence: Secondary | ICD-10-CM | POA: Diagnosis not present

## 2019-08-24 DIAGNOSIS — Z3A01 Less than 8 weeks gestation of pregnancy: Secondary | ICD-10-CM | POA: Insufficient documentation

## 2019-08-24 DIAGNOSIS — O26892 Other specified pregnancy related conditions, second trimester: Secondary | ICD-10-CM

## 2019-08-24 DIAGNOSIS — Z3A26 26 weeks gestation of pregnancy: Secondary | ICD-10-CM

## 2019-08-24 DIAGNOSIS — O26891 Other specified pregnancy related conditions, first trimester: Secondary | ICD-10-CM | POA: Diagnosis not present

## 2019-08-24 DIAGNOSIS — O3680X0 Pregnancy with inconclusive fetal viability, not applicable or unspecified: Secondary | ICD-10-CM

## 2019-08-24 LAB — HCG, QUANTITATIVE, PREGNANCY: hCG, Beta Chain, Quant, S: 7244 m[IU]/mL — ABNORMAL HIGH (ref <5)

## 2019-08-24 LAB — WET PREP, GENITAL
Clue Cells Wet Prep HPF POC: NONE SEEN
Sperm: NONE SEEN
Trich, Wet Prep: NONE SEEN
Yeast Wet Prep HPF POC: NONE SEEN

## 2019-08-24 LAB — CBC
HCT: 40.6 % (ref 36.0–46.0)
Hemoglobin: 13.6 g/dL (ref 12.0–15.0)
MCH: 32.3 pg (ref 26.0–34.0)
MCHC: 33.5 g/dL (ref 30.0–36.0)
MCV: 96.4 fL (ref 80.0–100.0)
Platelets: 321 10*3/uL (ref 150–400)
RBC: 4.21 MIL/uL (ref 3.87–5.11)
RDW: 12.5 % (ref 11.5–15.5)
WBC: 6.4 10*3/uL (ref 4.0–10.5)
nRBC: 0 % (ref 0.0–0.2)

## 2019-08-24 LAB — ABO/RH: ABO/RH(D): A POS

## 2019-08-24 MED ORDER — ACETAMINOPHEN 500 MG PO TABS
1000.0000 mg | ORAL_TABLET | Freq: Once | ORAL | Status: AC
  Administered 2019-08-24: 13:00:00 1000 mg via ORAL
  Filled 2019-08-24: qty 2

## 2019-08-24 NOTE — MAU Provider Note (Signed)
History     CSN: [id number]  Arrival date and time: 08/24/19 1116   First Provider Initiated Contact with Patient 08/24/19 1231      Chief Complaint  Patient presents with  . Abdominal Pain   HPI Jessica Knapp is a 29 y.o. G4P0030 in early pregnancy who presents to MAU with chief complaint of abdominal cramping. This is a new problem, onset yesterday and worsening over time. Patient rates her score as 5/10 and her pain radiates to her lower back. She experienced a small amount of relief when she took Tylenol last night. She denies aggravating factors. She denies vaginal bleeding, dysuria, fever or recent illness. She is remote from sexual intercourse.  OB History    Gravida  4   Para      Term      Preterm      AB  3   Living        SAB  3   TAB      Ectopic      Multiple      Live Births              Past Medical History:  Diagnosis Date  . Asthma   . Chronic headaches   . GERD (gastroesophageal reflux disease)   . Recurrent upper respiratory infection (URI)   . Urticaria     Past Surgical History:  Procedure Laterality Date  . FINGER SURGERY    . HAND SURGERY    . TONSILLECTOMY      Family History  Problem Relation Age of Onset  . Asthma Maternal Grandfather   . Asthma Brother   . Heart disease Maternal Grandmother     Social History   Tobacco Use  . Smoking status: Former Smoker    Packs/day: 0.03    Years: 1.00    Pack years: 0.03    Types: Cigarettes  . Smokeless tobacco: Never Used  Substance Use Topics  . Alcohol use: No    Alcohol/week: 0.0 standard drinks  . Drug use: No    Allergies:  Allergies  Allergen Reactions  . Apple Hives  . Banana Hives  . Peanut-Containing Drug Products Hives  . Pear Hives  . Plum Pulp Hives    Medications Prior to Admission  Medication Sig Dispense Refill Last Dose  . Prenatal Vit-Fe Fumarate-FA (MULTIVITAMIN-PRENATAL) 27-0.8 MG TABS tablet Take 1 tablet by mouth daily at 12 noon.    08/24/2019 at Unknown time  . albuterol (PROVENTIL HFA;VENTOLIN HFA) 108 (90 Base) MCG/ACT inhaler Inhale 2 puffs into the lungs every 6 (six) hours as needed for wheezing or shortness of breath. 1 Inhaler 2   . APRI 0.15-30 MG-MCG tablet TAKE 1 TABLET BY MOUTH EVERY DAY 84 tablet 3   . Azelastine HCl 0.15 % SOLN Place 2 sprays into both nostrils 2 (two) times daily. 30 mL 5   . benzonatate (TESSALON) 100 MG capsule Take 1 capsule (100 mg total) by mouth 2 (two) times daily as needed for cough. 20 capsule 0   . cyclobenzaprine (FLEXERIL) 5 MG tablet Take 1-2 tablets at bedtime for muscle spasm. 30 tablet 0   . eletriptan (RELPAX) 40 MG tablet Take 1 tablet earliest onset of migraine.  May repeat once in 2 hours if headache persists or recurs. 10 tablet 2   . famotidine (PEPCID) 20 MG tablet Take 1 tablet (20 mg total) by mouth at bedtime. (Patient taking differently: Take 20 mg by mouth daily as needed  for heartburn. ) 30 tablet 5   . levocetirizine (XYZAL) 5 MG tablet Take 1 tablet (5 mg total) by mouth every evening. 30 tablet 5   . montelukast (SINGULAIR) 10 MG tablet TAKE 1 TABLET BY MOUTH EVERYDAY AT BEDTIME 90 tablet 1   . norgestimate-ethinyl estradiol (SPRINTEC 28) 0.25-35 MG-MCG tablet Take 1 tablet by mouth daily. 84 tablet 3   . nortriptyline (PAMELOR) 25 MG capsule TAKE 1 CAPSULE (25 MG TOTAL) BY MOUTH AT BEDTIME. 90 capsule 2   . Olopatadine HCl (PATADAY) 0.2 % SOLN Place 1 drop into both eyes 1 day or 1 dose. 1 Bottle 5     Review of Systems  Constitutional: Negative for chills, fatigue and fever.  Respiratory: Negative for shortness of breath.   Gastrointestinal: Positive for abdominal pain.  Genitourinary: Negative for dysuria, vaginal bleeding, vaginal discharge and vaginal pain.  Musculoskeletal: Positive for back pain.  All other systems reviewed and are negative.  Physical Exam   Blood pressure (!) 128/59, pulse (!) 105, temperature 98.6 F (37 C), temperature source  Oral, resp. rate 16, height 5\' 4"  (1.626 m), weight 113.4 kg, last menstrual period 07/15/2019, SpO2 99 %.  Physical Exam  Nursing note and vitals reviewed. Constitutional: She is oriented to person, place, and time. She appears well-developed and well-nourished.  Cardiovascular: Normal rate.  Respiratory: Effort normal and breath sounds normal. No respiratory distress.  GI: Soft. Bowel sounds are normal. She exhibits no distension. There is no abdominal tenderness. There is no rebound and no guarding.  Genitourinary:    Uterus normal.     Vaginal discharge present.     Genitourinary Comments: No visible external injury. Vagina is pink and well rugated. Scant thin white discharge. No adnexal tenderness or CMT. No vaginal bleeding noted.   Neurological: She is alert and oriented to person, place, and time.  Skin: Skin is warm and dry.  Psychiatric: She has a normal mood and affect. Her behavior is normal. Judgment and thought content normal.    MAU Course  Procedures   --Slight improvement in pain score following Tylenol  Patient Vitals for the past 24 hrs:  BP Temp Temp src Pulse Resp SpO2 Height Weight  08/24/19 1434 111/76 - - - - - - -  08/24/19 1146 (!) 128/59 - - (!) 105 - 99 % 5\' 4"  (1.626 m) 113.4 kg  08/24/19 1122 134/90 98.6 F (37 C) Oral 94 16 100 % 5\' 4"  (1.626 m) 95.3 kg   Results for orders placed or performed during the hospital encounter of 08/24/19 (from the past 24 hour(s))  CBC     Status: None   Collection Time: 08/24/19 12:18 PM  Result Value Ref Range   WBC 6.4 4.0 - 10.5 K/uL   RBC 4.21 3.87 - 5.11 MIL/uL   Hemoglobin 13.6 12.0 - 15.0 g/dL   HCT 08/26/19 - 08/26/19 %   MCV 96.4 80.0 - 100.0 fL   MCH 32.3 26.0 - 34.0 pg   MCHC 33.5 30.0 - 36.0 g/dL   RDW 12.8 78.6 - 76.7 %   Platelets 321 150 - 400 K/uL   nRBC 0.0 0.0 - 0.2 %  hCG, quantitative, pregnancy     Status: Abnormal   Collection Time: 08/24/19 12:18 PM  Result Value Ref Range   hCG, Beta  Chain, Quant, S 7,244 (H) <5 mIU/mL  ABO/Rh     Status: None   Collection Time: 08/24/19 12:18 PM  Result Value Ref Range  ABO/RH(D) A POS    No rh immune globuloin      NOT A RH IMMUNE GLOBULIN CANDIDATE, PT RH POSITIVE Performed at [facility] [address] [place], [place] [address]   Wet prep, genital     Status: Abnormal   Collection Time: 08/24/19  1:50 PM  Result Value Ref Range   Yeast Wet Prep HPF POC NONE SEEN NONE SEEN   Trich, Wet Prep NONE SEEN NONE SEEN   Clue Cells Wet Prep HPF POC NONE SEEN NONE SEEN   WBC, Wet Prep HPF POC MANY (A) NONE SEEN   Sperm NONE SEEN    US OB LESS THAN 14 WEEKS WITH OB TRANSVAGINAL  Result Date: 08/24/2019 CLINICAL DATA:  Abdominal cramping obstructing pregnancy. EXAM: OBSTETRIC <14 WK [place] AND TRANSVAGINAL OB US TECHNIQUE: Both transabdominal and transvaginal ultrasound examinations were performed for complete evaluation of the gestation as well as the maternal uterus, adnexal regions, and pelvic cul-de-sac. Transvaginal technique was performed to assess early pregnancy. COMPARISON:  Nov 19, 2016. FINDINGS: Intrauterine gestational sac: Single Yolk sac:  Not Visualized. Embryo:  Not Visualized. Cardiac Activity: Not Visualized. MSD: 9.24 mm   5 w   5 d Subchorionic hemorrhage:  None visualized. Maternal uterus/adnexae: Ovaries are unremarkable. No free fluid is noted. IMPRESSION: Probable early intrauterine gestational sac, but no yolk sac, fetal pole, or cardiac activity yet visualized. Recommend follow-up quantitative B-HCG levels and follow-up US in 14 days to assess viability. This recommendation follows SRU consensus guidelines: Diagnostic Criteria for Nonviable Pregnancy Early in the First Trimester. [place] Cor[place] Med 2013; 062:3762-83. Electronically Signed   By: Marijo Conception M.D.   On: 08/24/2019 13:18   Assessment and Plan  --29 y.o. G4P0030 with probable IUGS --Pregnancy of unknown location --Discharge home in stable condition with  strict ectopic precautions  F/U: --Patient to return to MAU in 48 hours for repeat quant hCG (08/26/2019)  Darlina Rumpf, CNM 08/24/2019, 3:51 PM

## 2019-08-24 NOTE — Discharge Instructions (Signed)
Follow these instructions at home: After being treated with medicine or surgery:  Rest and limit your activity for as long as told by your doctor.  Until your doctor says that it is safe: ? Do not lift anything that is heavier than 10 lb (4.5 kg) or the limit that your doctor tells you. ? Avoid exercise and any movement that takes a lot of effort.  To prevent problems when pooping (constipation): ? Eat a healthy diet. This includes:  Fruits.  Vegetables.  Whole grains. ? Drink 6-8 glasses of water a day. Contact a doctor if: Get help right away if:  You have sudden and very bad pain in your belly.  You have very bad pain in your shoulders or neck.  You have pain that gets worse and is not helped by medicine.  You have: ? A fever or chills. ? Vaginal bleeding. ? Redness or swelling at the site of a surgical cut (incision).  You feel sick to your stomach (nauseous) or you throw up (vomit).  You feel dizzy or weak.  You feel light-headed or you pass out (faint). Summary  An ectopic pregnancy happens when a fertilized egg grows outside the womb (uterus).  If this problem is found early, you may be treated with medicine that stops the egg from growing.  If your tube tears or bursts open (ruptures), you will need surgery. This is an emergency. Get help right away. This information is not intended to replace advice given to you by your health care provider. Make sure you discuss any questions you have with your health care provider. Document Revised: 05/27/2017 Document Reviewed: 07/08/2016 Elsevier Patient Education  2020 [employer].

## 2019-08-24 NOTE — MAU Note (Signed)
"  Having really bad cramps, started a few days ago, were coming and going, now are consistent". No bleeding. preg confirmed at [facility] on Tues.

## 2019-08-24 NOTE — ED Provider Notes (Signed)
MSE was initiated and I personally evaluated the patient and placed orders (if any) at  11:28 AM on August 24, 2019.  Jessica Knapp is a 29 y.o. female who is currently [redacted] weeks pregnant, she has been experiencing some lower abdominal cramping and pain over the past 3 days worsening today.  No associated bleeding or vaginal discharge.  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  Vitals are stable and patient is well-appearing.  She will be transferred over to the MAU for evaluation of these complaints.  Case discussed with MAU provider Sam, nursing staff will transfer patient to MAT.  The patient appears stable so that the remainder of the MSE may be completed by another provider.   Dartha Lodge, PA-C 08/24/19 1130    Jacalyn Lefevre, MD 08/30/19 1200

## 2019-08-26 ENCOUNTER — Inpatient Hospital Stay (HOSPITAL_COMMUNITY)
Admission: AD | Admit: 2019-08-26 | Discharge: 2019-08-26 | Disposition: A | Discharge status: Home / Self Care | Payer: Medicaid Other | Attending: Obstetrics & Gynecology | Admitting: Obstetrics & Gynecology

## 2019-08-26 ENCOUNTER — Other Ambulatory Visit: Payer: Self-pay

## 2019-08-26 DIAGNOSIS — O3680X0 Pregnancy with inconclusive fetal viability, not applicable or unspecified: Secondary | ICD-10-CM

## 2019-08-26 DIAGNOSIS — Z3A01 Less than 8 weeks gestation of pregnancy: Secondary | ICD-10-CM | POA: Diagnosis not present

## 2019-08-26 DIAGNOSIS — O26891 Other specified pregnancy related conditions, first trimester: Secondary | ICD-10-CM | POA: Insufficient documentation

## 2019-08-26 DIAGNOSIS — R109 Unspecified abdominal pain: Secondary | ICD-10-CM

## 2019-08-26 LAB — HCG, QUANTITATIVE, PREGNANCY: hCG, Beta Chain, Quant, S: 11889 m[IU]/mL — ABNORMAL HIGH (ref <5)

## 2019-08-26 NOTE — MAU Provider Note (Signed)
History    First Provider Initiated Contact with Patient 08/26/19 1319     Chief Complaint:  Follow-up   Jessica Knapp is  29 y.o. G4P0030 Patient's last menstrual period was 07/15/2019.Marland Kitchen Patient is here for follow up of quantitative HCG and ongoing surveillance of pregnancy status.   She is [redacted]w[redacted]d weeks gestation  by LMP.    Since her last visit, the patient is without new complaint.     ROS Abdominal Pain: mild cramping Vaginal bleeding: none now.   Passage of clots or tissue: Denies Dizziness: Denies  A POS  Her previous Quantitative HCG values are:  Results for Jessica, Knapp (MRN [id number]) as of 08/26/2019 12:47  Ref. Range 08/24/2019 12:18  HCG, Beta Chain, Quant, S Latest Ref Range: <5 mIU/mL 7,244 (H)   Physical Exam   Patient Vitals for the past 24 hrs:  BP Temp Temp src Pulse Resp SpO2  08/26/19 1157 132/78 98.7 F (37.1 C) Oral 94 16 99 %   Constitutional: Well-nourished female in no apparent distress. No pallor Neuro: Alert and oriented 4 Cardiovascular: Normal rate Respiratory: Normal effort and rate Abdomen: Soft, nontender Gynecological Exam: examination not indicated  Labs: Results for orders placed or performed during the hospital encounter of 08/26/19 (from the past 24 hour(s))  hCG, quantitative, pregnancy   Collection Time: 08/26/19 11:53 AM  Result Value Ref Range   hCG, Beta Chain, Quant, S 11,889 (H) <5 mIU/mL   Ultrasound Studies:   US OB LESS THAN 14 WEEKS WITH OB TRANSVAGINAL  Result Date: 08/24/2019 CLINICAL DATA:  Abdominal cramping obstructing pregnancy. EXAM: OBSTETRIC <14 WK [place] AND TRANSVAGINAL OB US TECHNIQUE: Both transabdominal and transvaginal ultrasound examinations were performed for complete evaluation of the gestation as well as the maternal uterus, adnexal regions, and pelvic cul-de-sac. Transvaginal technique was performed to assess early pregnancy. COMPARISON:  Nov 19, 2016. FINDINGS: Intrauterine gestational sac: Single Yolk  sac:  Not Visualized. Embryo:  Not Visualized. Cardiac Activity: Not Visualized. MSD: 9.24 mm   5 w   5 d Subchorionic hemorrhage:  None visualized. Maternal uterus/adnexae: Ovaries are unremarkable. No free fluid is noted. IMPRESSION: Probable early intrauterine gestational sac, but no yolk sac, fetal pole, or cardiac activity yet visualized. Recommend follow-up quantitative B-HCG levels and follow-up US in 14 days to assess viability. This recommendation follows SRU consensus guidelines: Diagnostic Criteria for Nonviable Pregnancy Early in the First Trimester. [employer] Corning Med 2013; 756:4332-95. Electronically Signed   By: Marijo Conception M.D.   On: 08/24/2019 13:18    MAU course/MDM: Quantitative hCG ordered  Abd Pain in early pregnancy with normal rise in Quant and hemodynamically stable. GC/Chlamyia still pending   Assessment: [redacted]w[redacted]d weeks gestation w/ adequate rise in Quant (64%) 1. Pregnancy of unknown anatomic location   2. Abdominal pain during pregnancy, first trimester    Plan: Discharge home in stable condition. Ectopic and SAB precautions F/U US in 1 week Ectopic precautions.  Follow-up Information    [facility] Follow up in 1 week(s).   Specialty: Maternal and Fetal Medicine Why: will call you to schedule ultrasound Contact information: [address] [place], [place] [id number] [place] [id number] [facility] Follow up.   Specialty: Obstetrics and Gynecology Why: as needed if symptoms worsen Contact information: [address] [phone] [place] [phone]         Allergies  as of 08/26/2019      Reactions   Apple Hives   Banana Hives   Peanut-containing Drug Products Hives   Pear Hives   Plum Pulp Hives      Medication List    STOP taking these medications   albuterol 108 (90 Base) MCG/ACT inhaler Commonly known as: VENTOLIN  HFA   Apri 0.15-30 MG-MCG tablet Generic drug: desogestrel-ethinyl estradiol   Azelastine HCl 0.15 % Soln   benzonatate 100 MG capsule Commonly known as: TESSALON   cyclobenzaprine 5 MG tablet Commonly known as: FLEXERIL   eletriptan 40 MG tablet Commonly known as: Relpax   famotidine 20 MG tablet Commonly known as: PEPCID   levocetirizine 5 MG tablet Commonly known as: XYZAL   montelukast 10 MG tablet Commonly known as: SINGULAIR   norgestimate-ethinyl estradiol 0.25-35 MG-MCG tablet Commonly known as: Sprintec 28   nortriptyline 25 MG capsule Commonly known as: PAMELOR   Olopatadine HCl 0.2 % Soln Commonly known as: Pataday     TAKE these medications   multivitamin-prenatal 27-0.8 MG Tabs tablet Take 1 tablet by mouth daily at 12 noon.       Katrinka Blazing, [place], CNM 08/26/2019, 2:10 PM  2/3

## 2019-08-26 NOTE — Discharge Instructions (Signed)
Abdominal Pain During Pregnancy  Abdominal pain is common during pregnancy, and has many possible causes. Some causes are more serious than others, and sometimes the cause is not known. Abdominal pain can be a sign that labor is starting. It can also be caused by normal growth and stretching of muscles and ligaments during pregnancy. Always tell your health care provider if you have any abdominal pain. Follow these instructions at home:  Do not have sex or put anything in your vagina until your pain goes away completely.  Get plenty of rest until your pain improves.  Drink enough fluid to keep your urine pale yellow.  Take over-the-counter and prescription medicines only as told by your health care provider.  Keep all follow-up visits as told by your health care provider. This is important. Contact a health care provider if:  Your pain continues or gets worse after resting.  You have lower abdominal pain that: ? Comes and goes at regular intervals. ? Spreads to your back. ? Is similar to menstrual cramps.  You have pain or burning when you urinate. Get help right away if:  You have a fever or chills.  You have vaginal bleeding.  You are leaking fluid from your vagina.  You are passing tissue from your vagina.  You have vomiting or diarrhea that lasts for more than 24 hours.  Your baby is moving less than usual.  You feel very weak or faint.  You have shortness of breath.  You develop severe pain in your upper abdomen. Summary  Abdominal pain is common during pregnancy, and has many possible causes.  If you experience abdominal pain during pregnancy, tell your health care provider right away.  Follow your health care provider's home care instructions and keep all follow-up visits as directed. This information is not intended to replace advice given to you by your health care provider. Make sure you discuss any questions you have with your health care  provider. Document Revised: 10/02/2018 Document Reviewed: 09/16/2016 Elsevier Patient Education  2020 Elsevier Inc.  

## 2019-08-26 NOTE — MAU Note (Signed)
Jessica Knapp is a 29 y.o. here in MAU reporting: here for follow up hcg. Denies bleeding but states she is still having some cramping but that it is not as severe as previous visit.   Pain score: 2/10  Vitals:   08/26/19 1157  BP: 132/78  Pulse: 94  Resp: 16  Temp: 98.7 F (37.1 C)  SpO2: 99%     Lab orders placed from triage: hcg

## 2019-08-27 LAB — GC/CHLAMYDIA PROBE AMP (~~LOC~~) NOT AT ARMC
Chlamydia: NEGATIVE
Comment: NEGATIVE
Comment: NORMAL
Neisseria Gonorrhea: NEGATIVE

## 2019-08-30 ENCOUNTER — Other Ambulatory Visit: Payer: Self-pay

## 2019-08-30 ENCOUNTER — Ambulatory Visit (INDEPENDENT_AMBULATORY_CARE_PROVIDER_SITE_OTHER): Payer: Self-pay | Admitting: *Deleted

## 2019-08-30 ENCOUNTER — Ambulatory Visit (HOSPITAL_COMMUNITY)
Admission: RE | Admit: 2019-08-30 | Discharge: 2019-08-30 | Disposition: A | Discharge status: Home / Self Care | Payer: Medicaid Other | Source: Ambulatory Visit | Attending: Advanced Practice Midwife | Admitting: Advanced Practice Midwife

## 2019-08-30 DIAGNOSIS — O26891 Other specified pregnancy related conditions, first trimester: Secondary | ICD-10-CM | POA: Insufficient documentation

## 2019-08-30 DIAGNOSIS — Z712 Person consulting for explanation of examination or test findings: Secondary | ICD-10-CM

## 2019-08-30 DIAGNOSIS — R109 Unspecified abdominal pain: Secondary | ICD-10-CM | POA: Insufficient documentation

## 2019-08-30 DIAGNOSIS — O3680X0 Pregnancy with inconclusive fetal viability, not applicable or unspecified: Secondary | ICD-10-CM | POA: Diagnosis not present

## 2019-08-30 NOTE — Progress Notes (Signed)
Patient seen and assessed by nursing staff during this encounter. I have reviewed the chart and agree with the documentation and plan.  [place] Bing, MD 08/30/2019 2:21 PM

## 2019-08-30 NOTE — Progress Notes (Signed)
[place] results reviewed by Dr. Vergie Living. Pt informed of results showing viable intrauterine pregnancy @ 5w 6d. Pt has sure LMP of 04/21/19 which correlates with EGA 6w 4d. She was advised that EDD will be determined by provider @ her first prenatal visit. Pt is currently undecided about where she would like to receive prenatal care. She will call back to schedule appt if desired. Picture from [place] provided to pt. Pt voiced understanding of all information and instructions given.

## 2019-10-29 ENCOUNTER — Ambulatory Visit (INDEPENDENT_AMBULATORY_CARE_PROVIDER_SITE_OTHER): Payer: Medicaid Other | Admitting: *Deleted

## 2019-10-29 ENCOUNTER — Other Ambulatory Visit: Payer: Self-pay

## 2019-10-29 DIAGNOSIS — Z3402 Encounter for supervision of normal first pregnancy, second trimester: Secondary | ICD-10-CM | POA: Insufficient documentation

## 2019-10-29 DIAGNOSIS — O2620 Pregnancy care for patient with recurrent pregnancy loss, unspecified trimester: Secondary | ICD-10-CM

## 2019-10-29 DIAGNOSIS — O99210 Obesity complicating pregnancy, unspecified trimester: Secondary | ICD-10-CM | POA: Insufficient documentation

## 2019-10-29 DIAGNOSIS — O0990 Supervision of high risk pregnancy, unspecified, unspecified trimester: Secondary | ICD-10-CM

## 2019-10-29 MED ORDER — BLOOD PRESSURE KIT DEVI: 1.0000 | 0 refills | Status: AC | PRN

## 2019-10-29 NOTE — Progress Notes (Signed)
I connected with  GRACIANA SESSA on 10/29/19 at  3:30 PM EDT by telephone and verified that I am speaking with the correct person using two identifiers.   I discussed the limitations, risks, security and privacy concerns of performing an evaluation and management service by telephone and the availability of in person appointments. I also discussed with the patient that there may be a patient responsible charge related to this service. The patient expressed understanding and agreed to proceed.  I explained I am completing her New OB Intake today. We discussed Her EDD and that it is based on  sure LMP . I reviewed her allergies, meds, OB History, Medical /Surgical history, and appropriate screenings. I informed her of [facility] services.  I explained I will send her the Babyscripts app and app was sent to her while on phone.  I explained we will send a blood pressure cuff to [facility] that will fill that prescription and we called [facility] to verify they received her prescription and confirmed she will pick up the cuff.  I asked her to bring the blood pressure cuff with her to her first ob appointment so we can show her how to use it. Explained  then we will have her take her blood pressure weekly and enter into the app. I explained she will have some visits in office and some virtually. She already has Sports coach. I reviewed her new ob  appointment date/ time with her , our location and to wear mask, no visitors.  I explained she will have a pelvic exam, ob bloodwork, hemoglobin a1C, cbg ,pap, and  genetic testing if desired,- she does want a panorama. I scheduled an [place] at 19 weeks and gave her the appointment. She voices understanding.   Linda,RN 10/29/2019  3:44 PM

## 2019-10-29 NOTE — Patient Instructions (Signed)

## 2019-10-30 ENCOUNTER — Ambulatory Visit (INDEPENDENT_AMBULATORY_CARE_PROVIDER_SITE_OTHER): Payer: Medicaid Other | Admitting: Student

## 2019-10-30 ENCOUNTER — Other Ambulatory Visit (HOSPITAL_COMMUNITY)
Admission: RE | Admit: 2019-10-30 | Discharge: 2019-10-30 | Disposition: A | Discharge status: Home / Self Care | Payer: Medicaid Other | Source: Ambulatory Visit | Attending: Student | Admitting: Student

## 2019-10-30 ENCOUNTER — Encounter: Payer: Self-pay | Admitting: Student

## 2019-10-30 VITALS — BP 110/69 | HR 87 | Wt 239.2 lb

## 2019-10-30 DIAGNOSIS — O0992 Supervision of high risk pregnancy, unspecified, second trimester: Secondary | ICD-10-CM | POA: Insufficient documentation

## 2019-10-30 DIAGNOSIS — Z3402 Encounter for supervision of normal first pregnancy, second trimester: Secondary | ICD-10-CM | POA: Diagnosis not present

## 2019-10-30 DIAGNOSIS — Z3A15 15 weeks gestation of pregnancy: Secondary | ICD-10-CM | POA: Diagnosis not present

## 2019-10-30 NOTE — Progress Notes (Signed)
Medicaid Home Form Completed-10/30/19

## 2019-10-30 NOTE — Patient Instructions (Signed)
Considering Waterbirth? °Guide for patients at [facility] °Why consider waterbirth? °• Gentle birth for babies  °• Less pain medicine used in labor  °• May allow for passive descent/less pushing  °• May reduce perineal tears  °• More mobility and instinctive maternal position changes  °• Increased maternal relaxation  °• Reduced blood pressure in labor  ° °Is waterbirth safe? What are the risks of infection, drowning or other complications? °• Infection:  °• Very low risk (3.7 % for tub vs 4.8% for bed)  °• 7 in 8000 waterbirths with documented infection  °• Poorly cleaned equipment most common cause  °• Slightly lower group B strep transmission rate  °• Drowning  °• Maternal:  °• Very low risk  °• Related to seizures or fainting  °• Newborn:  °• Very low risk. No evidence of increased risk of respiratory problems in multiple large studies  °• Physiological protection from breathing under water  °• Avoid underwater birth if there are any fetal complications  °• Once baby's head is out of the water, keep it out.  °• Birth complication  °• Some reports of cord trauma, but risk decreased by bringing baby to surface gradually  °• No evidence of increased risk of shoulder dystocia. Mothers can usually change positions faster in water than in a bed, possibly aiding the maneuvers to free the shoulder.  °? °You must attend a Waterbirth class at [facility] °• 3rd Wednesday of every month from 7-9pm  °• Free  °• Register by calling [phone] or online at [email or web address]  °• Bring us the certificate from the class to your prenatal appointment  °Meet with a midwife at 36 weeks to see if you can still plan a waterbirth and to sign the consent.  ° °If you plan a waterbirth at [facility], you can opt to purchase the following: °• Fish Net °• Bathing suit top (optional)  °• Long-handled mirror (optional)  °•  °Things that would  prevent you from having a waterbirth: °• Unknown or Positive COVID-19 diagnosis upon admission to hospital  °• Premature, <37wks  °• Previous cesarean birth  °• Presence of thick meconium-stained fluid  °• Multiple gestation (Twins, triplets, etc.)  °• Uncontrolled diabetes or gestational diabetes requiring medication  °• Hypertension requiring medication or diagnosis of pre-eclampsia  °• Heavy vaginal bleeding  °• Non-reassuring fetal heart rate  °• Active infection (MRSA, etc.). Group B Strep is NOT a contraindication for waterbirth.  °• If your labor has to be induced and induction method requires continuous monitoring of the baby's heart rate  °• Other risks/issues identified by your obstetrical provider  °Please remember that birth is unpredictable. Under certain unforeseeable circumstances your provider may advise against giving birth in the tub. These decisions will be made on a case-by-case basis and with the safety of you and your baby as our highest priority. ° °**Please remember that in order to have a waterbirth, you must test Negative to COVID-19 upon admission to the hospital.** ° °
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
## 2019-10-31 LAB — OBSTETRIC PANEL, INCLUDING HIV
Antibody Screen: NEGATIVE
Basophils Absolute: 0 10*3/uL (ref 0.0–0.2)
Basos: 1 %
EOS (ABSOLUTE): 0 10*3/uL (ref 0.0–0.4)
Eos: 1 %
HIV Screen 4th Generation wRfx: NONREACTIVE
Hematocrit: 41 % (ref 34.0–46.6)
Hemoglobin: 13.8 g/dL (ref 11.1–15.9)
Hepatitis B Surface Ag: NEGATIVE
Immature Grans (Abs): 0.1 10*3/uL (ref 0.0–0.1)
Immature Granulocytes: 1 %
Lymphocytes Absolute: 2.9 10*3/uL (ref 0.7–3.1)
Lymphs: 32 %
MCH: 32.9 pg (ref 26.6–33.0)
MCHC: 33.7 g/dL (ref 31.5–35.7)
MCV: 98 fL — ABNORMAL HIGH (ref 79–97)
Monocytes Absolute: 0.8 10*3/uL (ref 0.1–0.9)
Monocytes: 10 %
Neutrophils Absolute: 5 10*3/uL (ref 1.4–7.0)
Neutrophils: 55 %
Platelets: 298 10*3/uL (ref 150–450)
RBC: 4.19 x10E6/uL (ref 3.77–5.28)
RDW: 12.4 % (ref 11.7–15.4)
RPR Ser Ql: NONREACTIVE
Rh Factor: POSITIVE
Rubella Antibodies, IGG: 2.78 index (ref >0.99)
WBC: 8.9 10*3/uL (ref 3.4–10.8)

## 2019-10-31 LAB — HEMOGLOBIN A1C
Est. average glucose Bld gHb Est-mCnc: 97 mg/dL
Hgb A1c MFr Bld: 5 % (ref 4.8–5.6)

## 2019-10-31 LAB — CERVICOVAGINAL ANCILLARY ONLY
Chlamydia: NEGATIVE
Comment: NEGATIVE
Comment: NORMAL
Neisseria Gonorrhea: NEGATIVE

## 2019-11-01 ENCOUNTER — Encounter: Payer: Self-pay | Admitting: *Deleted

## 2019-11-01 LAB — URINE CULTURE, OB REFLEX

## 2019-11-01 LAB — CULTURE, OB URINE

## 2019-11-05 ENCOUNTER — Encounter: Payer: Self-pay | Admitting: *Deleted

## 2019-11-08 NOTE — Progress Notes (Signed)
Chart reviewed for nurse visit. Agree with plan of care.   Marylene Land, CNM 11/08/2019 5:52 PM

## 2019-11-27 ENCOUNTER — Ambulatory Visit: Payer: Medicaid Other | Admitting: *Deleted

## 2019-11-27 ENCOUNTER — Other Ambulatory Visit: Payer: Self-pay | Admitting: *Deleted

## 2019-11-27 ENCOUNTER — Encounter: Payer: Self-pay | Admitting: *Deleted

## 2019-11-27 ENCOUNTER — Ambulatory Visit: Payer: Medicaid Other | Attending: Student

## 2019-11-27 ENCOUNTER — Other Ambulatory Visit: Payer: Medicaid Other

## 2019-11-27 ENCOUNTER — Telehealth (INDEPENDENT_AMBULATORY_CARE_PROVIDER_SITE_OTHER): Payer: Medicaid Other | Admitting: Family Medicine

## 2019-11-27 ENCOUNTER — Other Ambulatory Visit: Payer: Self-pay

## 2019-11-27 DIAGNOSIS — O2620 Pregnancy care for patient with recurrent pregnancy loss, unspecified trimester: Secondary | ICD-10-CM

## 2019-11-27 DIAGNOSIS — O2622 Pregnancy care for patient with recurrent pregnancy loss, second trimester: Secondary | ICD-10-CM

## 2019-11-27 DIAGNOSIS — Z3402 Encounter for supervision of normal first pregnancy, second trimester: Secondary | ICD-10-CM | POA: Insufficient documentation

## 2019-11-27 DIAGNOSIS — O99612 Diseases of the digestive system complicating pregnancy, second trimester: Secondary | ICD-10-CM

## 2019-11-27 DIAGNOSIS — E669 Obesity, unspecified: Secondary | ICD-10-CM

## 2019-11-27 DIAGNOSIS — O99210 Obesity complicating pregnancy, unspecified trimester: Secondary | ICD-10-CM

## 2019-11-27 DIAGNOSIS — Z3A19 19 weeks gestation of pregnancy: Secondary | ICD-10-CM

## 2019-11-27 DIAGNOSIS — O0992 Supervision of high risk pregnancy, unspecified, second trimester: Secondary | ICD-10-CM

## 2019-11-27 DIAGNOSIS — Z363 Encounter for antenatal screening for malformations: Secondary | ICD-10-CM

## 2019-11-27 DIAGNOSIS — O0990 Supervision of high risk pregnancy, unspecified, unspecified trimester: Secondary | ICD-10-CM

## 2019-11-27 DIAGNOSIS — K219 Gastro-esophageal reflux disease without esophagitis: Secondary | ICD-10-CM

## 2019-11-27 DIAGNOSIS — O99212 Obesity complicating pregnancy, second trimester: Secondary | ICD-10-CM | POA: Diagnosis not present

## 2019-11-27 MED ORDER — FAMOTIDINE 20 MG PO TABS: 20.0000 mg | ORAL_TABLET | Freq: Two times a day (BID) | ORAL | 3 refills | Status: AC
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
## 2019-11-27 NOTE — Progress Notes (Signed)
I connected with  Jessica Knapp on 11/27/19 at  1:35 PM EDT by telephone and verified that I am speaking with the correct person using two identifiers.   I discussed the limitations, risks, security and privacy concerns of performing an evaluation and management service by telephone and the availability of in person appointments. I also discussed with the patient that there may be a patient responsible charge related to this service. The patient expressed understanding and agreed to proceed.   States not at home; unable to do bp but had bp checked when she was in MFM earlier today.   Obdulio Mash,RN 11/27/2019  1:16 PM

## 2019-11-27 NOTE — Patient Instructions (Signed)
Eating Plan for Pregnant Women While you are pregnant, your body requires additional nutrition to help support your growing baby. You also have a higher need for some vitamins and minerals, such as folic acid, calcium, iron, and vitamin D. Eating a healthy, well-balanced diet is very important for your health and your baby's health. Your need for extra calories varies for the three 3-month segments of your pregnancy (trimesters). For most women, it is recommended to consume:  150 extra calories a day during the first trimester.  300 extra calories a day during the second trimester.  300 extra calories a day during the third trimester. What are tips for following this plan?   Do not try to lose weight or go on a diet during pregnancy.  Limit your overall intake of foods that have "empty calories." These are foods that have little nutritional value, such as sweets, desserts, candies, and sugar-sweetened beverages.  Eat a variety of foods (especially fruits and vegetables) to get a full range of vitamins and minerals.  Take a prenatal vitamin to help meet your additional vitamin and mineral needs during pregnancy, specifically for folic acid, iron, calcium, and vitamin D.  Remember to stay active. Ask your health care provider what types of exercise and activities are safe for you.  Practice good food safety and cleanliness. Wash your hands before you eat and after you prepare raw meat. Wash all fruits and vegetables well before peeling or eating. Taking these actions can help to prevent food-borne illnesses that can be very dangerous to your baby, such as listeriosis. Ask your health care provider for more information about listeriosis. What does 150 extra calories look like? Healthy options that provide 150 extra calories each day could be any of the following:  6-8 oz (170-230 g) of plain low-fat yogurt with  cup of berries.  1 apple with 2 teaspoons (11 g) of peanut butter.  Cut-up  vegetables with  cup (60 g) of hummus.  8 oz (230 mL) or 1 cup of low-fat chocolate milk.  1 stick of string cheese with 1 medium orange.  1 peanut butter and jelly sandwich that is made with one slice of whole-wheat bread and 1 tsp (5 g) of peanut butter. For 300 extra calories, you could eat two of those healthy options each day. What is a healthy amount of weight to gain? The right amount of weight gain for you is based on your BMI before you became pregnant. If your BMI:  Was less than 18 (underweight), you should gain 28-40 lb (13-18 kg).  Was 18-24.9 (normal), you should gain 25-35 lb (11-16 kg).  Was 25-29.9 (overweight), you should gain 15-25 lb (7-11 kg).  Was 30 or greater (obese), you should gain 11-20 lb (5-9 kg). What if I am having twins or multiples? Generally, if you are carrying twins or multiples:  You may need to eat 300-600 extra calories a day.  The recommended range for total weight gain is 25-54 lb (11-25 kg), depending on your BMI before pregnancy.  Talk with your health care provider to find out about nutritional needs, weight gain, and exercise that is right for you. What foods can I eat?  Fruits All fruits. Eat a variety of colors and types of fruit. Remember to wash your fruits well before peeling or eating. Vegetables All vegetables. Eat a variety of colors and types of vegetables. Remember to wash your vegetables well before peeling or eating. Grains All grains. Choose whole grains, such   as whole-wheat bread, oatmeal, or brown rice. Meats and other protein foods Lean meats, including chicken, turkey, fish, and lean cuts of beef, veal, or pork. If you eat fish or seafood, choose options that are higher in omega-3 fatty acids and lower in mercury, such as salmon, herring, mussels, trout, sardines, pollock, shrimp, crab, and lobster. Tofu. Tempeh. Beans. Eggs. Peanut butter and other nut butters. Make sure that all meats, poultry, and eggs are cooked to  food-safe temperatures or "well-done." Two or more servings of fish are recommended each week in order to get the most benefits from omega-3 fatty acids that are found in seafood. Choose fish that are lower in mercury. You can find more information online:  www.fda.gov Dairy Pasteurized milk and milk alternatives (such as almond milk). Pasteurized yogurt and pasteurized cheese. Cottage cheese. Sour cream. Beverages Water. Juices that contain 100% fruit juice or vegetable juice. Caffeine-free teas and decaffeinated coffee. Drinks that contain caffeine are okay to drink, but it is better to avoid caffeine. Keep your total caffeine intake to less than 200 mg each day (which is 12 oz or 355 mL of coffee, tea, or soda) or the limit as told by your health care provider. Fats and oils Fats and oils are okay to include in moderation. Sweets and desserts Sweets and desserts are okay to include in moderation. Seasoning and other foods All pasteurized condiments. The items listed above may not be a complete list of foods and beverages you can eat. Contact a dietitian for more information. What foods are not recommended? Fruits Unpasteurized fruit juices. Vegetables Raw (unpasteurized) vegetable juices. Meats and other protein foods Lunch meats, bologna, hot dogs, or other deli meats. (If you must eat those meats, reheat them until they are steaming hot.) Refrigerated pat, meat spreads from a meat counter, smoked seafood that is found in the refrigerated section of a store. Raw or undercooked meats, poultry, and eggs. Raw fish, such as sushi or sashimi. Fish that have high mercury content, such as tilefish, shark, swordfish, and king mackerel. To learn more about mercury in fish, talk with your health care provider or look for online resources, such as:  www.fda.gov Dairy Raw (unpasteurized) milk and any foods that have raw milk in them. Soft cheeses, such as feta, queso blanco, queso fresco, Brie,  Camembert cheeses, blue-veined cheeses, and Panela cheese (unless it is made with pasteurized milk, which must be stated on the label). Beverages Alcohol. Sugar-sweetened beverages, such as sodas, teas, or energy drinks. Seasoning and other foods Homemade fermented foods and drinks, such as pickles, sauerkraut, or kombucha drinks. (Store-bought pasteurized versions of these are okay.) Salads that are made in a store or deli, such as ham salad, chicken salad, egg salad, tuna salad, and seafood salad. The items listed above may not be a complete list of foods and beverages you should avoid. Contact a dietitian for more information. Where to find more information To calculate the number of calories you need based on your height, weight, and activity level, you can use an online calculator such as:  www.choosemyplate.gov/MyPlatePlan To calculate how much weight you should gain during pregnancy, you can use an online pregnancy weight gain calculator such as:  www.choosemyplate.gov/pregnancy-weight-gain-calculator Summary  While you are pregnant, your body requires additional nutrition to help support your growing baby.  Eat a variety of foods, especially fruits and vegetables to get a full range of vitamins and minerals.  Practice good food safety and cleanliness. Wash your hands before you eat   and after you prepare raw meat. Wash all fruits and vegetables well before peeling or eating. Taking these actions can help to prevent food-borne illnesses, such as listeriosis, that can be very dangerous to your baby.  Do not eat raw meat or fish. Do not eat fish that have high mercury content, such as tilefish, shark, swordfish, and king mackerel. Do not eat unpasteurized (raw) dairy.  Take a prenatal vitamin to help meet your additional vitamin and mineral needs during pregnancy, specifically for folic acid, iron, calcium, and vitamin D. This information is not intended to replace advice given to you by  your health care provider. Make sure you discuss any questions you have with your health care provider. Document Revised: 11/02/2018 Document Reviewed: 03/11/2017 Elsevier Patient Education  2020 Elsevier Inc.  

## 2019-11-29 LAB — AFP, SERUM, OPEN SPINA BIFIDA
AFP MoM: 1.07
AFP Value: 46 ng/mL
Gest. Age on Collection Date: 19.1 weeks
Maternal Age At EDD: 27.8 yr
OSBR Risk 1 IN: 10000
Test Results:: NEGATIVE
Weight: 239 [lb_av]

## 2019-12-24 ENCOUNTER — Encounter: Payer: Self-pay | Admitting: *Deleted

## 2019-12-26 ENCOUNTER — Ambulatory Visit (INDEPENDENT_AMBULATORY_CARE_PROVIDER_SITE_OTHER): Payer: Medicaid Other

## 2019-12-26 ENCOUNTER — Other Ambulatory Visit: Payer: Self-pay

## 2019-12-26 VITALS — BP 132/78 | HR 88 | Wt 241.5 lb

## 2019-12-26 DIAGNOSIS — M549 Dorsalgia, unspecified: Secondary | ICD-10-CM

## 2019-12-26 DIAGNOSIS — E669 Obesity, unspecified: Secondary | ICD-10-CM

## 2019-12-26 DIAGNOSIS — O99891 Other specified diseases and conditions complicating pregnancy: Secondary | ICD-10-CM

## 2019-12-26 DIAGNOSIS — O99210 Obesity complicating pregnancy, unspecified trimester: Secondary | ICD-10-CM

## 2019-12-26 DIAGNOSIS — O26892 Other specified pregnancy related conditions, second trimester: Secondary | ICD-10-CM

## 2019-12-26 DIAGNOSIS — Z3402 Encounter for supervision of normal first pregnancy, second trimester: Secondary | ICD-10-CM

## 2019-12-26 DIAGNOSIS — Z3A23 23 weeks gestation of pregnancy: Secondary | ICD-10-CM

## 2019-12-26 MED ORDER — ASPIRIN EC 81 MG PO TBEC
81.0000 mg | DELAYED_RELEASE_TABLET | Freq: Every day | ORAL | 2 refills | Status: AC
Start: 2019-12-26 — End: ?

## 2019-12-26 MED ORDER — CYCLOBENZAPRINE HCL 10 MG PO TABS
10.0000 mg | ORAL_TABLET | Freq: Every day | ORAL | 1 refills | Status: DC
Start: 2019-12-26 — End: 2020-01-22
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
## 2019-12-26 NOTE — Patient Instructions (Addendum)
Back Pain in Pregnancy Back pain during pregnancy is common. Back pain may be caused by several factors that are related to changes during your pregnancy. Follow these instructions at home: Managing pain, stiffness, and swelling      If directed, for sudden (acute) back pain, put ice on the painful area. ? Put ice in a plastic bag. ? Place a towel between your skin and the bag. ? Leave the ice on for 20 minutes, 2-3 times per day.  If directed, apply heat to the affected area before you exercise. Use the heat source that your health care provider recommends, such as a moist heat pack or a heating pad. ? Place a towel between your skin and the heat source. ? Leave the heat on for 20-30 minutes. ? Remove the heat if your skin turns bright red. This is especially important if you are unable to feel pain, heat, or cold. You may have a greater risk of getting burned.  If directed, massage the affected area. Activity  Exercise as told by your health care provider. Gentle exercise is the best way to prevent or manage back pain.  Listen to your body when lifting. If lifting hurts, ask for help or bend your knees. This uses your leg muscles instead of your back muscles.  Squat down when picking up something from the floor. Do not bend over.  Only use bed rest for short periods as told by your health care provider. Bed rest should only be used for the most severe episodes of back pain. Standing, sitting, and lying down  Do not stand in one place for long periods of time.  Use good posture when sitting. Make sure your head rests over your shoulders and is not hanging forward. Use a pillow on your lower back if necessary.  Try sleeping on your side, preferably the left side, with a pregnancy support pillow or 1-2 regular pillows between your legs. ? If you have back pain after a night's rest, your bed may be too soft. ? A firm mattress may provide more support for your back during  pregnancy. General instructions  Do not wear high heels.  Eat a healthy diet. Try to gain weight within your health care provider's recommendations.  Use a maternity girdle, elastic sling, or back brace as told by your health care provider.  Take over-the-counter and prescription medicines only as told by your health care provider.  Work with a physical therapist or massage therapist to find ways to manage back pain. Acupuncture or massage therapy may be helpful.  Keep all follow-up visits as told by your health care provider. This is important. Contact a health care provider if:  Your back pain interferes with your daily activities.  You have increasing pain in other parts of your body. Get help right away if:  You develop numbness, tingling, weakness, or problems with the use of your arms or legs.  You develop severe back pain that is not controlled with medicine.  You have a change in bowel or bladder control.  You develop shortness of breath, dizziness, or you faint.  You develop nausea, vomiting, or sweating.  You have back pain that is a rhythmic, cramping pain similar to labor pains. Labor pain is usually 1-2 minutes apart, lasts for about 1 minute, and involves a bearing down feeling or pressure in your pelvis.  You have back pain and your water breaks or you have vaginal bleeding.  You have back pain or numbness  that travels down your leg.  Your back pain developed after you fell.  You develop pain on one side of your back.  You see blood in your urine.  You develop skin blisters in the area of your back pain. Summary  Back pain may be caused by several factors that are related to changes during your pregnancy.  Follow instructions as told by your health care provider for managing pain, stiffness, and swelling.  Exercise as told by your health care provider. Gentle exercise is the best way to prevent or manage back pain.  Take over-the-counter and  prescription medicines only as told by your health care provider.  Keep all follow-up visits as told by your health care provider. This is important. This information is not intended to replace advice given to you by your health care provider. Make sure you discuss any questions you have with your health care provider. Document Revised: 10/03/2018 Document Reviewed: 11/30/2017 Elsevier Patient Education  2020 [employer]  Sciatica  Sciatica is pain, numbness, weakness, or tingling along the path of the sciatic nerve. The sciatic nerve starts in the lower back and runs down the back of each leg. The nerve controls the muscles in the lower leg and in the back of the knee. It also provides feeling (sensation) to the back of the thigh, the lower leg, and the sole of the foot. Sciatica is a symptom of another medical condition that pinches or puts pressure on the sciatic nerve. Sciatica most often only affects one side of the body. Sciatica usually goes away on its own or with treatment. In some cases, sciatica may come back (recur). What are the causes? This condition is caused by pressure on the sciatic nerve or pinching of the nerve. This may be the result of:  A disk in between the bones of the spine bulging out too far (herniated disk).  Age-related changes in the spinal disks.  A pain disorder that affects a muscle in the buttock.  Extra bone growth near the sciatic nerve.  A break (fracture) of the pelvis.  Pregnancy.  Tumor. This is rare. What increases the risk? The following factors may make you more likely to develop this condition:  Playing sports that place pressure or stress on the spine.  Having poor strength and flexibility.  A history of back injury or surgery.  Sitting for long periods of time.  Doing activities that involve repetitive bending or lifting.  Obesity. What are the signs or symptoms? Symptoms can vary from mild to very severe, and they may  include:  Any of these problems in the lower back, leg, hip, or buttock: ? Mild tingling, numbness, or dull aches. ? Burning sensations. ? Sharp pains.  Numbness in the back of the calf or the sole of the foot.  Leg weakness.  Severe back pain that makes movement difficult. Symptoms may get worse when you cough, sneeze, or laugh, or when you sit or stand for long periods of time. How is this diagnosed? This condition may be diagnosed based on:  Your symptoms and medical history.  A physical exam.  Blood tests.  Imaging tests, such as: ? X-rays. ? MRI. ? CT scan. How is this treated? In many cases, this condition improves on its own without treatment. However, treatment may include:  Reducing or modifying physical activity.  Exercising and stretching.  Icing and applying heat to the affected area.  Medicines that help to: ? Relieve pain and swelling. ? Relax  your muscles.  Injections of medicines that help to relieve pain, irritation, and inflammation around the sciatic nerve (steroids).  Surgery. Follow these instructions at home: Medicines  Take over-the-counter and prescription medicines only as told by your health care provider.  Ask your health care provider if the medicine prescribed to you: ? Requires you to avoid driving or using heavy machinery. ? Can cause constipation. You may need to take these actions to prevent or treat constipation:  Drink enough fluid to keep your urine pale yellow.  Take over-the-counter or prescription medicines.  Eat foods that are high in fiber, such as beans, whole grains, and fresh fruits and vegetables.  Limit foods that are high in fat and processed sugars, such as fried or sweet foods. Managing pain      If directed, put ice on the affected area. ? Put ice in a plastic bag. ? Place a towel between your skin and the bag. ? Leave the ice on for 20 minutes, 2-3 times a day.  If directed, apply heat to the  affected area. Use the heat source that your health care provider recommends, such as a moist heat pack or a heating pad. ? Place a towel between your skin and the heat source. ? Leave the heat on for 20-30 minutes. ? Remove the heat if your skin turns bright red. This is especially important if you are unable to feel pain, heat, or cold. You may have a greater risk of getting burned. Activity   Return to your normal activities as told by your health care provider. Ask your health care provider what activities are safe for you.  Avoid activities that make your symptoms worse.  Take brief periods of rest throughout the day. ? When you rest for longer periods, mix in some mild activity or stretching between periods of rest. This will help to prevent stiffness and pain. ? Avoid sitting for long periods of time without moving. Get up and move around at least one time each hour.  Exercise and stretch regularly, as told by your health care provider.  Do not lift anything that is heavier than 10 lb (4.5 kg) while you have symptoms of sciatica. When you do not have symptoms, you should still avoid heavy lifting, especially repetitive heavy lifting.  When you lift objects, always use proper lifting technique, which includes: ? Bending your knees. ? Keeping the load close to your body. ? Avoiding twisting. General instructions  Maintain a healthy weight. Excess weight puts extra stress on your back.  Wear supportive, comfortable shoes. Avoid wearing high heels.  Avoid sleeping on a mattress that is too soft or too hard. A mattress that is firm enough to support your back when you sleep may help to reduce your pain.  Keep all follow-up visits as told by your health care provider. This is important. Contact a health care provider if:  You have pain that: ? Wakes you up when you are sleeping. ? Gets worse when you lie down. ? Is worse than you have experienced in the past. ? Lasts longer  than 4 weeks.  You have an unexplained weight loss. Get help right away if:  You are not able to control when you urinate or have bowel movements (incontinence).  You have: ? Weakness in your lower back, pelvis, buttocks, or legs that gets worse. ? Redness or swelling of your back. ? A burning sensation when you urinate. Summary  Sciatica is pain, numbness, weakness, or tingling  along the path of the sciatic nerve.  This condition is caused by pressure on the sciatic nerve or pinching of the nerve.  Sciatica can cause pain, numbness, or tingling in the lower back, legs, hips, and buttocks.  Treatment often includes rest, exercise, medicines, and applying ice or heat. This information is not intended to replace advice given to you by your health care provider. Make sure you discuss any questions you have with your health care provider. Document Revised: 07/03/2018 Document Reviewed: 07/03/2018 Elsevier Patient Education  2020 [employer].

## 2020-01-22 ENCOUNTER — Other Ambulatory Visit: Payer: Self-pay

## 2020-01-22 ENCOUNTER — Other Ambulatory Visit: Payer: Self-pay | Admitting: *Deleted

## 2020-01-22 ENCOUNTER — Ambulatory Visit (INDEPENDENT_AMBULATORY_CARE_PROVIDER_SITE_OTHER): Payer: Medicaid Other | Admitting: Certified Nurse Midwife

## 2020-01-22 ENCOUNTER — Other Ambulatory Visit: Payer: Medicaid Other

## 2020-01-22 ENCOUNTER — Ambulatory Visit: Payer: Medicaid Other | Attending: Obstetrics and Gynecology

## 2020-01-22 ENCOUNTER — Other Ambulatory Visit: Payer: Self-pay | Admitting: General Practice

## 2020-01-22 VITALS — BP 116/73 | HR 128 | Wt 245.0 lb

## 2020-01-22 DIAGNOSIS — O99210 Obesity complicating pregnancy, unspecified trimester: Secondary | ICD-10-CM | POA: Insufficient documentation

## 2020-01-22 DIAGNOSIS — E669 Obesity, unspecified: Secondary | ICD-10-CM

## 2020-01-22 DIAGNOSIS — O2622 Pregnancy care for patient with recurrent pregnancy loss, second trimester: Secondary | ICD-10-CM

## 2020-01-22 DIAGNOSIS — Z3402 Encounter for supervision of normal first pregnancy, second trimester: Secondary | ICD-10-CM

## 2020-01-22 DIAGNOSIS — Z3A27 27 weeks gestation of pregnancy: Secondary | ICD-10-CM

## 2020-01-22 DIAGNOSIS — Z23 Encounter for immunization: Secondary | ICD-10-CM

## 2020-01-22 DIAGNOSIS — O99213 Obesity complicating pregnancy, third trimester: Secondary | ICD-10-CM

## 2020-01-22 DIAGNOSIS — O99212 Obesity complicating pregnancy, second trimester: Secondary | ICD-10-CM

## 2020-01-22 DIAGNOSIS — Z362 Encounter for other antenatal screening follow-up: Secondary | ICD-10-CM

## 2020-01-22 MED ORDER — COMFORT FIT MATERNITY SUPP LG MISC
1.0000 [IU] | Freq: Every day | 0 refills | Status: AC
Start: 2020-01-22 — End: ?

## 2020-01-22 MED ORDER — CYCLOBENZAPRINE HCL 10 MG PO TABS: 10.0000 mg | ORAL_TABLET | Freq: Every day | ORAL | 1 refills | Status: AC

## 2020-01-22 MED ORDER — MAG-OXIDE 200 MG PO TABS
400.0000 mg | ORAL_TABLET | Freq: Every day | ORAL | 3 refills | Status: DC
Start: 2020-01-22 — End: 2020-02-05

## 2020-01-22 NOTE — Patient Instructions (Addendum)
https://www.cdc.gov/vaccines/hcp/vis/vis-statements/tdap.pdf">  Tdap (Tetanus, Diphtheria, Pertussis) Vaccine: What You Need to Know 1. Why get vaccinated? Tdap vaccine can prevent tetanus, diphtheria, and pertussis. Diphtheria and pertussis spread from person to person. Tetanus enters the body through cuts or wounds.  TETANUS (T) causes painful stiffening of the muscles. Tetanus can lead to serious health problems, including being unable to open the mouth, having trouble swallowing and breathing, or death.  DIPHTHERIA (D) can lead to difficulty breathing, heart failure, paralysis, or death.  PERTUSSIS (aP), also known as "whooping cough," can cause uncontrollable, violent coughing which makes it hard to breathe, eat, or drink. Pertussis can be extremely serious in babies and young children, causing pneumonia, convulsions, brain damage, or death. In teens and adults, it can cause weight loss, loss of bladder control, passing out, and rib fractures from severe coughing. 2. Tdap vaccine Tdap is only for children 7 years and older, adolescents, and adults.  Adolescents should receive a single dose of Tdap, preferably at age 53 or 35 years. Pregnant women should get a dose of Tdap during every pregnancy, to protect the newborn from pertussis. Infants are most at risk for severe, life-threatening complications from pertussis. Adults who have never received Tdap should get a dose of Tdap. Also, adults should receive a booster dose every 10 years, or earlier in the case of a severe and dirty wound or burn. Booster doses can be either Tdap or Td (a different vaccine that protects against tetanus and diphtheria but not pertussis). Tdap may be given at the same time as other vaccines. 3. Talk with your health care provider Tell your vaccine provider if the person getting the vaccine:  Has had an allergic reaction after a previous dose of any vaccine that protects against tetanus, diphtheria, or pertussis,  or has any severe, life-threatening allergies.  Has had a coma, decreased level of consciousness, or prolonged seizures within 7 days after a previous dose of any pertussis vaccine (DTP, DTaP, or Tdap).  Has seizures or another nervous system problem.  Has ever had Guillain-Barr Syndrome (also called GBS).  Has had severe pain or swelling after a previous dose of any vaccine that protects against tetanus or diphtheria. In some cases, your health care provider may decide to postpone Tdap vaccination to a future visit.  People with minor illnesses, such as a cold, may be vaccinated. People who are moderately or severely ill should usually wait until they recover before getting Tdap vaccine.  Your health care provider can give you more information. 4. Risks of a vaccine reaction  Pain, redness, or swelling where the shot was given, mild fever, headache, feeling tired, and nausea, vomiting, diarrhea, or stomachache sometimes happen after Tdap vaccine. People sometimes faint after medical procedures, including vaccination. Tell your provider if you feel dizzy or have vision changes or ringing in the ears.  As with any medicine, there is a very remote chance of a vaccine causing a severe allergic reaction, other serious injury, or death. 5. What if there is a serious problem? An allergic reaction could occur after the vaccinated person leaves the clinic. If you see signs of a severe allergic reaction (hives, swelling of the face and throat, difficulty breathing, a fast heartbeat, dizziness, or weakness), call 9-1-1 and get the person to the nearest hospital. For other signs that concern you, call your health care provider.  Adverse reactions should be reported to the Vaccine Adverse Event Reporting System (VAERS). Your health care provider will usually file this report,  or you can do it yourself. Visit the VAERS website at www.vaers.LAgents.no or call 248 044 1328. VAERS is only for reporting  reactions, and VAERS staff do not give medical advice. 6. The National Vaccine Injury Compensation Program The [employer] Vaccine Injury Compensation Program (VICP) is a federal program that was created to compensate people who may have been injured by certain vaccines. Visit the VICP website at SpiritualWord.at or call 651-671-7936 to learn about the program and about filing a claim. There is a time limit to file a claim for compensation. 7. How can I learn more?  Ask your health care provider.  Call your local or state health department.  Contact the Centers for Disease Control and Prevention (CDC): ? Call 445-211-1844 (1-800-CDC-INFO) or ? Visit CDC's website at PicCapture.uy Vaccine Information Statement Tdap (Tetanus, Diphtheria, Pertussis) Vaccine (09/27/2018) This information is not intended to replace advice given to you by your health care provider. Make sure you discuss any questions you have with your health care provider. Document Revised: 10/06/2018 Document Reviewed: 10/09/2018 Elsevier Patient Education  2020 Else[employer] Inc.  Sciatica  Sciatica is pain, weakness, tingling, or loss of feeling (numbness) along the sciatic nerve. The sciatic nerve starts in the lower back and goes down the back of each leg. Sciatica usually goes away on its own or with treatment. Sometimes, sciatica may come back (recur). What are the causes? This condition happens when the sciatic nerve is pinched or has pressure put on it. This may be the result of:  A disk in between the bones of the spine bulging out too far (herniated disk).  Changes in the spinal disks that occur with aging.  A condition that affects a muscle in the butt.  Extra bone growth near the sciatic nerve.  A break (fracture) of the area between your hip bones (pelvis).  Pregnancy.  Tumor. This is rare. What increases the risk? You are more likely to develop this condition if you:  Play sports  that put pressure or stress on the spine.  Have poor strength and ease of movement (flexibility).  Have had a back injury in the past.  Have had back surgery.  Sit for long periods of time.  Do activities that involve bending or lifting over and over again.  Are very overweight (obese). What are the signs or symptoms? Symptoms can vary from mild to very bad. They may include:  Any of these problems in the lower back, leg, hip, or butt: ? Mild tingling, loss of feeling, or dull aches. ? Burning sensations. ? Sharp pains.  Loss of feeling in the back of the calf or the sole of the foot.  Leg weakness.  Very bad back pain that makes it hard to move. These symptoms may get worse when you cough, sneeze, or laugh. They may also get worse when you sit or stand for long periods of time. How is this treated? This condition often gets better without any treatment. However, treatment may include:  Changing or cutting back on physical activity when you have pain.  Doing exercises and stretching.  Putting ice or heat on the affected area.  Medicines that help: ? To relieve pain and swelling. ? To relax your muscles.  Shots (injections) of medicines that help to relieve pain, irritation, and swelling.  Surgery. Follow these instructions at home: Medicines  Take over-the-counter and prescription medicines only as told by your doctor.  Ask your doctor if the medicine prescribed to you: ? Requires you to avoid  driving or using heavy machinery. ? Can cause trouble pooping (constipation). You may need to take these steps to prevent or treat trouble pooping:  Drink enough fluids to keep your pee (urine) pale yellow.  Take over-the-counter or prescription medicines.  Eat foods that are high in fiber. These include beans, whole grains, and fresh fruits and vegetables.  Limit foods that are high in fat and sugar. These include fried or sweet foods. Managing pain      If told,  put ice on the affected area. ? Put ice in a plastic bag. ? Place a towel between your skin and the bag. ? Leave the ice on for 20 minutes, 2-3 times a day.  If told, put heat on the affected area. Use the heat source that your doctor tells you to use, such as a moist heat pack or a heating pad. ? Place a towel between your skin and the heat source. ? Leave the heat on for 20-30 minutes. ? Remove the heat if your skin turns bright red. This is very important if you are unable to feel pain, heat, or cold. You may have a greater risk of getting burned. Activity   Return to your normal activities as told by your doctor. Ask your doctor what activities are safe for you.  Avoid activities that make your symptoms worse.  Take short rests during the day. ? When you rest for a long time, do some physical activity or stretching between periods of rest. ? Avoid sitting for a long time without moving. Get up and move around at least one time each hour.  Exercise and stretch regularly, as told by your doctor.  Do not lift anything that is heavier than 10 lb (4.5 kg) while you have symptoms of sciatica. ? Avoid lifting heavy things even when you do not have symptoms. ? Avoid lifting heavy things over and over.  When you lift objects, always lift in a way that is safe for your body. To do this, you should: ? Bend your knees. ? Keep the object close to your body. ? Avoid twisting. General instructions  Stay at a healthy weight.  Wear comfortable shoes that support your feet. Avoid wearing high heels.  Avoid sleeping on a mattress that is too soft or too hard. You might have less pain if you sleep on a mattress that is firm enough to support your back.  Keep all follow-up visits as told by your doctor. This is important. Contact a doctor if:  You have pain that: ? Wakes you up when you are sleeping. ? Gets worse when you lie down. ? Is worse than the pain you have had in the  past. ? Lasts longer than 4 weeks.  You lose weight without trying. Get help right away if:  You cannot control when you pee (urinate) or poop (have a bowel movement).  You have weakness in any of these areas and it gets worse: ? Lower back. ? The area between your hip bones. ? Butt. ? Legs.  You have redness or swelling of your back.  You have a burning feeling when you pee. Summary  Sciatica is pain, weakness, tingling, or loss of feeling (numbness) along the sciatic nerve.  This condition happens when the sciatic nerve is pinched or has pressure put on it.  Sciatica can cause pain, tingling, or loss of feeling (numbness) in the lower back, legs, hips, and butt.  Treatment often includes rest, exercise, medicines, and putting  ice or heat on the affected area. This information is not intended to replace advice given to you by your health care provider. Make sure you discuss any questions you have with your health care provider. Document Revised: 07/03/2018 Document Reviewed: 07/03/2018 Elsevier Patient Education  2020 [employer].
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
## 2020-01-23 ENCOUNTER — Encounter: Payer: Self-pay | Admitting: General Practice

## 2020-01-23 LAB — HIV ANTIBODY (ROUTINE TESTING W REFLEX): HIV Screen 4th Generation wRfx: NONREACTIVE

## 2020-01-23 LAB — CBC
Hematocrit: 38.1 % (ref 34.0–46.6)
Hemoglobin: 12.8 g/dL (ref 11.1–15.9)
MCH: 33 pg (ref 26.6–33.0)
MCHC: 33.6 g/dL (ref 31.5–35.7)
MCV: 98 fL — ABNORMAL HIGH (ref 79–97)
Platelets: 299 10*3/uL (ref 150–450)
RBC: 3.88 x10E6/uL (ref 3.77–5.28)
RDW: 12.4 % (ref 11.7–15.4)
WBC: 7.2 10*3/uL (ref 3.4–10.8)

## 2020-01-23 LAB — GLUCOSE TOLERANCE, 2 HOURS W/ 1HR
Glucose, 1 hour: 122 mg/dL (ref 65–179)
Glucose, 2 hour: 75 mg/dL (ref 65–152)
Glucose, Fasting: 78 mg/dL (ref 65–91)

## 2020-01-23 LAB — RPR: RPR Ser Ql: NONREACTIVE

## 2020-02-05 ENCOUNTER — Telehealth (INDEPENDENT_AMBULATORY_CARE_PROVIDER_SITE_OTHER): Payer: Medicaid Other | Admitting: Nurse Practitioner

## 2020-02-05 ENCOUNTER — Encounter: Payer: Self-pay | Admitting: Nurse Practitioner

## 2020-02-05 ENCOUNTER — Other Ambulatory Visit: Payer: Self-pay

## 2020-02-05 VITALS — BP 129/74

## 2020-02-05 DIAGNOSIS — O99213 Obesity complicating pregnancy, third trimester: Secondary | ICD-10-CM

## 2020-02-05 DIAGNOSIS — E669 Obesity, unspecified: Secondary | ICD-10-CM

## 2020-02-05 DIAGNOSIS — O99210 Obesity complicating pregnancy, unspecified trimester: Secondary | ICD-10-CM

## 2020-02-05 DIAGNOSIS — R102 Pelvic and perineal pain: Secondary | ICD-10-CM

## 2020-02-05 DIAGNOSIS — O2620 Pregnancy care for patient with recurrent pregnancy loss, unspecified trimester: Secondary | ICD-10-CM

## 2020-02-05 DIAGNOSIS — Z3402 Encounter for supervision of normal first pregnancy, second trimester: Secondary | ICD-10-CM

## 2020-02-05 DIAGNOSIS — O26893 Other specified pregnancy related conditions, third trimester: Secondary | ICD-10-CM

## 2020-02-05 DIAGNOSIS — Z3A29 29 weeks gestation of pregnancy: Secondary | ICD-10-CM

## 2020-02-05 DIAGNOSIS — O2623 Pregnancy care for patient with recurrent pregnancy loss, third trimester: Secondary | ICD-10-CM

## 2020-02-05 DIAGNOSIS — O99891 Other specified diseases and conditions complicating pregnancy: Secondary | ICD-10-CM

## 2020-02-05 DIAGNOSIS — M5431 Sciatica, right side: Secondary | ICD-10-CM
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
## 2020-02-19 ENCOUNTER — Ambulatory Visit: Payer: Medicaid Other | Attending: Obstetrics and Gynecology

## 2020-02-19 LAB — HIV AG/AB 4TH GENERATION: HIV Ag/Ab, 4th Generation: NONREACTIVE

## 2020-02-19 LAB — RPR
RPR: NONREACTIVE
RPR: NONREACTIVE

## 2020-02-19 LAB — ANTIBODY SCREEN: AB Screen Gel: NEGATIVE

## 2020-02-19 LAB — GONOCOCCUS CULTURE
Chlamydia trachomatis Culture: NEGATIVE
Culture Gonorrhoeae: NEGATIVE

## 2020-02-19 LAB — ABO/RH: ABO Rh: A POS

## 2020-02-19 LAB — RUBELLA ANTIBODY, IGG: Rubella AB, IgG: NON-IMMUNE/NOT IMMUNE

## 2020-02-19 LAB — HEPATITIS B SURFACE ANTIGEN W/ REFLEX TO CONFIRMATION: Hepatitis B Surface Antigen: NEGATIVE

## 2020-02-20 ENCOUNTER — Other Ambulatory Visit: Payer: Self-pay

## 2020-02-20 ENCOUNTER — Telehealth (INDEPENDENT_AMBULATORY_CARE_PROVIDER_SITE_OTHER): Payer: Medicaid Other | Admitting: Obstetrics & Gynecology

## 2020-02-20 DIAGNOSIS — Z3402 Encounter for supervision of normal first pregnancy, second trimester: Secondary | ICD-10-CM

## 2020-02-20 NOTE — Progress Notes (Signed)
Pt unable to be reached for virtual appt.

## 2020-02-20 NOTE — Progress Notes (Signed)
Called patient at [phone] regarding virtual appointment. Voice message left that patient will be recalled again in 15 minutes.   Called patient for second attempt at 1359. Voice message left for patient to reschedule appointment with [place] for a future date.    Mercy Moore, RN 02/20/2020  1:18 PM

## 2020-03-22 ENCOUNTER — Encounter: Payer: Self-pay | Admitting: Obstetrics & Gynecology

## 2020-03-27 LAB — GROUP B STREP TRANSCRIBED: GBS Transcribed: NEGATIVE

## 2020-04-08 ENCOUNTER — Encounter: Payer: Self-pay | Admitting: Obstetrics & Gynecology

## 2020-04-08 ENCOUNTER — Observation Stay
Admission: AD | Admit: 2020-04-08 | Discharge: 2020-04-08 | Disposition: A | Discharge status: Home / Self Care | Payer: BLUE CROSS/BLUE SHIELD | Source: Ambulatory Visit | Attending: Obstetrics & Gynecology | Admitting: Obstetrics & Gynecology

## 2020-04-08 DIAGNOSIS — O4290 Premature rupture of membranes, unspecified as to length of time between rupture and onset of labor, unspecified weeks of gestation: Secondary | ICD-10-CM

## 2020-04-08 DIAGNOSIS — O471 False labor at or after 37 completed weeks of gestation: Principal | ICD-10-CM | POA: Insufficient documentation

## 2020-04-08 DIAGNOSIS — Z87891 Personal history of nicotine dependence: Secondary | ICD-10-CM | POA: Insufficient documentation

## 2020-04-08 DIAGNOSIS — Z3A38 38 weeks gestation of pregnancy: Secondary | ICD-10-CM | POA: Insufficient documentation

## 2020-04-08 HISTORY — DX: Headache, unspecified: R51.9

## 2020-04-08 HISTORY — DX: Unspecified asthma, uncomplicated: J45.909

## 2020-04-08 LAB — RUPTURE OF MEMBRANE AMNISURE: Rupture of Membrane AmniSure: NEGATIVE

## 2020-04-08 MED ORDER — PROMETHAZINE HCL 12.5 MG PO TABS
12.5000 mg | ORAL_TABLET | Freq: Four times a day (QID) | ORAL | Status: DC | PRN
Start: 2020-04-08 — End: 2020-04-09

## 2020-04-08 MED ORDER — ONDANSETRON 4 MG PO TBDP
4.0000 mg | ORAL_TABLET | Freq: Three times a day (TID) | ORAL | Status: DC | PRN
Start: 2020-04-08 — End: 2020-04-09

## 2020-04-08 MED ORDER — PROMETHAZINE HCL 12.5 MG RE SUPP
12.5000 mg | Freq: Four times a day (QID) | RECTAL | Status: DC | PRN
Start: 2020-04-08 — End: 2020-04-09

## 2020-04-08 MED ORDER — ACETAMINOPHEN 325 MG PO TABS
650.0000 mg | ORAL_TABLET | ORAL | Status: DC | PRN
Start: 2020-04-08 — End: 2020-04-09

## 2020-04-08 MED ORDER — ACETAMINOPHEN 650 MG RE SUPP
650.0000 mg | RECTAL | Status: DC | PRN
Start: 2020-04-08 — End: 2020-04-09

## 2020-04-08 MED ORDER — ONDANSETRON HCL 4 MG/2ML IJ SOLN
4.0000 mg | Freq: Three times a day (TID) | INTRAMUSCULAR | Status: DC | PRN
Start: 2020-04-08 — End: 2020-04-09

## 2020-04-08 MED ORDER — SODIUM CHLORIDE 0.9 % IV SOLN
6.2500 mg | Freq: Four times a day (QID) | INTRAVENOUS | Status: DC | PRN
Start: 2020-04-08 — End: 2020-04-09

## 2020-04-08 NOTE — Progress Notes (Signed)
Discharge instructions reviewed with and given to pt.  Pt walked off unit undelivered.

## 2020-04-08 NOTE — Discharge Instr - AVS First Page (Addendum)
Reason for your Hospital Admission:  False labor      Instructions for after your discharge:  Call your doctor if:    - Contractions become stronger and regular every 5-10 minutes for one hour or   - Bag of water ruptures (sudden gush or continuous leak).  - Vaginal bleeding occurs (bright red, running down legs or pass blood clots).  Spotting   after a vaginal exam or mucous blood show is normal.  - You experience any unusually sharp abdominal pain.  - You notice a decrease in fetal movement.  - You have a persistent severe headaches.  - You have blurring of vision or spots before the eyes.  - You have nausea and vomiting.  - You have sudden increased of hands, feet, face, and ankles.   - if suddenly your rings are too tight.  - You have chills and fever over 100.4F.   - temperature not accompanied by a cold.  - You have very frequent urination or burning with urination.  - You have further concerns or questions.  - Keep your next scheduled appointment

## 2020-04-08 NOTE — Discharge Summary (Signed)
TRIAGE DISCHARGE SUMMARY    Subjective:    Angela [name], 29 y.o. G1P0 [redacted]w[redacted]d weeks gestation Estimated Date of Delivery: 04/20/20   Contractions and LOF.   Results     Procedure Component Value Units Date/Time    RUPTURE OF MEMBRANE AMNISURE [[id number]] Collected: 04/08/20 2249     Updated: 04/08/20 2310     Rupture of Membrane AmniSure Negative        SSE: negative pooling, normal white discharge.     Objective:  FHR: category I, accelerations, moderate variability, 145 bpm  Ctx: irregular   Cervix: Dilation: Closed Effacement (%): 0 Station: Ballotable   Membranes:  Intact   Assessment:  29 y.o. G1P0 @ [redacted]w[redacted]d  False labor   Intact membranes     Plan:  D/C home  Follow up prn  Keep next office visit.  Call for bleeding, leaking of fluid, contractions, decreased fetal movement, pain, fever, or any other concerns.    Signed by: Glenetta Borg, CNM 04/08/2020

## 2020-04-08 NOTE — H&P (Signed)
Triage H&P    10/12/202111:37 PM           Angela [name], 29 y.o. G1P0 [redacted]w[redacted]d weeks gestation EDD 04/20/2020, by Patient Reported  presents to labor and delivery c/o Rupture of membranes: maybe for 3 days and Uterine contractions: increasing in frequency for 2 days. Patient denies vaginal bleeding. Available prenatal care records have been reviewed.    SSE: negative pooling. White discharge noted.   The cervix is posterior and high.     We discussed true labor vs false labor, fck, lof, bleeding and when to call.     OB History   Gravida Para Term Preterm AB Living   1             SAB TAB Ectopic Multiple Live Births                  # Outcome Date GA Lbr Len/2nd Weight Sex Delivery Anes PTL Lv   1 Current              Past Medical History:   Diagnosis Date    Asthma     Headache      Past Surgical History:   Procedure Laterality Date    COLPOSCOPY  2018    TONSILLECTOMY  2019     No Known Allergies  Social History     Tobacco Use    Smoking status: Former Smoker     Years: 2.00     Types: Cigars     Quit date: 2019     Years since quitting: 2.7    Smokeless tobacco: Never Used   Substance Use Topics    Alcohol use: Not Currently    Drug use: Never     Scheduled Meds:  Current Facility-Administered Medications   Medication Dose Route Frequency     General Appearance: Alert, appropriate appearance for age. No acute distress, HEENT Exam: Grossly normal., Chest/Respiratory Exam: Normal chest wall and respirations., Gastrointestinal Exam: Soft, non-tender, Pelvic Exam Female: Vulva and vagina appear normal. , Skin: no rash or abnormalities, Neurologic Exam: Normal gait and speech, no tremor. and Psychiatric Exam: Alert and oriented, appropriate affect.    EFW of 7 to 7 1/2  pounds by abdominal palpation. Cephalic by Leoplod's manuevers.    FHR:  145, accelerations, moderate variability,  FHR Category: Category I    Ctx: Contraction Frequency: irreg    Cervix: Dilation: Closed Effacement (%): 0 Station:  Ballotable          Results     Procedure Component Value Units Date/Time    RUPTURE OF MEMBRANE AMNISURE [[id number]] Collected: 04/08/20 2249     Updated: 04/08/20 2310     Rupture of Membrane AmniSure Negative         Pregnancy complicated by:  Late transfer of care     Assessment:  [redacted]w[redacted]d weeks gestation  Fetal heart tracing:  FHR Category: Category I  False labor   Intact membranes     Plan:  NST  discharge to home with instructions to f/u as schedule and precations on when to call or come in for evaluation  amnisure   Call your doctor if:    - Contractions become stronger and regular every 5-10 minutes for one hour or   - Bag of water ruptures (sudden gush or continuous leak).  - Vaginal bleeding occurs (bright red, running down legs or pass blood clots).  Spotting   after a vaginal exam or mucous blood show is  normal.  - You experience any unusually sharp abdominal pain.  - You notice a decrease in fetal movement.  - You have a persistent severe headaches.  - You have blurring of vision or spots before the eyes.  - You have nausea and vomiting.  - You have sudden increased of hands, feet, face, and ankles.   - if suddenly your rings are too tight.  - You have chills and fever over 100.7F.   - temperature not accompanied by a cold.  - You have very frequent urination or burning with urination.  - You have further concerns or questions.  - Keep your next scheduled appointment    Glenetta Borg, CNM  04/08/2020 11:37 PM

## 2020-04-09 ENCOUNTER — Encounter: Payer: Self-pay | Admitting: Obstetrics & Gynecology

## 2020-04-15 IMAGING — US US OB < 14 WEEKS - US OB TV
1 series · 15 of 28 positions shown · non-contrast
Comparison: November 19, 2016.

CLINICAL DATA: Abdominal cramping obstructing pregnancy.

EXAM:
OBSTETRIC <14 WK US AND TRANSVAGINAL OB US
TECHNIQUE: Both transabdominal and transvaginal ultrasound examinations were
performed for complete evaluation of the gestation as well as the
maternal uterus, adnexal regions, and pelvic cul-de-sac.
Transvaginal technique was performed to assess early pregnancy.

[Series 1: us ob < 14 weeks - us ob tv · 15 of 29 slices shown]
[im 1/29]
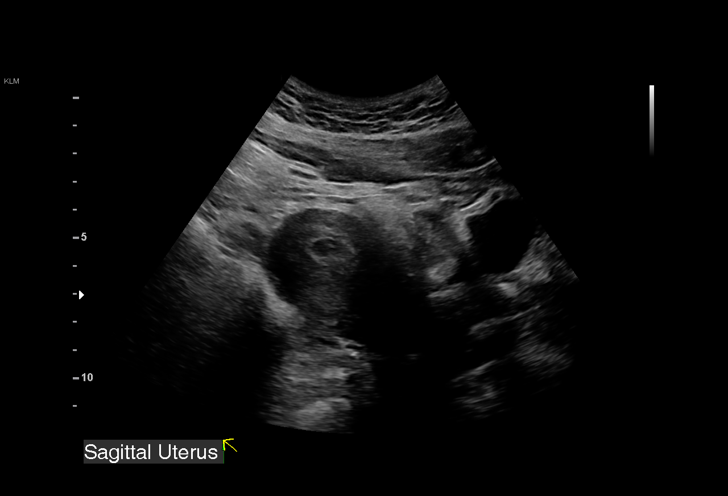
[im 3/29]
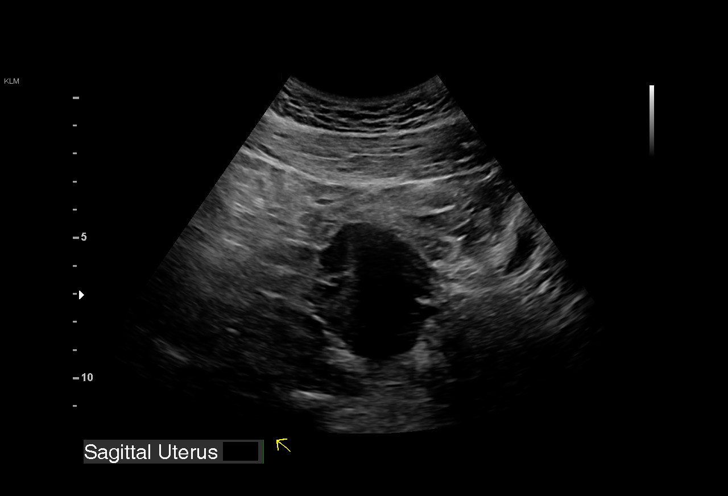
[im 5/29]
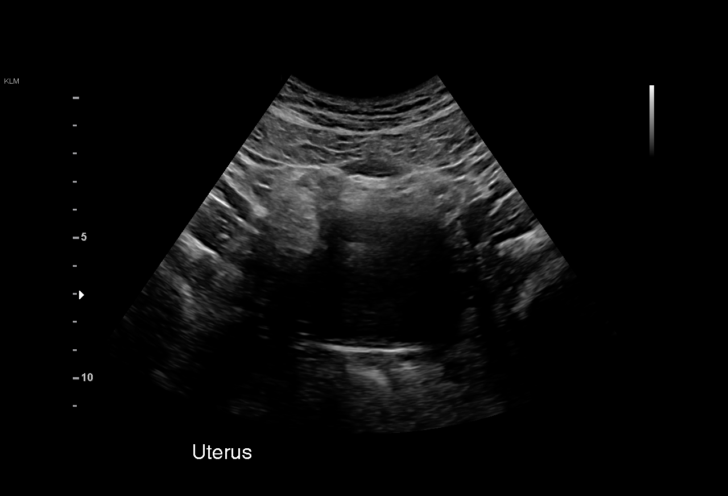
[im 7/29]
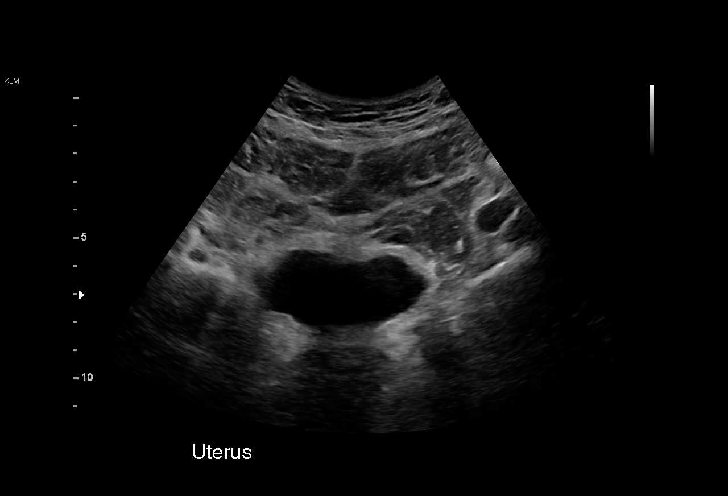
[im 9/29]
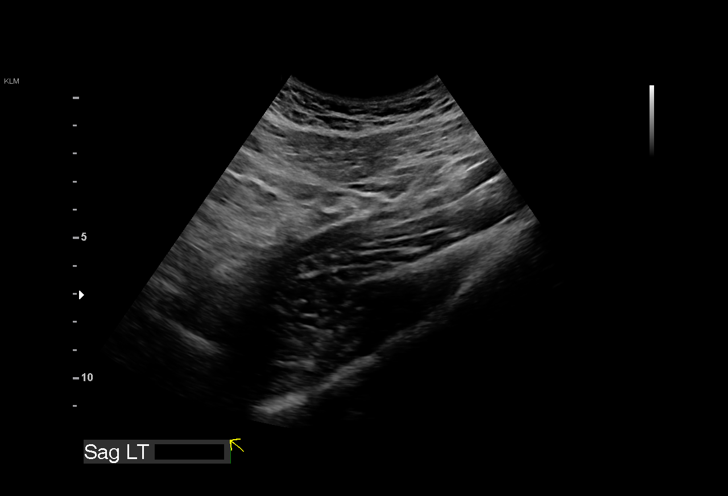
[im 11/29]
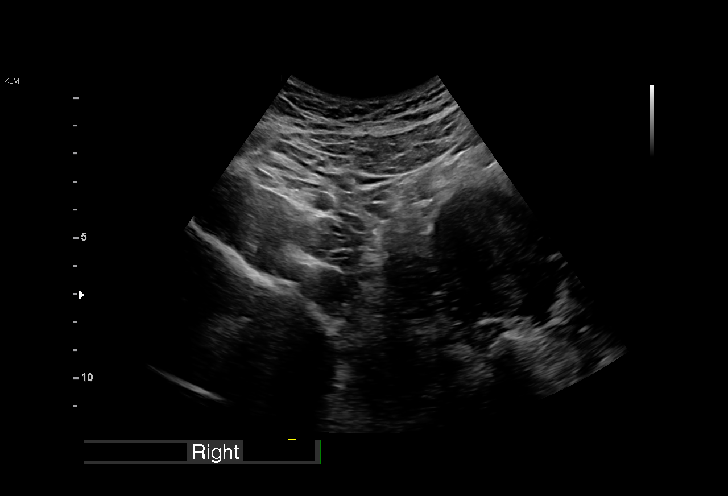
[im 13/29]
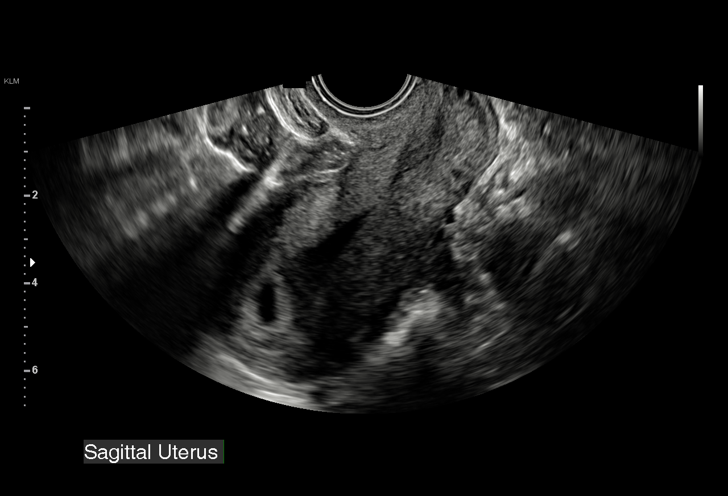
[im 15/29]
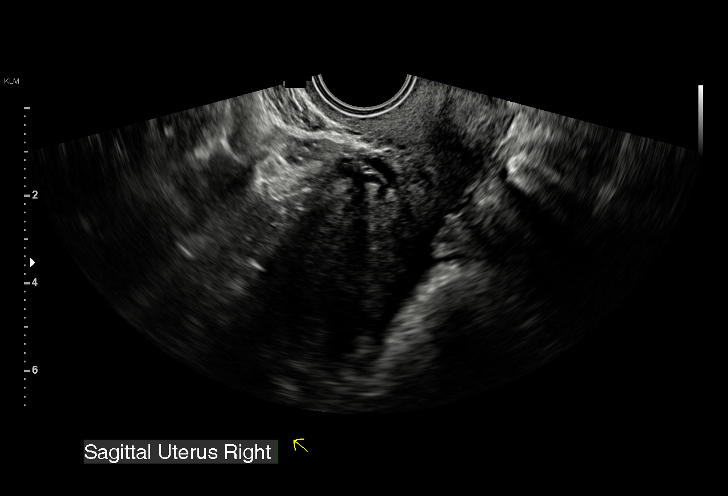
[im 16/29]
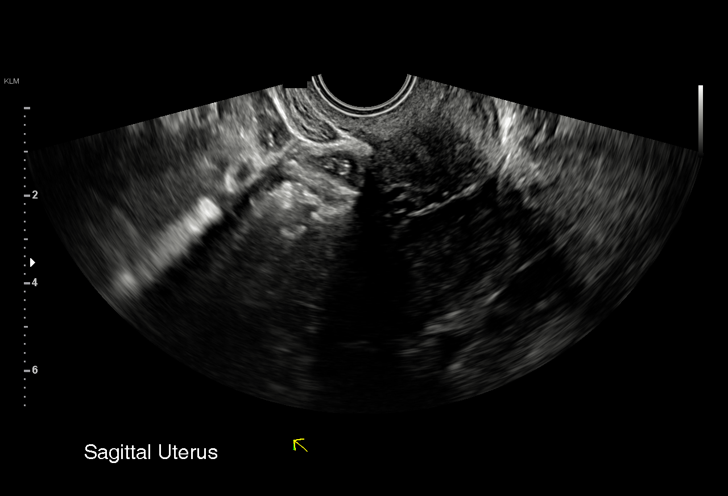
[im 18/29]
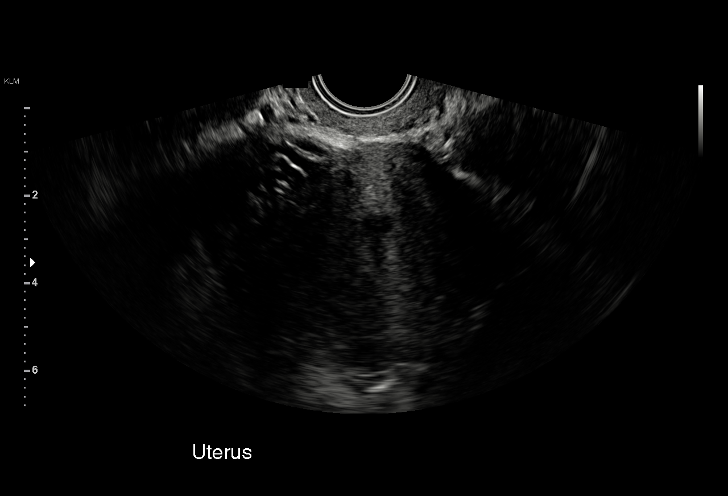
[im 20/29]
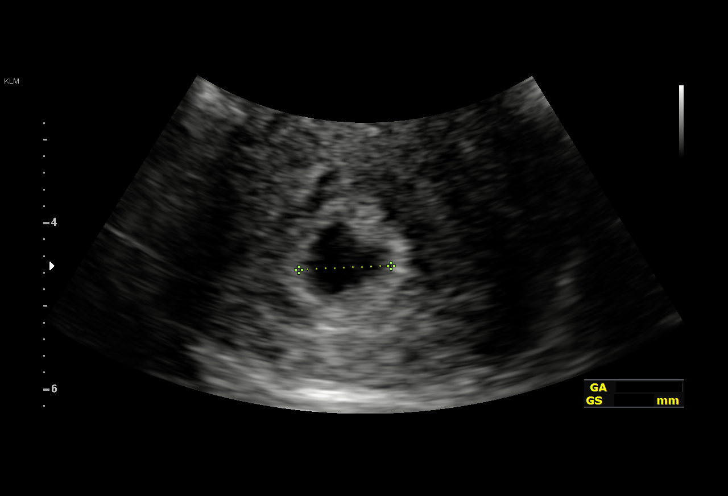
[im 22/29]
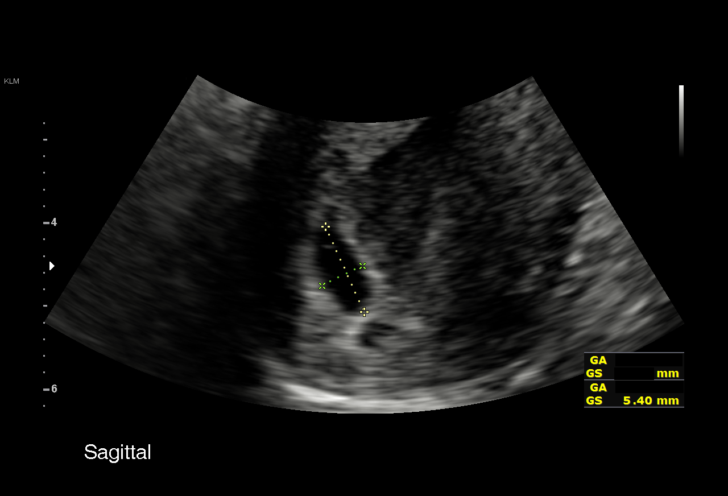
[im 24/29]
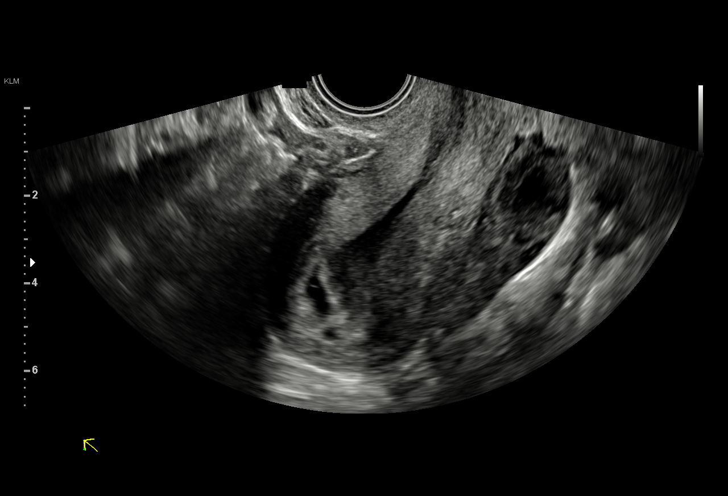
[im 26/29]
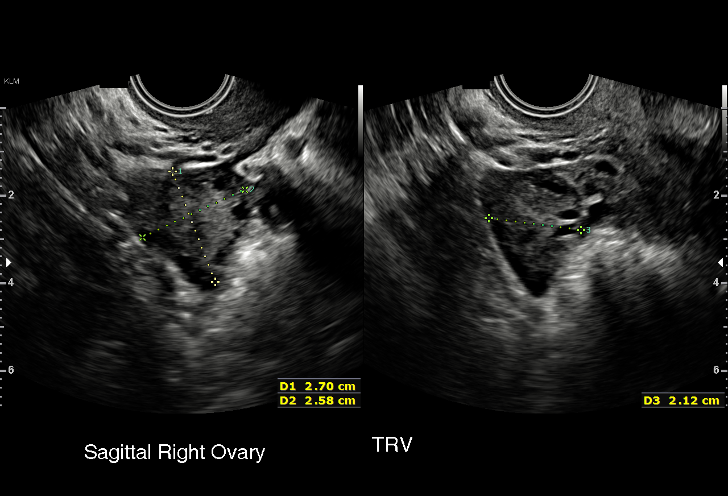
[im 29/29]
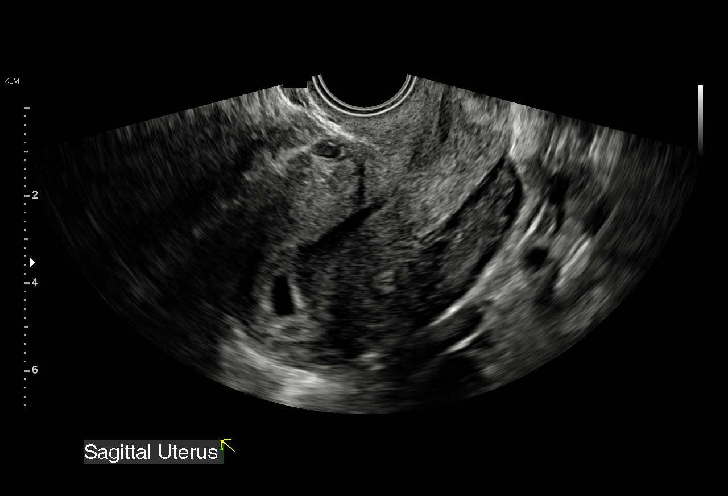

[15 of 28 positions shown; findings below may reference images not displayed]

FINDINGS: Intrauterine gestational sac: Single

Yolk sac:  Not Visualized.

Embryo:  Not Visualized.

Cardiac Activity: Not Visualized.

MSD: 9.24 mm   5 w   5 d

Subchorionic hemorrhage:  None visualized.

Maternal uterus/adnexae: Ovaries are unremarkable. No free fluid is
noted.
IMPRESSION: Probable early intrauterine gestational sac, but no yolk sac, fetal
pole, or cardiac activity yet visualized. Recommend follow-up
quantitative B-HCG levels and follow-up US in 14 days to assess
viability. This recommendation follows SRU consensus guidelines:
Diagnostic Criteria for Nonviable Pregnancy Early in the First
Trimester. N Engl J Med 3237; [DATE].

## 2020-04-17 ENCOUNTER — Encounter: Payer: Self-pay | Admitting: Obstetrics & Gynecology

## 2020-04-17 ENCOUNTER — Inpatient Hospital Stay
Admission: RE | Admit: 2020-04-17 | Discharge: 2020-04-20 | DRG: 807 | Disposition: A | Discharge status: Home / Self Care | Payer: BLUE CROSS/BLUE SHIELD | Attending: Obstetrics & Gynecology | Admitting: Obstetrics & Gynecology

## 2020-04-17 DIAGNOSIS — Z3A39 39 weeks gestation of pregnancy: Secondary | ICD-10-CM

## 2020-04-17 DIAGNOSIS — J45909 Unspecified asthma, uncomplicated: Secondary | ICD-10-CM | POA: Diagnosis present

## 2020-04-17 DIAGNOSIS — Z79899 Other long term (current) drug therapy: Secondary | ICD-10-CM

## 2020-04-17 DIAGNOSIS — Z3490 Encounter for supervision of normal pregnancy, unspecified, unspecified trimester: Secondary | ICD-10-CM

## 2020-04-17 DIAGNOSIS — O9952 Diseases of the respiratory system complicating childbirth: Secondary | ICD-10-CM | POA: Diagnosis present

## 2020-04-17 DIAGNOSIS — Z23 Encounter for immunization: Secondary | ICD-10-CM

## 2020-04-17 DIAGNOSIS — O99214 Obesity complicating childbirth: Principal | ICD-10-CM | POA: Diagnosis present

## 2020-04-17 DIAGNOSIS — O1425 HELLP syndrome, complicating the puerperium: Secondary | ICD-10-CM | POA: Diagnosis not present

## 2020-04-17 MED ORDER — ACETAMINOPHEN 650 MG RE SUPP
650.0000 mg | RECTAL | Status: DC | PRN
Start: 2020-04-17 — End: 2020-04-19

## 2020-04-17 MED ORDER — ACETAMINOPHEN 325 MG PO TABS
650.0000 mg | ORAL_TABLET | ORAL | Status: DC | PRN
Start: 2020-04-17 — End: 2020-04-19
  Administered 2020-04-18 – 2020-04-19 (×2): 650 mg via ORAL
  Filled 2020-04-17 (×2): qty 2

## 2020-04-17 MED ORDER — PROMETHAZINE HCL 12.5 MG PO TABS
12.5000 mg | ORAL_TABLET | Freq: Four times a day (QID) | ORAL | Status: DC | PRN
Start: 2020-04-17 — End: 2020-04-19

## 2020-04-17 MED ORDER — ONDANSETRON 4 MG PO TBDP
4.0000 mg | ORAL_TABLET | Freq: Three times a day (TID) | ORAL | Status: DC | PRN
Start: 2020-04-17 — End: 2020-04-18

## 2020-04-17 MED ORDER — SODIUM CHLORIDE 0.9 % IV SOLN
6.2500 mg | Freq: Four times a day (QID) | INTRAVENOUS | Status: DC | PRN
Start: 2020-04-17 — End: 2020-04-19

## 2020-04-17 MED ORDER — ONDANSETRON HCL 4 MG/2ML IJ SOLN
4.0000 mg | Freq: Three times a day (TID) | INTRAMUSCULAR | Status: DC | PRN
Start: 2020-04-17 — End: 2020-04-18

## 2020-04-17 MED ORDER — PROMETHAZINE HCL 12.5 MG RE SUPP
12.5000 mg | Freq: Four times a day (QID) | RECTAL | Status: DC | PRN
Start: 2020-04-17 — End: 2020-04-19

## 2020-04-17 NOTE — H&P (Signed)
04/17/2020 11:22 PM     Angela [name] is a 29 y.o. year old female who presents for strong contractions and SROM at 2100 for clear fluid.   Regular contractions x 60 minutes.   Desires NCB     History     Problems with present pregnancy: BMI >40.    OB History:   OB History     Gravida   4    Para        Term        Preterm        AB   3    Living           SAB   3    IAB        Ectopic        Multiple        Live Births                   Allergies: Apple, Banana, Peanut (diagnostic), Pear fruit [pear], and Plum pulp    Current medications: No current facility-administered medications for this encounter.    Gestational age: [redacted]w[redacted]d    Previous Pregnancy Problems: x3 abortions     Other significant prenatal, medical, or family history:    Past Medical History:   Diagnosis Date    Asthma     Headache      Past Surgical History:   Procedure Laterality Date    COLPOSCOPY  2018    TONSILLECTOMY  2019       Prenatal labs:  Lab Results   Component Value Date    ABORH A Pos 02/19/2020    HEPBSAG Negative 02/19/2020    GBS Negative 03/27/2020    RPR Nonreactive 02/19/2020    RUBELLAABIGG Non-Immune 02/19/2020       Physician/CNM admission/Physical Examination     Vital Signs: There were no vitals filed for this visit.    General Appearance: Alert, appropriate appearance for age. No acute distress  HEENT Exam: Grossly normal.  Gastrointestinal Exam: Soft, non-tender, no masses or organomegaly.  Pelvic Exam Female: Vulva and vagina appear normal. Bimanual exam reveals normal uterus and adnexa. Clinical staff offered to be present for exam  Skin: no rash or abnormalities  Abdomen soft and nontender    Membranes: ruptured, clear     Cervical Exam:  Dilation: 4  Effacement (%): 100  Station: -2  Presentation: Vertex  OB Examiner: Lynwood Kubisiak    140, moderate variability, present accels, no decels.   CTX q2-3 minutes.   Pt breathing through ctx and swaying     Proposed Plan of Care   Admit for labor and SROM.   Desires  NCB   D/t to floor census, in triage at this time.     Satira Sark, CNM, CNM   04/17/2020 11:22 PM

## 2020-04-17 NOTE — Progress Notes (Addendum)
2100 SROM.- clear   Ctx q2-3  For last hour     VE: 4/100/-2   Patient desires NCB. Breathing well through contractions.     Rayvon Char, CNM

## 2020-04-18 ENCOUNTER — Encounter: Payer: Self-pay | Admitting: Obstetrics & Gynecology

## 2020-04-18 LAB — CBC AND DIFFERENTIAL
Absolute NRBC: 0 10*3/uL (ref 0.00–0.00)
Absolute NRBC: 0 10*3/uL (ref 0.00–0.00)
Basophils Absolute Automated: 0.03 10*3/uL (ref 0.00–0.08)
Basophils Absolute Automated: 0.04 10*3/uL (ref 0.00–0.08)
Basophils Automated: 0.3 %
Basophils Automated: 0.3 %
Eosinophils Absolute Automated: 0.03 10*3/uL (ref 0.00–0.44)
Eosinophils Absolute Automated: 0.03 10*3/uL (ref 0.00–0.44)
Eosinophils Automated: 0.2 %
Eosinophils Automated: 0.3 %
Hematocrit: 32.3 % — ABNORMAL LOW (ref 34.7–43.7)
Hematocrit: 38.5 % (ref 34.7–43.7)
Hgb: 10.7 g/dL — ABNORMAL LOW (ref 11.4–14.8)
Hgb: 12.8 g/dL (ref 11.4–14.8)
Immature Granulocytes Absolute: 0.04 10*3/uL (ref 0.00–0.07)
Immature Granulocytes Absolute: 0.09 10*3/uL — ABNORMAL HIGH (ref 0.00–0.07)
Immature Granulocytes: 0.5 %
Immature Granulocytes: 0.6 %
Lymphocytes Absolute Automated: 2.75 10*3/uL (ref 0.42–3.22)
Lymphocytes Absolute Automated: 3.18 10*3/uL (ref 0.42–3.22)
Lymphocytes Automated: 21.3 %
Lymphocytes Automated: 31.8 %
MCH: 31 pg (ref 25.1–33.5)
MCH: 31.4 pg (ref 25.1–33.5)
MCHC: 33.1 g/dL (ref 31.5–35.8)
MCHC: 33.2 g/dL (ref 31.5–35.8)
MCV: 93.2 fL (ref 78.0–96.0)
MCV: 94.7 fL (ref 78.0–96.0)
MPV: 10.1 fL (ref 8.9–12.5)
MPV: 9.7 fL (ref 8.9–12.5)
Monocytes Absolute Automated: 0.7 10*3/uL (ref 0.21–0.85)
Monocytes Absolute Automated: 1.62 10*3/uL — ABNORMAL HIGH (ref 0.21–0.85)
Monocytes: 10.8 %
Monocytes: 8.1 %
Neutrophils Absolute: 5.1 10*3/uL (ref 1.10–6.33)
Neutrophils Absolute: 9.98 10*3/uL — ABNORMAL HIGH (ref 1.10–6.33)
Neutrophils: 59 %
Neutrophils: 66.8 %
Nucleated RBC: 0 /100 WBC (ref 0.0–0.0)
Nucleated RBC: 0 /100 WBC (ref 0.0–0.0)
Platelets: 260 10*3/uL (ref 142–346)
Platelets: 362 10*3/uL — ABNORMAL HIGH (ref 142–346)
RBC: 3.41 10*6/uL — ABNORMAL LOW (ref 3.90–5.10)
RBC: 4.13 10*6/uL (ref 3.90–5.10)
RDW: 14 % (ref 11–15)
RDW: 14 % (ref 11–15)
WBC: 14.94 10*3/uL — ABNORMAL HIGH (ref 3.10–9.50)
WBC: 8.65 10*3/uL (ref 3.10–9.50)

## 2020-04-18 LAB — COMPREHENSIVE METABOLIC PANEL
ALT: 268 U/L — ABNORMAL HIGH (ref 0–55)
ALT: 245 U/L — ABNORMAL HIGH (ref 0–55)
AST (SGOT): 161 U/L — ABNORMAL HIGH (ref 5–34)
AST (SGOT): 141 U/L — ABNORMAL HIGH (ref 5–34)
Albumin/Globulin Ratio: 0.8 — ABNORMAL LOW (ref 0.9–2.2)
Albumin/Globulin Ratio: 0.8 — ABNORMAL LOW (ref 0.9–2.2)
Albumin: 2.7 g/dL — ABNORMAL LOW (ref 3.5–5.0)
Albumin: 2.7 g/dL — ABNORMAL LOW (ref 3.5–5.0)
Alkaline Phosphatase: 233 U/L — ABNORMAL HIGH (ref 37–106)
Alkaline Phosphatase: 232 U/L — ABNORMAL HIGH (ref 37–106)
Anion Gap: 9 (ref 5.0–15.0)
Anion Gap: 11 (ref 5.0–15.0)
BUN: 6 mg/dL — ABNORMAL LOW (ref 7.0–19.0)
BUN: 5 mg/dL — ABNORMAL LOW (ref 7.0–19.0)
Bilirubin, Total: 0.5 mg/dL (ref 0.2–1.2)
Bilirubin, Total: 0.6 mg/dL (ref 0.2–1.2)
CO2: 18 mEq/L — ABNORMAL LOW (ref 22–29)
CO2: 19 mEq/L — ABNORMAL LOW (ref 22–29)
Calcium: 8.3 mg/dL — ABNORMAL LOW (ref 8.5–10.5)
Calcium: 8.2 mg/dL — ABNORMAL LOW (ref 8.5–10.5)
Chloride: 108 mEq/L (ref 100–111)
Chloride: 106 mEq/L (ref 100–111)
Creatinine: 0.9 mg/dL (ref 0.6–1.0)
Creatinine: 1 mg/dL (ref 0.6–1.0)
Globulin: 3.4 g/dL (ref 2.0–3.6)
Globulin: 3.6 g/dL (ref 2.0–3.6)
Glucose: 85 mg/dL (ref 70–100)
Glucose: 80 mg/dL (ref 70–100)
Potassium: 4.5 mEq/L (ref 3.5–5.1)
Potassium: 4.3 mEq/L (ref 3.5–5.1)
Protein, Total: 6.1 g/dL (ref 6.0–8.3)
Protein, Total: 6.3 g/dL (ref 6.0–8.3)
Sodium: 135 mEq/L — ABNORMAL LOW (ref 136–145)
Sodium: 136 mEq/L (ref 136–145)

## 2020-04-18 LAB — TYPE AND SCREEN
AB Screen Gel: NEGATIVE
ABO Rh: A POS

## 2020-04-18 LAB — PREGNANCY INDUCED HYPERTENSION PANEL
ALT: 305 U/L — ABNORMAL HIGH (ref 0–55)
AST (SGOT): 183 U/L — ABNORMAL HIGH (ref 5–34)
Creatinine: 1.1 mg/dL — ABNORMAL HIGH (ref 0.6–1.0)
LDH: 352 U/L — ABNORMAL HIGH (ref 125–331)
Uric acid: 6.8 mg/dL — ABNORMAL HIGH (ref 2.6–6.0)

## 2020-04-18 LAB — GFR
EGFR: >60
EGFR: >60
EGFR: >60

## 2020-04-18 LAB — PROTEIN / CREATININE RATIO, URINE
Urine Creatinine, Random: 115.8 mg/dL
Urine Protein Random: 30.6 mg/dL — ABNORMAL HIGH (ref 1.0–14.0)
Urine Protein/Creatinine Ratio: 0.3

## 2020-04-18 LAB — L&D MAGNESIUM CRITICAL RESULT CALLED FOR LEVEL GREATER THAN 10 MG/DL
L&D Magnesium: 5.5 mg/dL — ABNORMAL HIGH (ref 1.7–2.2)
L&D Magnesium: 6.2 mg/dL — ABNORMAL HIGH (ref 1.7–2.2)

## 2020-04-18 LAB — HEMOLYSIS INDEX
Hemolysis Index: -2 — ABNORMAL LOW (ref 0–18)
Hemolysis Index: 3 (ref 0–18)
Hemolysis Index: 13 (ref 0–18)

## 2020-04-18 LAB — ABO/RH: ABO Rh: A POS

## 2020-04-18 MED ORDER — OXYCODONE HCL 5 MG PO TABS
5.0000 mg | ORAL_TABLET | Freq: Once | ORAL | Status: DC | PRN
Start: 2020-04-18 — End: 2020-04-19

## 2020-04-18 MED ORDER — MINERAL OIL PO OIL
TOPICAL_OIL | ORAL | Status: AC
Start: 2020-04-18 — End: 2020-04-18
  Filled 2020-04-18: qty 30

## 2020-04-18 MED ORDER — IBUPROFEN 400 MG PO TABS
800.0000 mg | ORAL_TABLET | Freq: Three times a day (TID) | ORAL | Status: DC
Start: 2020-04-18 — End: 2020-04-20
  Administered 2020-04-18 – 2020-04-20 (×6): 800 mg via ORAL
  Filled 2020-04-18 (×6): qty 2

## 2020-04-18 MED ORDER — MAGNESIUM SULFATE 40 GM/1000ML IV SOLN
2.0000 g/h | INTRAVENOUS | Status: DC
Start: 2020-04-18 — End: 2020-04-20
  Administered 2020-04-18 (×3): 2 g/h via INTRAVENOUS
  Filled 2020-04-18: qty 1000

## 2020-04-18 MED ORDER — OXYTOCIN-SODIUM CHLORIDE 30-0.9 UT/500ML-% IV SOLN
INTRAVENOUS | Status: AC
Start: 2020-04-18 — End: 2020-04-18
  Administered 2020-04-18: 01:00:00 30 [IU]
  Filled 2020-04-18: qty 500

## 2020-04-18 MED ORDER — TRANEXAMIC ACID-NACL 1000-0.7 MG/100ML-% IV SOLN
INTRAVENOUS | Status: AC
Start: 2020-04-18 — End: 2020-04-18
  Administered 2020-04-18: 02:00:00 1000 mg
  Filled 2020-04-18: qty 100

## 2020-04-18 MED ORDER — ONDANSETRON HCL 4 MG/2ML IJ SOLN
4.0000 mg | Freq: Three times a day (TID) | INTRAMUSCULAR | Status: DC | PRN
Start: 2020-04-18 — End: 2020-04-19

## 2020-04-18 MED ORDER — VITAMIN K1 1 MG/0.5ML IJ SOLN
INTRAMUSCULAR | Status: AC
Start: 2020-04-18 — End: 2020-04-18
  Filled 2020-04-18: qty 0.5

## 2020-04-18 MED ORDER — OXYTOCIN 10 UNIT/ML IJ SOLN
INTRAMUSCULAR | Status: DC
Start: 2020-04-18 — End: 2020-04-18
  Filled 2020-04-18: qty 1

## 2020-04-18 MED ORDER — LIDOCAINE HCL 2 % IJ SOLN
INTRAMUSCULAR | Status: AC
Start: 2020-04-18 — End: 2020-04-18
  Administered 2020-04-18: 01:00:00 400 mg
  Filled 2020-04-18: qty 20

## 2020-04-18 MED ORDER — METHYLERGONOVINE MALEATE 0.2 MG/ML IJ SOLN
0.2000 mg | INTRAMUSCULAR | Status: DC | PRN
Start: 2020-04-18 — End: 2020-04-19

## 2020-04-18 MED ORDER — ONDANSETRON 4 MG PO TBDP
4.0000 mg | ORAL_TABLET | Freq: Three times a day (TID) | ORAL | Status: DC | PRN
Start: 2020-04-18 — End: 2020-04-19

## 2020-04-18 MED ORDER — LIDOCAINE(URO-JET) 2% JELLY (WRAP)
CUTANEOUS | Status: DC | PRN
Start: 2020-04-18 — End: 2020-04-19
  Filled 2020-04-18: qty 10

## 2020-04-18 MED ORDER — MAGNESIUM SULFATE 40 GM/1000 ML BOLUS FROM BAG
4.0000 g | Freq: Once | INTRAVENOUS | Status: AC
Start: 2020-04-18 — End: 2020-04-18
  Administered 2020-04-18: 04:00:00 4 g via INTRAVENOUS
  Filled 2020-04-18: qty 1000

## 2020-04-18 MED ORDER — MISOPROSTOL 200 MCG PO TABS
ORAL_TABLET | ORAL | Status: DC
Start: 2020-04-18 — End: 2020-04-18
  Filled 2020-04-18: qty 1

## 2020-04-18 MED ORDER — CALCIUM GLUCONATE 10 % IV SOLN
1.0000 g | INTRAVENOUS | Status: DC | PRN
Start: 2020-04-18 — End: 2020-04-20

## 2020-04-18 MED ORDER — HYDRALAZINE HCL 20 MG/ML IJ SOLN
10.0000 mg | Freq: Once | INTRAMUSCULAR | Status: DC
Start: 2020-04-18 — End: 2020-04-20

## 2020-04-18 MED ORDER — IBUPROFEN 400 MG PO TABS
800.0000 mg | ORAL_TABLET | Freq: Once | ORAL | Status: AC | PRN
Start: 2020-04-18 — End: 2020-04-18
  Administered 2020-04-18: 03:00:00 800 mg via ORAL
  Filled 2020-04-18 (×2): qty 2

## 2020-04-18 MED ORDER — METHYLERGONOVINE MALEATE 0.2 MG/ML IJ SOLN
INTRAMUSCULAR | Status: DC
Start: 2020-04-18 — End: 2020-04-18
  Filled 2020-04-18: qty 1

## 2020-04-18 MED ORDER — NALOXONE HCL 0.4 MG/ML IJ SOLN (WRAP)
0.2000 mg | INTRAMUSCULAR | Status: DC | PRN
Start: 2020-04-18 — End: 2020-04-19

## 2020-04-18 MED ORDER — LACTATED RINGERS IV SOLN
INTRAVENOUS | Status: DC
Start: 2020-04-18 — End: 2020-04-20

## 2020-04-18 MED ORDER — MISOPROSTOL 200 MCG PO TABS
800.0000 ug | ORAL_TABLET | Freq: Once | ORAL | Status: DC | PRN
Start: 2020-04-18 — End: 2020-04-19

## 2020-04-18 NOTE — Progress Notes (Signed)
Patient reported in CDU for labor evaluation , G4P0, 39.4 weeks. Evalauted by Barnabas Lister CNM, cervix 4/100/-2, admitted for delivery . DIfficulty monitoring fetus due to maternal position and movements. Care of patient handed off to Lynann Bologna. RN

## 2020-04-18 NOTE — Progress Notes (Signed)
10/22/20212:15 PM    Patient without complaints. No preeclampsia symptoms.   thin lochia    BP 120/66    Pulse 81    Temp 97.6 F (36.4 C) (Oral)    Resp 16    Ht 1.626 m (5\' 4" )    Wt 113.4 kg (250 lb)    SpO2 100%    Breastfeeding Unknown    BMI 42.91 kg/m     Abd: Soft, NT, ND, fundus firm    Recent Labs   Lab 04/17/20  2352   WBC 8.65   Hgb 12.8   Hematocrit 38.5   Platelets 362*     Results for Angela [name], Angela [name] (MRN [id number]) as of 04/18/2020 14:16   Ref. Range 04/18/2020 11:03   Glucose Latest Ref Range: 70 - 100 mg/dL 85   BUN Latest Ref Range: 7.0 - 19.0 mg/dL 6.0 (L)   Creatinine Latest Ref Range: 0.6 - 1.0 mg/dL 0.9   Sodium Latest Ref Range: 136 - 145 mEq/L 135 (L)   Potassium Latest Ref Range: 3.5 - 5.1 mEq/L 4.5   Chloride Latest Ref Range: 100 - 111 mEq/L 108   CO2 Latest Ref Range: 22 - 29 mEq/L 18 (L)   Calcium Latest Ref Range: 8.5 - 10.5 mg/dL 8.3 (L)   Anion Gap Latest Ref Range: 5.0 - 15.0  9.0   EGFR Unknown >60.0   AST Latest Ref Range: 5 - 34 U/L 161 (H)   ALT Latest Ref Range: 0 - 55 U/L 268 (H)   Alkaline Phosphatase Latest Ref Range: 37 - 106 U/L 233 (H)   Albumin Latest Ref Range: 3.5 - 5.0 g/dL 2.7 (L)   Protein Total Latest Ref Range: 6.0 - 8.3 g/dL 6.1   Globulin Latest Ref Range: 2.0 - 3.6 g/dL 3.4   Albumin/Globulin Ratio Latest Ref Range: 0.9 - 2.2  0.8 (L)   Bilirubin Total Latest Ref Range: 0.2 - 1.2 mg/dL 0.5   L&D Magnesium Latest Ref Range: 1.7 - 2.2 mg/dL 5.5 (H)       Assessment: Postpartum Day 0 with HELLP on Magnesium for 24 hours.      Plan:  Continue Magnesium care.  Monitor blood pressures and symptoms.  Continue postpartum care and pain management.

## 2020-04-18 NOTE — Progress Notes (Signed)
Report given to Volanda Napoleon RN.  Care relinquished.

## 2020-04-18 NOTE — Discharge Instr - AVS First Page (Addendum)
After Your Delivery Discharge Instructions    After Discharge Orders:    - You were diagnosed with post partum pre-eclampsia  and put on magnesium sulfate. Watch out for warning signs including strong headache not relieved with Tylenol, epigastric right sided abdomen pain, vision changes/spots to your vision. : Call the midwife on call asap or go to the ED.   - Take your blood pressure at home,  same time twice a day and document the blood pressures.     FU in 3-5 days in the office for a blood pressure check.   FU in 2 weeks for a blood pressure check.   Follow up in 6 weeks: Sooner if desires for in office or televisit.     Medications:   Ibuprofen/Motrin 600-800mg  every 8 hours   Tylenol 975-1000mg  every 6-8 hours   Stool softener/ Colace 100mg  Daily      After your delivery - signs and symptoms to watch for:  Fever - Oral temperature greater than 100.4 degrees Fahrenheit  Foul-smelling vaginal discharge  Headache unrelieved by "pain meds"  Difficulty urinating  Breasts reddened, hard, hot to the touch  Nipple discharge which is foul-smelling or contains pus  Increased pain at the site of the laceration  Difficulty breathing with or without chest pain  New calf pain especially if only on one side  Sudden, continuing increased vaginal bleeding with or without clots  Unrelieved feelings of:  Inability to cope  Sadness  Anxiety  Lack of interest in baby  Insomnia  Crying     What to do at home:  See patient education handouts for full information  Resume activity gradually   Don't lift anything heavier than baby and carrier until OK'd by your Physician or Midwife  No sex until OK'd by your Physician or Midwife  Take care of yourself by sleeping/resting as much as possible  Eat regular nutritious meals  Let someone else care for you, your baby, and housework as much as possible   Take pain medication as prescribed whenever you need them  Wear compression stockings if prescribed   To avoid/relieve constipation take  stool softeners if advised   Drink lots of water/fruit juices  Increase fiber in your diet  Breast care: Wear support bra 24/7; use Lanolin as needed     Refer to Newborn Discharge Instructions for any concerns

## 2020-04-18 NOTE — Progress Notes (Addendum)
Patient SVD @ 0123; NCB   Denies PIH signs/symptoms. Denies any health problems. Only pregnancy complication was Obesity >40 BMI.   -PCR 0.3, Elevated creatinine, LFTs.    Reviewed findings with Dr Debby Bud.   To start Magnesium Sulfate 4 gram bolus/2 gram maintenance on LND     Jodee Wagenaar, CNM      BP Temp Pulse Resp   04/18/20 0330 148/80  (!) 106    04/18/20 0315 151/86  (!) 105    04/18/20 0300 (!) 163/91  (!) 104    04/18/20 0245 (!) 160/98  (!) 106    04/18/20 0202 (!) 132/93 98.4 F (36.9 C) (!) 118 18   04/18/20 0200 (!) 132/93  (!) 118    04/17/20 2327 141/87  (!) 115      Results     Procedure Component Value Units Date/Time    RPR [[id number]] Resulted: 02/19/20    Specimen: Blood Updated: 04/18/20 0405     RPR Nonreactive    HIV Ag/Ab 4th generation [[id number]] Resulted: 02/19/20     Updated: 04/18/20 0405     HIV Ag/Ab, 4th Generation non-reactive    Protein / creatinine ratio, urine [[id number]]  (Abnormal) Collected: 04/18/20 0252    Specimen: Urine Updated: 04/18/20 0355     Urine Protein Random 30.6 mg/dL      Urine Creatinine, Random 115.8 mg/dL      Urine Protein/Creatinine Ratio 0.3    Narrative:      PIH Panel Consist of: ALT, AST LDH, Uric Acid, and Creatinine    Pregnancy Induced Hypertension Panel [[id number]]  (Abnormal) Collected: 04/18/20 0252    Specimen: Blood Updated: 04/18/20 0351     ALT 305 U/L      AST (SGOT) 183 U/L      Uric acid 6.8 mg/dL      LDH 440 U/L      Creatinine 1.1 mg/dL     Narrative:      PIH Panel Consist of: ALT, AST LDH, Uric Acid, and Creatinine    Hemolysis index [[id number]]  (Abnormal) Collected: 04/18/20 0252     Updated: 04/18/20 0351     Hemolysis Index -2    Narrative:      PIH Panel Consist of: ALT, AST LDH, Uric Acid, and Creatinine    GFR [[id number]] Collected: 04/18/20 0252     Updated: 04/18/20 0351     EGFR >60.0    Narrative:      PIH Panel Consist of: ALT, AST LDH, Uric Acid, and Creatinine    ABO/Rh [[id number]] Collected: 04/18/20 0046      Updated: 04/18/20 0134     ABO Rh A POS    Type and Screen [[id number]] Collected: 04/17/20 2352    Specimen: Blood Updated: 04/18/20 0054     ABO Rh A POS     AB Screen Gel NEG    CBC and differential [[id number]]  (Abnormal) Collected: 04/17/20 2352    Specimen: Blood Updated: 04/18/20 0007     WBC 8.65 x10 3/uL      Hgb 12.8 g/dL      Hematocrit 01.0 %      Platelets 362 x10 3/uL      RBC 4.13 x10 6/uL      MCV 93.2 fL      MCH 31.0 pg      MCHC 33.2 g/dL      RDW 14 %      MPV 10.1 fL      Neutrophils  59.0 %      Lymphocytes Automated 31.8 %      Monocytes 8.1 %      Eosinophils Automated 0.3 %      Basophils Automated 0.3 %      Immature Granulocytes 0.5 %      Nucleated RBC 0.0 /100 WBC      Neutrophils Absolute 5.10 x10 3/uL      Lymphocytes Absolute Automated 2.75 x10 3/uL      Monocytes Absolute Automated 0.70 x10 3/uL      Eosinophils Absolute Automated 0.03 x10 3/uL      Basophils Absolute Automated 0.03 x10 3/uL      Immature Granulocytes Absolute 0.04 x10 3/uL      Absolute NRBC 0.00 x10 3/uL

## 2020-04-18 NOTE — Progress Notes (Signed)
Report given to vivian, rn; care of pt released

## 2020-04-18 NOTE — Plan of Care (Signed)
Pt aox4; vitals stable; receiving magnesium therapy for preeclampsia, infant and husband at bedside.

## 2020-04-18 NOTE — Plan of Care (Signed)
Problem: Safety  Goal: Patient will be free from injury during hospitalization  Outcome: Progressing     Problem: Pain  Goal: Pain at adequate level as identified by patient  Outcome: Progressing     Problem: Moderate/High Fall Risk Score >5  Goal: Patient will remain free of falls  Outcome: Progressing

## 2020-04-19 LAB — L&D MAGNESIUM CRITICAL RESULT CALLED FOR LEVEL GREATER THAN 10 MG/DL: L&D Magnesium: 6.5 mg/dL — ABNORMAL HIGH (ref 1.7–2.2)

## 2020-04-19 MED ORDER — BENZOCAINE 20% +/- MENTHOL 0.5% EX AERO (WRAP)
1.0000 | INHALATION_SPRAY | CUTANEOUS | Status: DC | PRN
Start: 2020-04-19 — End: 2020-04-20
  Administered 2020-04-19: 07:00:00 1 via TOPICAL
  Filled 2020-04-19: qty 85

## 2020-04-19 MED ORDER — SENNOSIDES-DOCUSATE SODIUM 8.6-50 MG PO TABS
1.0000 | ORAL_TABLET | Freq: Every evening | ORAL | Status: DC | PRN
Start: 2020-04-19 — End: 2020-04-20
  Administered 2020-04-20: 02:00:00 1 via ORAL
  Filled 2020-04-19: qty 1

## 2020-04-19 MED ORDER — MISOPROSTOL 200 MCG PO TABS
800.0000 ug | ORAL_TABLET | Freq: Once | ORAL | Status: DC | PRN
Start: 2020-04-19 — End: 2020-04-20

## 2020-04-19 MED ORDER — NIFEDIPINE 10 MG PO CAPS
20.0000 mg | ORAL_CAPSULE | Freq: Once | ORAL | Status: DC | PRN
Start: 2020-04-19 — End: 2020-04-20

## 2020-04-19 MED ORDER — AMMONIA AROMATIC IN INHA
1.0000 | Freq: Once | RESPIRATORY_TRACT | Status: DC | PRN
Start: 2020-04-19 — End: 2020-04-20

## 2020-04-19 MED ORDER — HYDROCORTISONE 1 % EX OINT
TOPICAL_OINTMENT | Freq: Three times a day (TID) | CUTANEOUS | Status: DC | PRN
Start: 2020-04-19 — End: 2020-04-20
  Filled 2020-04-19: qty 28.4

## 2020-04-19 MED ORDER — MAGNESIUM HYDROXIDE 400 MG/5ML PO SUSP
30.0000 mL | Freq: Four times a day (QID) | ORAL | Status: DC | PRN
Start: 2020-04-19 — End: 2020-04-20

## 2020-04-19 MED ORDER — METHYLERGONOVINE MALEATE 0.2 MG/ML IJ SOLN
0.2000 mg | Freq: Four times a day (QID) | INTRAMUSCULAR | Status: DC | PRN
Start: 2020-04-19 — End: 2020-04-20

## 2020-04-19 MED ORDER — ALBUTEROL SULFATE (2.5 MG/3ML) 0.083% IN NEBU
2.5000 mg | INHALATION_SOLUTION | Freq: Four times a day (QID) | RESPIRATORY_TRACT | Status: DC | PRN
Start: 2020-04-19 — End: 2020-04-20

## 2020-04-19 MED ORDER — WITCH HAZEL EX PADS (WRAP)
1.0000 | MEDICATED_PAD | CUTANEOUS | Status: DC | PRN
Start: 2020-04-19 — End: 2020-04-20
  Administered 2020-04-19: 07:00:00 1 via TOPICAL
  Filled 2020-04-19: qty 40

## 2020-04-19 MED ORDER — PRENATAL PLUS IRON 29-1 MG PO TABS
1.0000 | ORAL_TABLET | Freq: Every day | ORAL | Status: DC
Start: 2020-04-19 — End: 2020-04-20
  Administered 2020-04-19 – 2020-04-20 (×2): 1 via ORAL
  Filled 2020-04-19 (×2): qty 1

## 2020-04-19 MED ORDER — BISACODYL 10 MG RE SUPP
10.0000 mg | Freq: Every day | RECTAL | Status: DC | PRN
Start: 2020-04-19 — End: 2020-04-20

## 2020-04-19 MED ORDER — TETANUS-DIPHTH-ACELL PERTUSSIS 5-2.5-18.5 LF-MCG/0.5 IM SUSP
0.5000 mL | INTRAMUSCULAR | Status: DC | PRN
Start: 2020-04-19 — End: 2020-04-20

## 2020-04-19 MED ORDER — DOCUSATE SODIUM 100 MG PO CAPS
100.0000 mg | ORAL_CAPSULE | Freq: Every day | ORAL | Status: DC
Start: 2020-04-19 — End: 2020-04-20
  Administered 2020-04-19 – 2020-04-20 (×2): 100 mg via ORAL
  Filled 2020-04-19 (×2): qty 1

## 2020-04-19 MED ORDER — MEASLES, MUMPS & RUBELLA VAC IJ SOLR
0.5000 mL | INTRAMUSCULAR | Status: AC | PRN
Start: 2020-04-19 — End: 2020-04-20
  Administered 2020-04-20: 13:00:00 0.5 mL via SUBCUTANEOUS
  Filled 2020-04-19: qty 0.5

## 2020-04-19 MED ORDER — NALOXONE HCL 0.4 MG/ML IJ SOLN (WRAP)
0.2000 mg | INTRAMUSCULAR | Status: DC | PRN
Start: 2020-04-19 — End: 2020-04-20

## 2020-04-19 MED ORDER — ONDANSETRON 4 MG PO TBDP
4.0000 mg | ORAL_TABLET | Freq: Three times a day (TID) | ORAL | Status: DC | PRN
Start: 2020-04-19 — End: 2020-04-20

## 2020-04-19 MED ORDER — SIMETHICONE 80 MG PO CHEW
80.0000 mg | CHEWABLE_TABLET | Freq: Four times a day (QID) | ORAL | Status: DC | PRN
Start: 2020-04-19 — End: 2020-04-20

## 2020-04-19 MED ORDER — LANOLIN EX OINT
TOPICAL_OINTMENT | CUTANEOUS | Status: DC | PRN
Start: 2020-04-19 — End: 2020-04-20
  Filled 2020-04-19: qty 7

## 2020-04-19 MED ORDER — OXYCODONE HCL 5 MG PO TABS
5.0000 mg | ORAL_TABLET | ORAL | Status: DC | PRN
Start: 2020-04-19 — End: 2020-04-20

## 2020-04-19 MED ORDER — ONDANSETRON HCL 4 MG/2ML IJ SOLN
4.0000 mg | Freq: Three times a day (TID) | INTRAMUSCULAR | Status: DC | PRN
Start: 2020-04-19 — End: 2020-04-20

## 2020-04-19 MED ORDER — NIFEDIPINE 10 MG PO CAPS
10.0000 mg | ORAL_CAPSULE | Freq: Once | ORAL | Status: DC | PRN
Start: 2020-04-19 — End: 2020-04-20

## 2020-04-19 MED ORDER — ACETAMINOPHEN 650 MG RE SUPP
650.0000 mg | RECTAL | Status: DC | PRN
Start: 2020-04-19 — End: 2020-04-20

## 2020-04-19 MED ORDER — ACETAMINOPHEN 500 MG PO TABS
1000.0000 mg | ORAL_TABLET | Freq: Four times a day (QID) | ORAL | Status: DC | PRN
Start: 2020-04-19 — End: 2020-04-20

## 2020-04-19 MED ORDER — METHYLERGONOVINE MALEATE 0.2 MG PO TABS
0.2000 mg | ORAL_TABLET | Freq: Four times a day (QID) | ORAL | Status: DC | PRN
Start: 2020-04-19 — End: 2020-04-20

## 2020-04-19 NOTE — Plan of Care (Signed)
Problem: Vaginal/Cesarean Delivery  Goal: Maternal Status within defined parameters  Outcome: Progressing  Flowsheets (Taken 04/19/2020 2202)  Maternal status with defined parameters:   Monitor/assess vital signs   Perform physical assessment per phase of care  Goal: Postpartum management of pain/discomfort  Outcome: Progressing  Flowsheets (Taken 04/19/2020 2202)  Postpartum management of pain/discomfort:   Assess pain using a consistent, developmental/age appropriate pain scale   Assess pain level before and following intervention   Include patient/patient care companion in decisions related to pain management  Goal: Perineum will be clean, dry, and intact and without discharge or hematoma  Outcome: Progressing  Flowsheets (Taken 04/19/2020 2202)  Perineum will be clean, dry, and intact and without discharge or hematoma:   Place cold pack on perineum   Provide pericare   Sitz bath PRN as ordered by LIP  Goal: Evidence of positive mother-baby interactions  Outcome: Progressing  Flowsheets (Taken 04/19/2020 2202)  Evidence of positive mother-baby interactions:   Include patient/patient care companion in decisions related to care   Assess emotional status and coping mechanisms   Initiate skin to skin   Initiate safety and falls prevention interventions     Pt stable at this time, no complaints of headaches, visual disturbances or dizziness.  Pt ambulatory and independent with peri-care, reports controlled pain. Encouraged to increase oral fluid intake, hand expression and breastfeeding; instructed to call if needing assistance. POC discussed and questions answered. Will continue to monitor.

## 2020-04-19 NOTE — Progress Notes (Signed)
Report given to oncoming RN. Care relinquished.

## 2020-04-19 NOTE — Plan of Care (Signed)
Pt resting comfortable in her room. Pt nursing at this time.

## 2020-04-19 NOTE — Progress Notes (Signed)
10/23/202110:33 AM    Patient without complaints.  thin lochia No preeclampsia symptoms.     BP 115/77    Pulse 94    Temp 98.4 F (36.9 C) (Oral)    Resp 18    Ht 1.626 m (5\' 4" )    Wt 113.4 kg (250 lb)    SpO2 99%    Breastfeeding Unknown    BMI 42.91 kg/m     Abd: Soft, NT, ND, fundus firm    Recent Labs   Lab 04/18/20  1715 04/17/20  2352   WBC 14.94* 8.65   Hgb 10.7* 12.8   Hematocrit 32.3* 38.5   Platelets 260 362*       Assessment: Postpartum Day 1 with HELLP s/p Magnesium for 24 hours. Blood pressures within normal limits without ambulating. Patient will ambulate to see blood pressure require management    Plan:  Continue postpartum care and pain management.  Monitor pressures and determine if antihypertensive needed.

## 2020-04-19 NOTE — Consults (Signed)
Visited pt at the bedside P1, to offer lactation support. Pt reports positive breast growth during pregnancy. No history of breast surgery, no medical concerns. Newborn had 1 wet 1 stool diapers in first 24 hrs of life.  Educated pt about newborn output requirements for days of life. Offered to assist with position and latch,pt decline at this time. She reports that newborn has been latching and breastfeeding well without concerns. Encouraged pt to breastfeed newborn 8 or more time sin 24 hrs, offer both breast at each feed. Pt verbalized understanding. Updated white board with lactation contact information. All questions and concerns addressed, pt to call prn. Payton Spark RN

## 2020-04-19 NOTE — Plan of Care (Signed)
Problem: Safety  Goal: Patient will be free from infection during hospitalization  Outcome: Progressing  Flowsheets (Taken 04/19/2020 0552)  Free from Infection during hospitalization:   Assess and monitor for signs and symptoms of infection   Monitor lab/diagnostic results   Monitor all insertion sites (i.e. indwelling lines, tubes, urinary catheters, and drains)   Encourage patient and family to use good hand hygiene technique     Problem: Pain  Goal: Pain at adequate level as identified by patient  Flowsheets (Taken 04/19/2020 0552)  Pain at adequate level as identified by patient:   Identify patient comfort function goal   Assess pain on admission, during daily assessment and/or before any "as needed" intervention(s)     Patient admitted to [facility] unit at 0530. Patient instructed to call nurse using call light or phone in room if she needs assistance. Patient verbalizes understanding. Patient condition stable. Vital signs within normal limits. Fundus firm at u/u with scant bleeding. Patient reminded to call nurse if she experiences heavy bleeding or clotting. Plan of care discussed, including need to void x2 with assistance. Will continue to monitor.

## 2020-04-20 MED ORDER — DSS 100 MG PO CAPS
100.0000 mg | ORAL_CAPSULE | Freq: Every day | ORAL | Status: AC
Start: 2020-04-21 — End: ?

## 2020-04-20 MED ORDER — HYDROCORTISONE 1 % EX OINT
TOPICAL_OINTMENT | Freq: Three times a day (TID) | CUTANEOUS | Status: AC | PRN
Start: 2020-04-20 — End: ?

## 2020-04-20 MED ORDER — IBUPROFEN 800 MG PO TABS
800.0000 mg | ORAL_TABLET | Freq: Three times a day (TID) | ORAL | Status: DC
Start: 2020-04-20 — End: 2022-09-29

## 2020-04-20 MED ORDER — LANOLIN EX OINT
TOPICAL_OINTMENT | CUTANEOUS | Status: AC | PRN
Start: 2020-04-20 — End: ?

## 2020-04-20 MED ORDER — SENNOSIDES-DOCUSATE SODIUM 8.6-50 MG PO TABS
1.0000 | ORAL_TABLET | Freq: Every evening | ORAL | Status: AC | PRN
Start: 2020-04-20 — End: ?

## 2020-04-20 NOTE — Discharge Summary (Signed)
[facility] POSTPARTUM DISCHARGE SUMMARY    Subjective:  Delivery Type: Vaginal s/p IOL d/t prelabor ROM  Postpartum course complicated by HELLP syndrome, given PPMgSO4 x 24 hours; no BP meds ordered at discharge.  Minimal Bleeding, breastfeeding well  Baby doing well  Ready for discharge today    Objective:  Vital Signs Stable;   Vitals:    04/20/20 1206   BP: 125/81   Pulse: 70   Resp: 18   Temp: 99 F (37.2 C)   SpO2: 99%     Breast soft, non tender;   Fundus firm, non tender;  Extremities WNL    Assessment/Plan:  Stable - Postpartum Day 2  Discharge home  Follow up in 3-5 days for BP check and again in 2 weeks and again at 6 weeks  Call with fever, increasing and severe pain, heavy bleeding, depression, any questions or concerns  Take Ibuprofen PRN  PNV daily  Written and verbal instructions given    Signed by: Golda Acre, CNM 04/20/2020

## 2020-04-20 NOTE — Plan of Care (Signed)
Discharge teaching was given to parents, well understood. Pt is ready for discharge.

## 2020-04-21 IMAGING — US US OB TRANSVAGINAL
1 series · 15 of 28 positions shown · non-contrast
Comparison: None.

CLINICAL DATA: Abdominal pain and pregnancy

EXAM:
OBSTETRIC <14 WK US AND TRANSVAGINAL OB US
TECHNIQUE: Both transabdominal and transvaginal ultrasound examinations were
performed for complete evaluation of the gestation as well as the
maternal uterus, adnexal regions, and pelvic cul-de-sac.
Transvaginal technique was performed to assess early pregnancy.

[Series 1: us ob transvaginal · 15 of 35 slices shown]
[im 1/35]
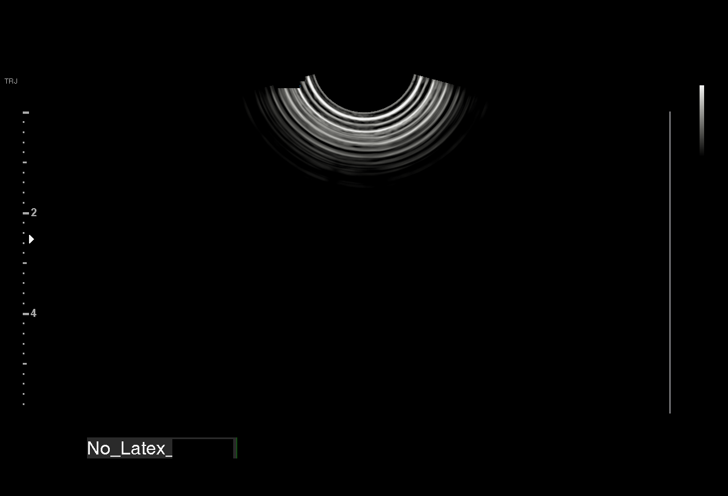
[im 3/35]
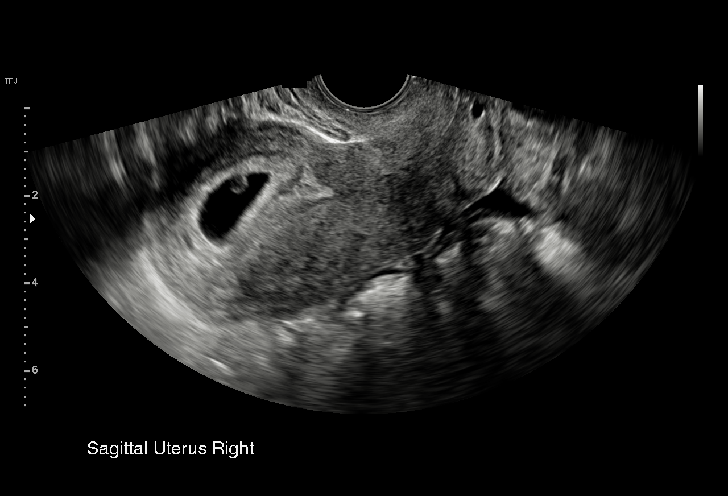
[im 6/35]
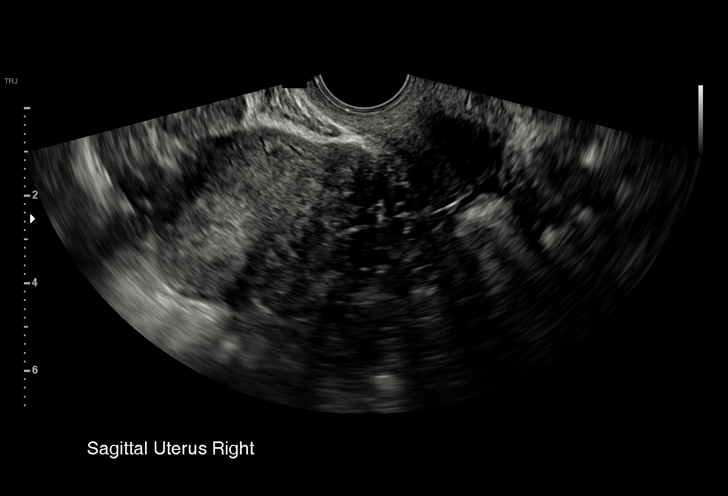
[im 8/35]
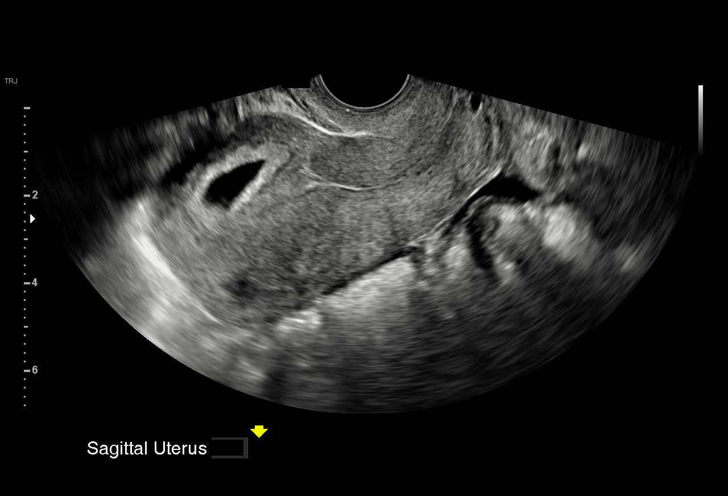
[im 11/35]
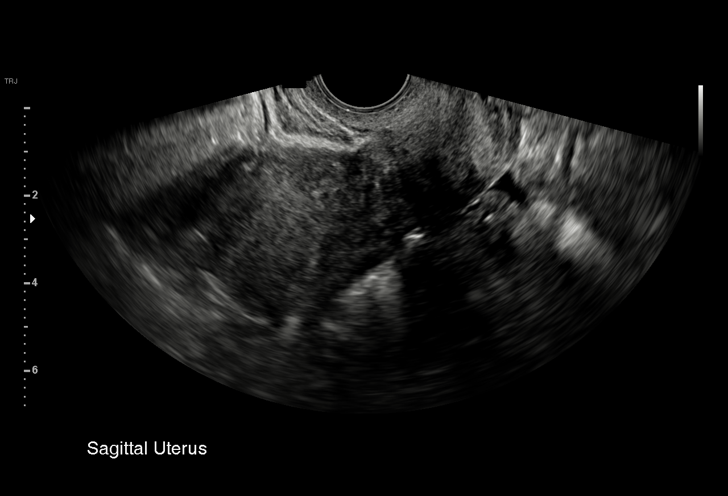
[im 13/35]
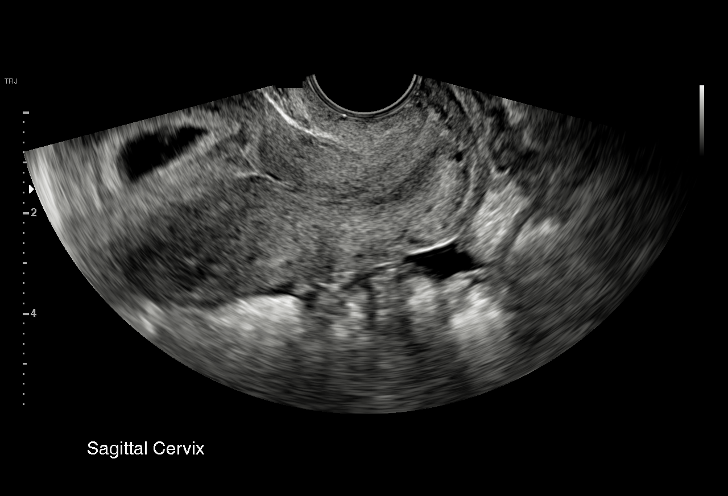
[im 16/35]
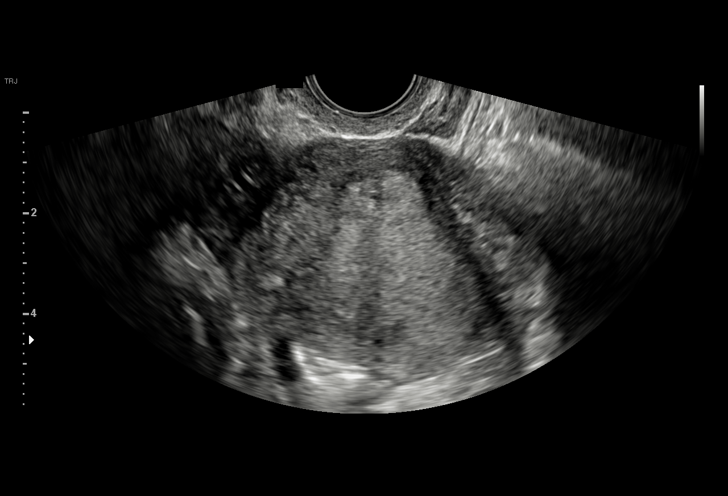
[im 18/35]
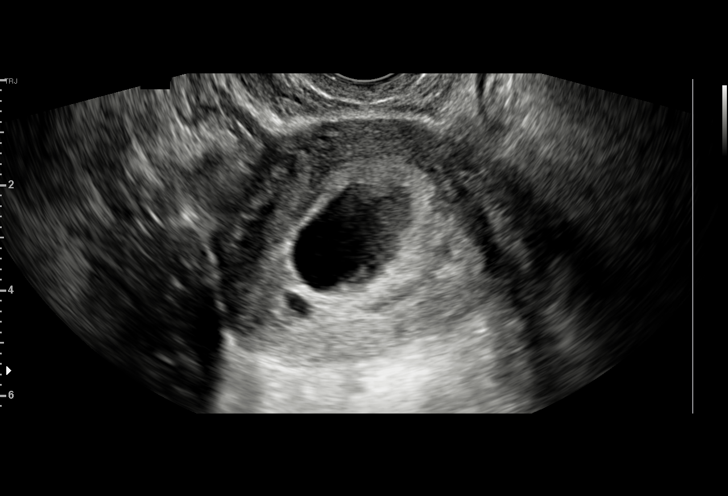
[im 19/35]
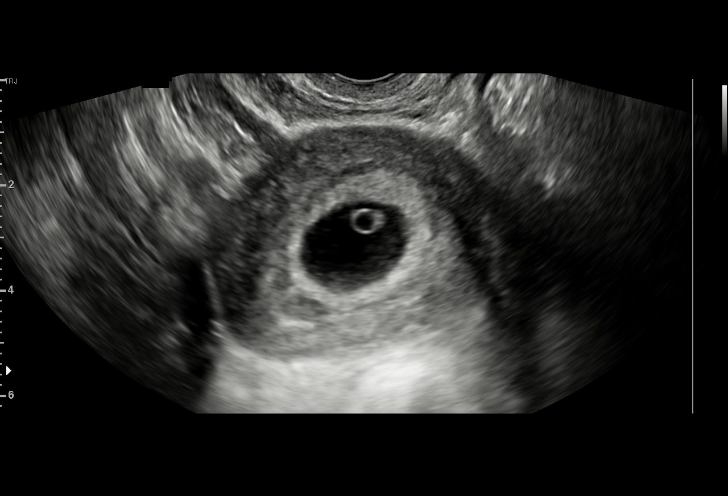
[im 22/35]
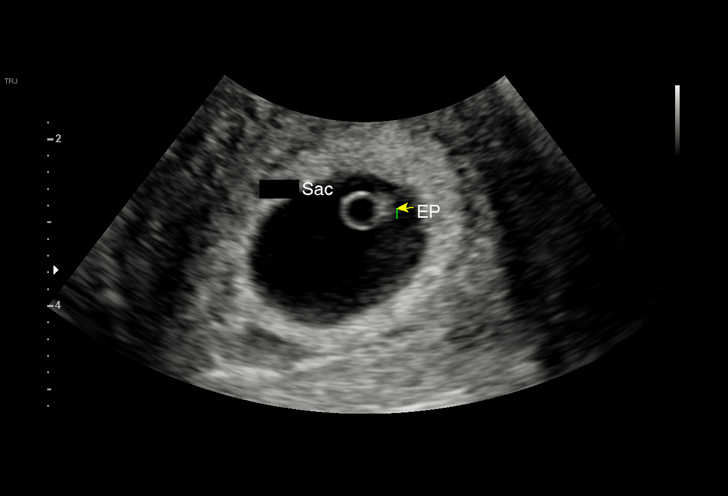
[im 24/35]
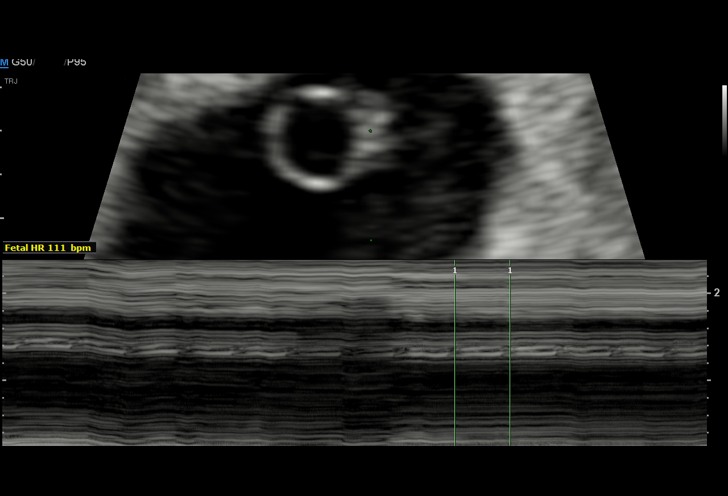
[im 27/35]
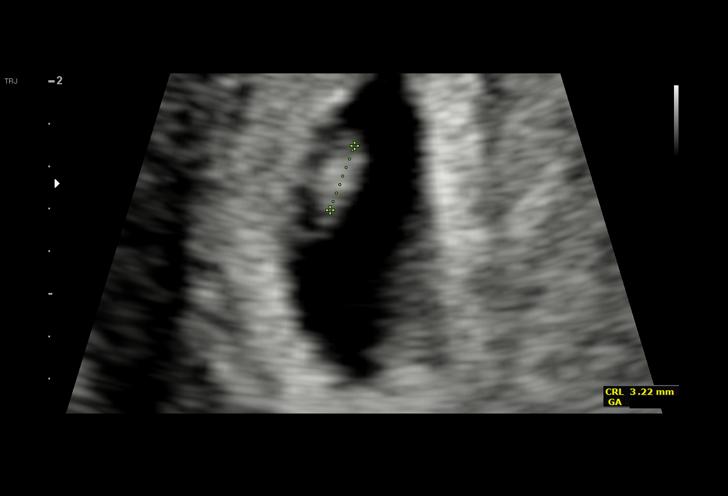
[im 29/35]
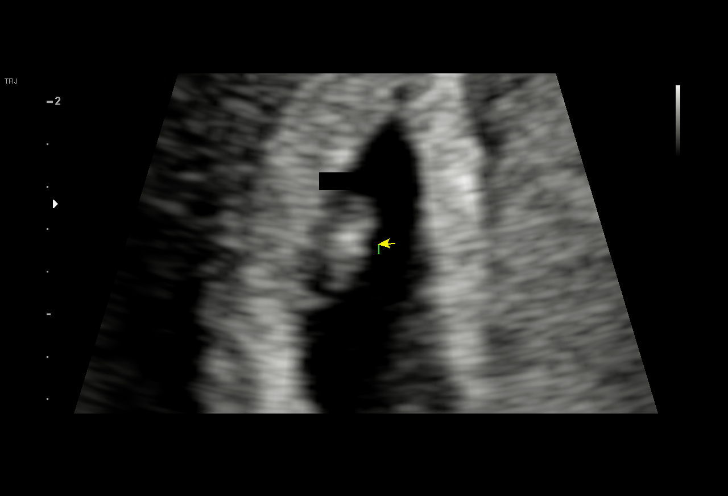
[im 32/35]
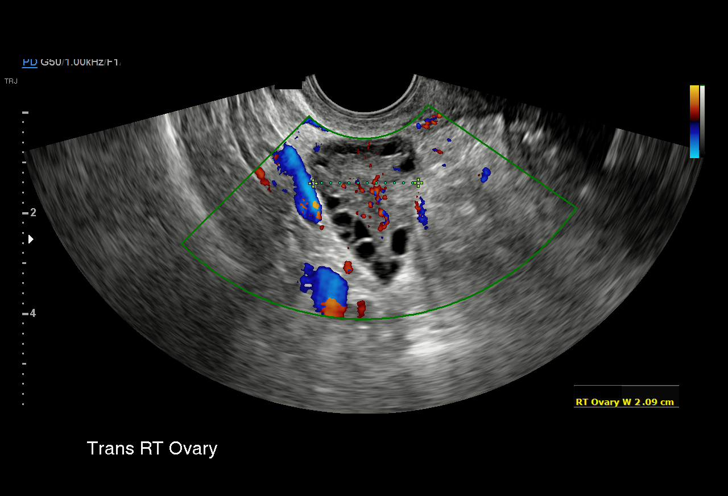
[im 35/35]
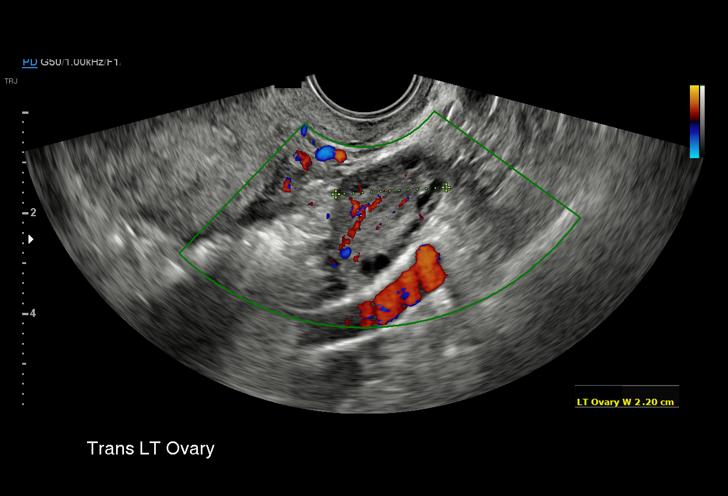

[15 of 28 positions shown; findings below may reference images not displayed]

FINDINGS: Intrauterine gestational sac: Present

Yolk sac:  Present

Embryo:  Present

Cardiac Activity: Present

Heart Rate: 111 bpm

CRL:  3.2 mm   5 w   6 d                  US EDC: 04/25/2020

Subchorionic hemorrhage:  None visualized.

Maternal uterus/adnexae: Corpus luteum of the LEFT ovary. Normal
RIGHT ovary. No free fluid
IMPRESSION: 1. Single intrauterine gestation with embryo and normal cardiac
activity.

2. Estimated gestational age by crown rump length equals 5 weeks 6
days.

## 2020-07-19 IMAGING — US US MFM OB DETAIL+14 WK
1 series · 13 of 28 positions shown · non-contrast
Comparison: none

[Series 1: us mfm ob detail+14 wk · 13 of 116 slices shown]
[im 5/116]
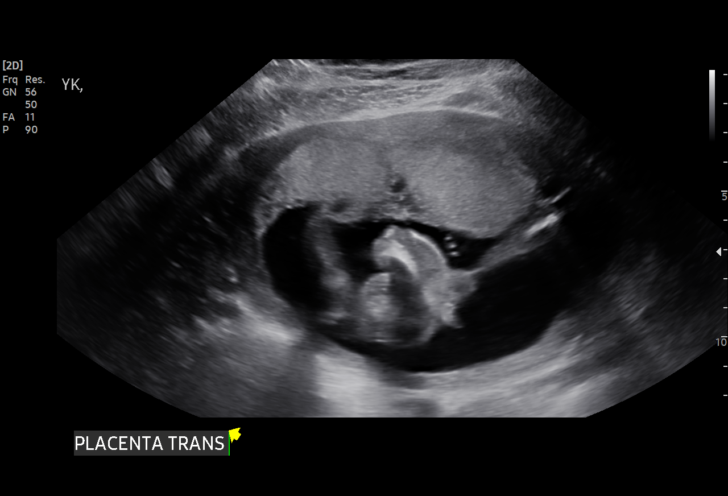
[im 13/116]
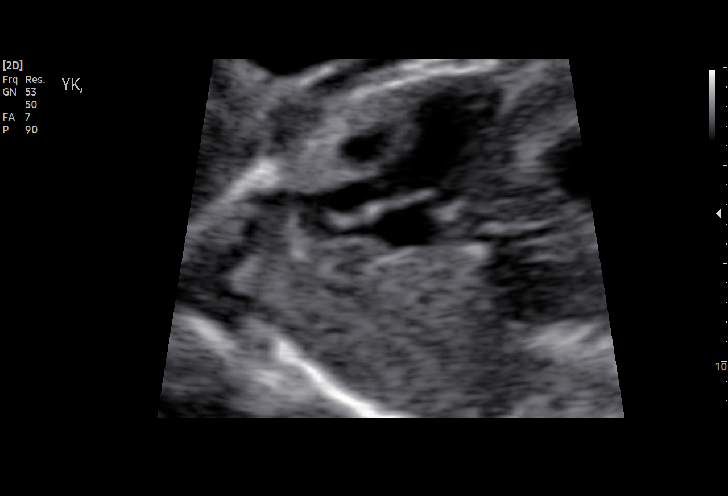
[im 22/116]
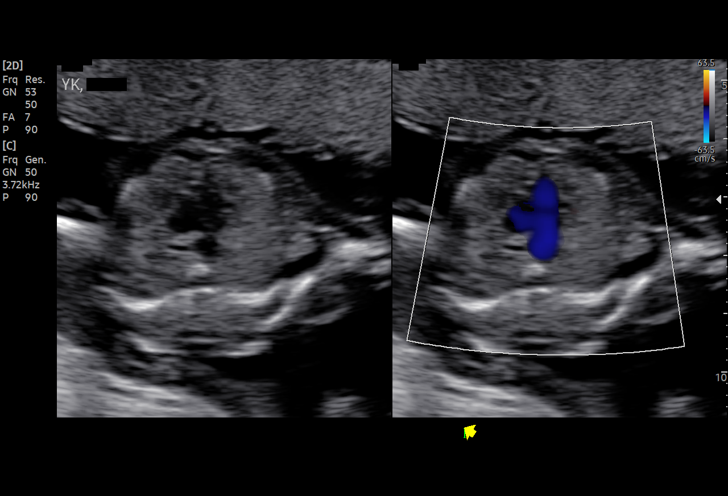
[im 30/116]
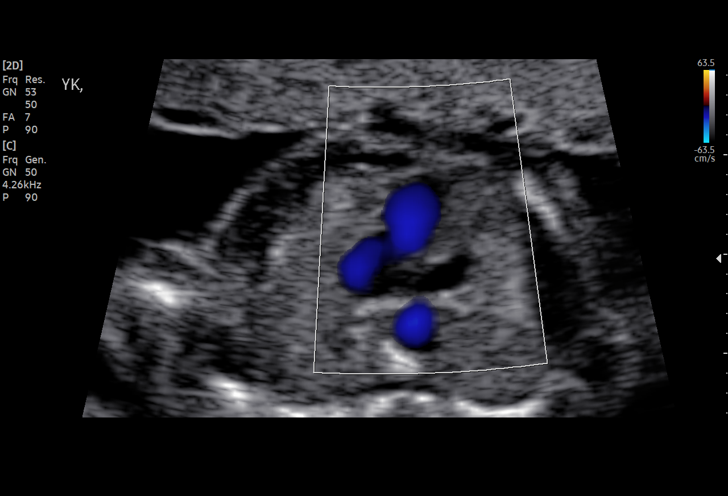
[im 39/116]
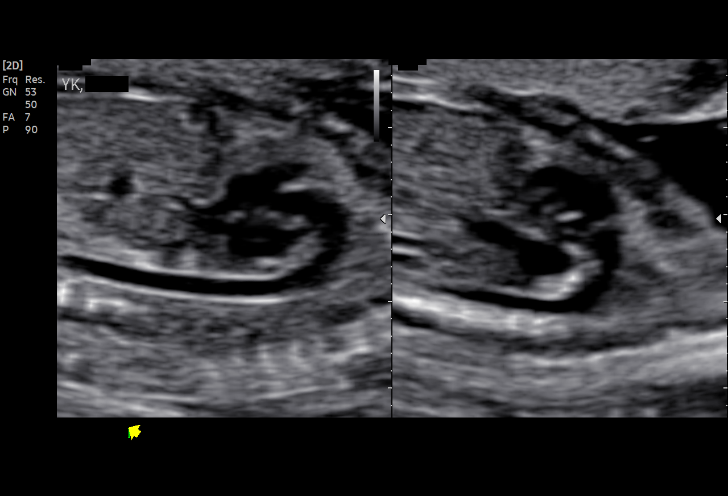
[im 47/116]
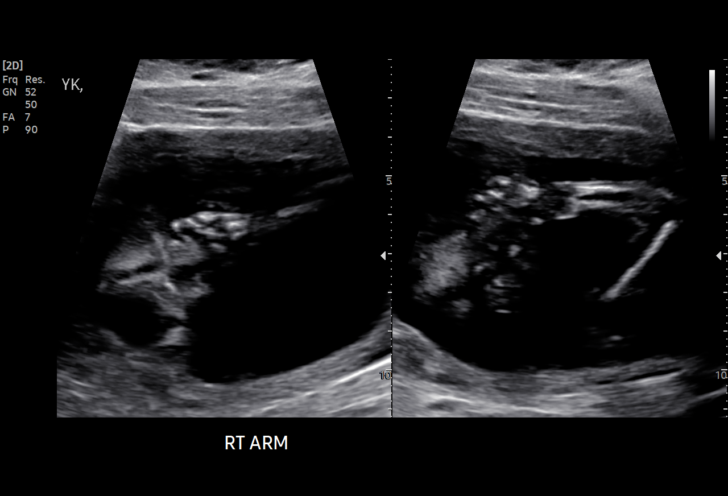
[im 60/116]
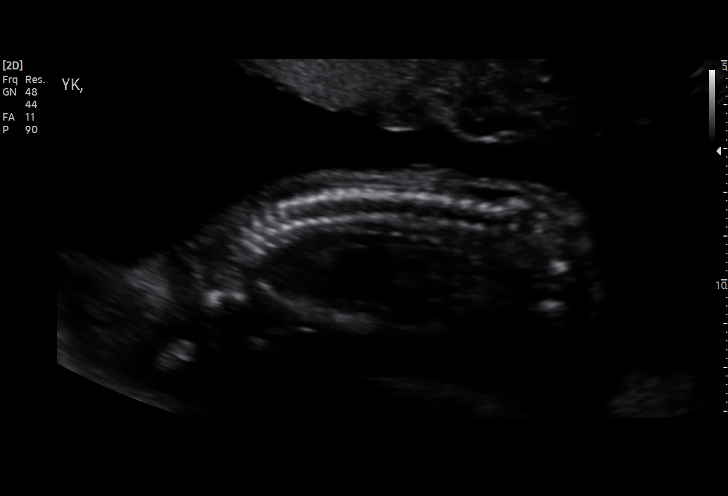
[im 69/116]
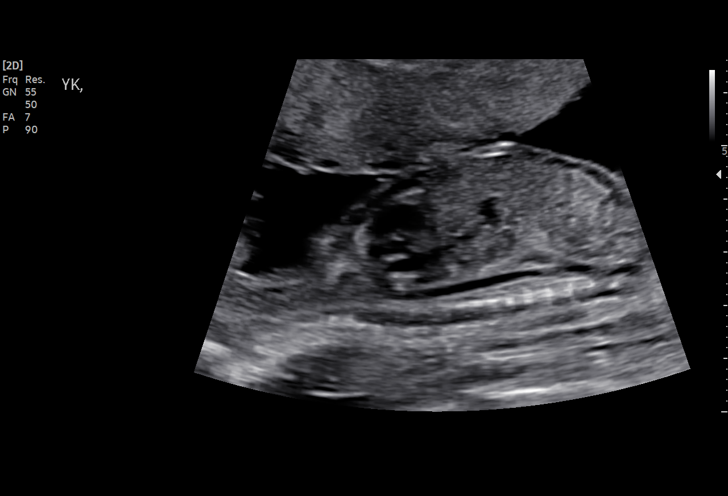
[im 77/116]
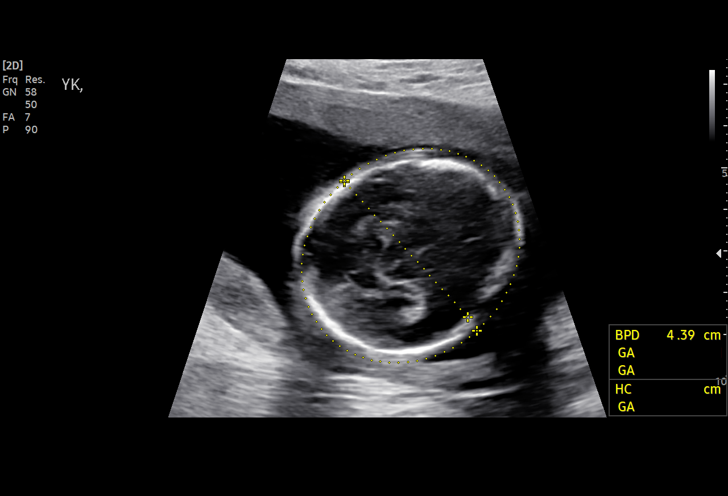
[im 86/116]
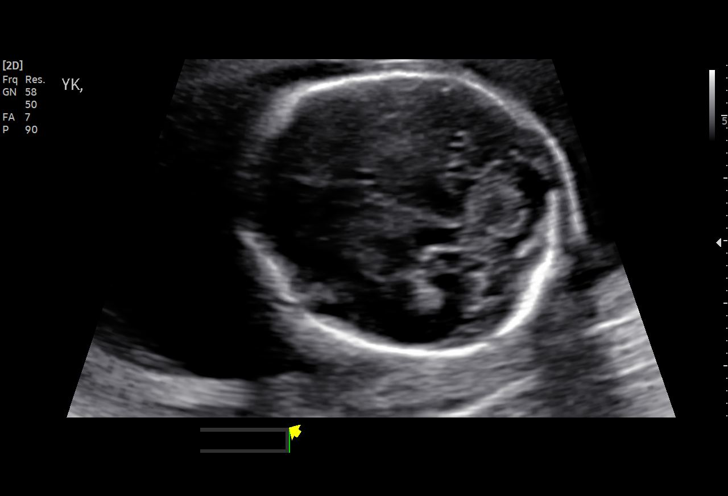
[im 94/116]
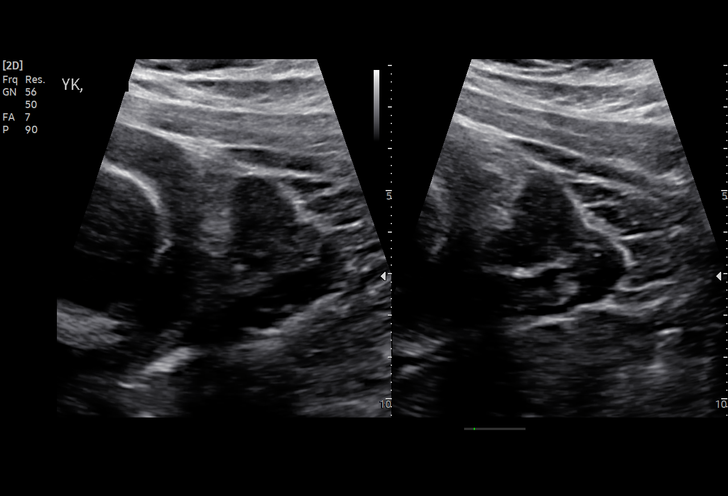
[im 103/116]
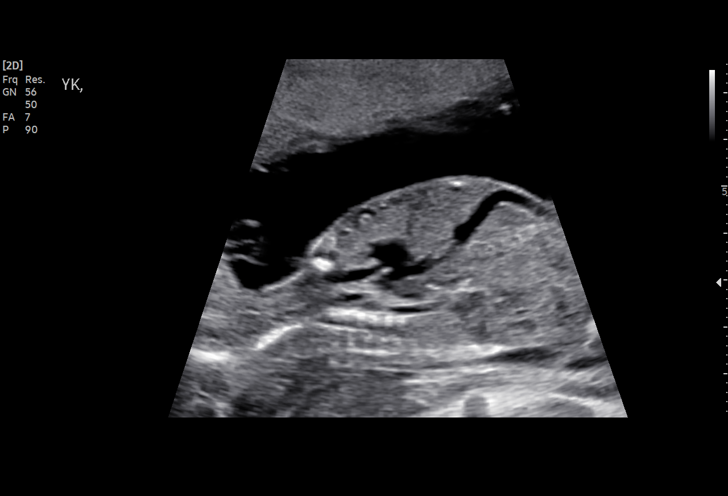
[im 111/116]
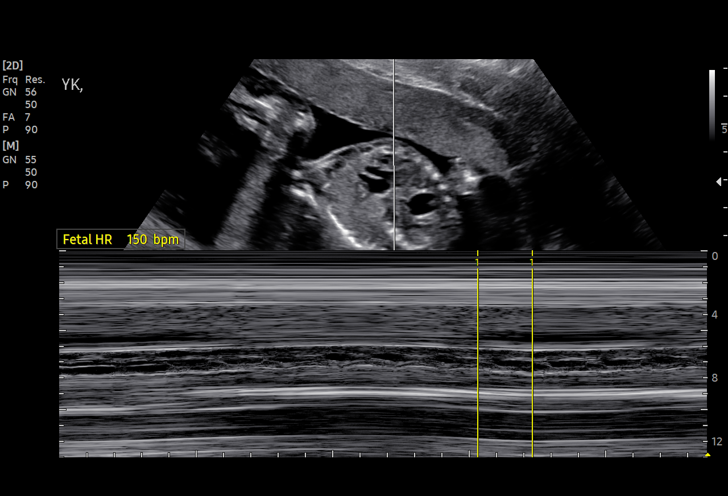

[13 of 28 positions shown; findings below may reference images not displayed]

[facility] [HOSP[facility]]
                   MROZ CNM

Indications

 Obesity complicating pregnancy, second
 trimester
 Poor obstetric history-Recurrent (habitual)
 abortion (3 consecutive ab's)
 Encounter for antenatal screening for
 malformations
 AFP: Pending, Horizon: Negative, NIPS:Low
 risk
 19 weeks gestation of pregnancy
Vital Signs

 BMI:
Fetal Evaluation

 Num Of Fetuses:         1
 Fetal Heart Rate(bpm):  150
 Cardiac Activity:       Observed
 Presentation:           Breech
 Placenta:               Anterior
 P. Cord Insertion:      Visualized, central

 Amniotic Fluid
 AFI FV:      Within normal limits

                             Largest Pocket(cm)
                             5
Biometry
 BPD:      44.6  mm     G. Age:  19w 3d         59  %    CI:        73.96   %    70 - 86
                                                         FL/HC:      17.7   %    16.1 -
 HC:      164.7  mm     G. Age:  19w 1d         38  %    HC/AC:      1.19        1.09 -
 AC:      138.8  mm     G. Age:  19w 2d         45  %    FL/BPD:     65.2   %
 FL:       29.1  mm     G. Age:  19w 0d         31  %    FL/AC:      21.0   %    20 - 24
 NFT:       5.9  mm
 CM:        3.9  mm

 Est. FW:     277  gm    0 lb 10 oz      38  %
OB History

 Gravidity:    4          SAB:   3
Gestational Age

 LMP:           19w 2d        Date:  07/15/19                 EDD:   04/20/20
 U/S Today:     19w 2d                                        EDD:   04/20/20
 Best:          19w 2d     Det. By:  LMP  (07/15/19)          EDD:   04/20/20
Anatomy

 Cranium:               Appears normal         Aortic Arch:            Appears normal
 Cavum:                 Appears normal         Ductal Arch:            Appears normal
 Ventricles:            Appears normal         Diaphragm:              Appears normal
 Choroid Plexus:        Appears normal         Stomach:                Appears normal, left
                                                                       sided
 Cerebellum:            Appears normal         Abdomen:                Appears normal
 Posterior Fossa:       Appears normal         Abdominal Wall:         Appears nml (cord
                                                                       insert, abd wall)
 Nuchal Fold:           Appears normal         Cord Vessels:           Appears normal (3
                                                                       vessel cord)
 Face:                  Appears normal         Kidneys:                Appear normal
                        (orbits and profile)
 Lips:                  Appears normal         Bladder:                Appears normal
 Thoracic:              Appears normal         Spine:                  Appears normal
 Heart:                 Appears normal         Upper Extremities:      Appears normal
                        (4CH, axis, and
                        situs)
 RVOT:                  Appears normal         Lower Extremities:      Appears normal
 LVOT:                  Appears normal

 Other:  Fetus appears to be female. Nasal bone visualized. Open hands
         visualized. Heels visualized. Technically difficult due to maternal
         habitus.
Cervix Uterus Adnexa

 Cervix
 Length:           3.34  cm.
 Normal appearance by transabdominal scan.

 Uterus
 No abnormality visualized.

 Right Ovary
 No adnexal mass visualized.
 Left Ovary
 No adnexal mass visualized.

 Cul De Sac
 No free fluid seen.

 Adnexa
 No abnormality visualized.
Impression

 We performed fetal anatomy scan. No makers of
 aneuploidies or fetal structural defects are seen. Fetal
 biometry is consistent with her previously-established dates.
 Amniotic fluid is normal and good fetal activity is seen.
 Patient understands the limitations of ultrasound in detecting
 fetal anomalies.
 Maternal obesity imposes limitations on the resolution of
 images, and failure to detect fetal anomalies is more common
 in obese pregnant women.

 On cell-free fetal DNA screening, the risks of fetal
 aneuploidies are not increased .
Recommendations

 -An appointment was made for her to return in 8 weeks for
 fetal growth assessment.
                 Pavon, Alexandru

## 2021-12-01 ENCOUNTER — Encounter: Payer: Self-pay | Admitting: *Deleted

## 2022-01-10 ENCOUNTER — Emergency Department
Admission: EM | Admit: 2022-01-10 | Discharge: 2022-01-10 | Disposition: A | Discharge status: Home / Self Care | Payer: Commercial Managed Care - POS | Attending: Emergency Medicine | Admitting: Emergency Medicine

## 2022-01-10 DIAGNOSIS — J040 Acute laryngitis: Secondary | ICD-10-CM | POA: Insufficient documentation

## 2022-01-10 DIAGNOSIS — J029 Acute pharyngitis, unspecified: Secondary | ICD-10-CM | POA: Insufficient documentation

## 2022-01-10 DIAGNOSIS — F17290 Nicotine dependence, other tobacco product, uncomplicated: Secondary | ICD-10-CM | POA: Insufficient documentation

## 2022-01-10 DIAGNOSIS — Z20822 Contact with and (suspected) exposure to covid-19: Secondary | ICD-10-CM | POA: Insufficient documentation

## 2022-01-10 DIAGNOSIS — R030 Elevated blood-pressure reading, without diagnosis of hypertension: Secondary | ICD-10-CM | POA: Insufficient documentation

## 2022-01-10 DIAGNOSIS — J4521 Mild intermittent asthma with (acute) exacerbation: Secondary | ICD-10-CM | POA: Insufficient documentation

## 2022-01-10 LAB — COVID-19 (SARS-COV-2) & INFLUENZA  A/B, NAA (ROCHE LIAT)
Influenza A: NOT DETECTED
Influenza B: NOT DETECTED
SARS CoV 2 Overall Result: NOT DETECTED

## 2022-01-10 LAB — URINE HCG QUALITATIVE: Urine HCG Qualitative: NEGATIVE

## 2022-01-10 LAB — GROUP A STREP, RAPID ANTIGEN: Group A Strep, Rapid Antigen: NEGATIVE

## 2022-01-10 MED ORDER — AMOXICILLIN-POT CLAVULANATE 875-125 MG PO TABS
1.0000 | ORAL_TABLET | Freq: Once | ORAL | Status: AC
Start: 2022-01-10 — End: 2022-01-10
  Administered 2022-01-10: 1 via ORAL
  Filled 2022-01-10: qty 1

## 2022-01-10 MED ORDER — PREDNISONE 20 MG PO TABS
60.0000 mg | ORAL_TABLET | Freq: Once | ORAL | Status: AC
Start: 2022-01-10 — End: 2022-01-10
  Administered 2022-01-10: 60 mg via ORAL
  Filled 2022-01-10: qty 3

## 2022-01-10 MED ORDER — ALBUTEROL SULFATE (2.5 MG/3ML) 0.083% IN NEBU
5.0000 mg | INHALATION_SOLUTION | Freq: Once | RESPIRATORY_TRACT | Status: AC
Start: 2022-01-10 — End: 2022-01-10
  Administered 2022-01-10: 5 mg via RESPIRATORY_TRACT
  Filled 2022-01-10: qty 6

## 2022-01-10 MED ORDER — IPRATROPIUM BROMIDE 0.02 % IN SOLN
0.5000 mg | Freq: Once | RESPIRATORY_TRACT | Status: AC
Start: 2022-01-10 — End: 2022-01-10
  Administered 2022-01-10: 0.5 mg via RESPIRATORY_TRACT
  Filled 2022-01-10: qty 2.5

## 2022-01-10 MED ORDER — AMOXICILLIN-POT CLAVULANATE 875-125 MG PO TABS
1.0000 | ORAL_TABLET | Freq: Two times a day (BID) | ORAL | 0 refills | Status: AC
Start: 2022-01-10 — End: 2022-01-20

## 2022-01-10 MED ORDER — PREDNISONE 20 MG PO TABS
40.0000 mg | ORAL_TABLET | Freq: Every day | ORAL | 0 refills | Status: AC
Start: 2022-01-11 — End: 2022-01-15

## 2022-01-10 NOTE — ED Provider Notes (Signed)
EMERGENCY DEPARTMENT NOTE     Patient initially seen and examined at     [facility] ([facility]) ASSIGNED       Date/Time Event User Comments    01/10/22 1908 PA/NP Provider Assigned [name] L [name], FNP assigned as Nurse Practitioner            HISTORY OF PRESENT ILLNESS     Chief Complaint: Sore Throat       31 y.o. female with past medical history as below presents for evaluation of sore throat and hoarse voice for 2 weeks.  Also had had intermittent cough, ear pain.  Denies nasal congestion.  Went to [facility] about a week ago and was told to take mucinex.  States she's been taking without improvement.  States has been wheezing and trying to use albuterol inhaler.    LMP: 11/23/21 (1.5 months ago, states she's had negative home preg tests)    Independent Historian (other than patient): No  Additional History Provided by Independent Historian:  MEDICAL HISTORY     Past Medical History:  Past Medical History:   Diagnosis Date    Asthma     Headache        Past Surgical History:  Past Surgical History:   Procedure Laterality Date    COLPOSCOPY  2018    TONSILLECTOMY  2019       Social History:  Social History     Socioeconomic History    Marital status: Married   Tobacco Use    Smoking status: Former     Types: Cigars     Quit date: 2019     Years since quitting: 4.5    Smokeless tobacco: Never   Vaping Use    Vaping Use: Never used   Substance and Sexual Activity    Alcohol use: Not Currently    Drug use: Never    Sexual activity: Yes     Partners: Male     Birth control/protection: None       Family History:  History reviewed. No pertinent family history.    Outpatient Medication:  Discharge Medication List as of 01/10/2022  9:05 PM        CONTINUE these medications which have NOT CHANGED    Details   DIPHENHYDRAMINE HCL PO Take by mouth, Historical Med      norgestimate-ethinyl estradiol (ORTHO-CYCLEN) 0.25-35 MG-MCG per tablet Take 1 tablet by mouth daily, Historical Med      albuterol  sulfate HFA (PROVENTIL) 108 (90 Base) MCG/ACT inhaler Inhale 2 puffs into the lungs every 6 (six) hours as needed for Wheezing, Historical Med      docusate sodium (COLACE) 100 MG capsule Take 1 capsule (100 mg total) by mouth daily, Starting Mon 04/21/2020, No Print      famotidine (PEPCID) 20 MG tablet Take 20 mg by mouth every 12 (twelve) hours, Historical Med      hydrocortisone 1 % ointment Apply topically 3 (three) times daily as needed (perineal care), Starting Sun 04/20/2020, No Print      ibuprofen (ADVIL) 800 MG tablet Take 1 tablet (800 mg total) by mouth every 8 (eight) hours, Starting Sun 04/20/2020, No Print      lanolin ointment Apply topically every 2 (two) hours as needed for Dry Skin (nipple discomfort), Starting Sun 04/20/2020, No Print      Prenatal MV-Min-Fe Fum-FA-DHA (PRENATAL 1 PO) Take by mouth, Historical Med      senna-docusate (PERICOLACE) 8.6-50 MG per  tablet Take 1 tablet by mouth nightly as needed for Constipation, Starting Sun 04/20/2020, No Print               REVIEW OF SYSTEMS   Review of Systems See History of Present Illness  PHYSICAL EXAM     ED Triage Vitals [01/10/22 1915]   Enc Vitals Group      BP (!) 156/101      Heart Rate 89      Resp Rate 16      Temp 97.6 F (36.4 C)      Temp Source Temporal      SpO2 100 %      Weight 99.8 kg      Height 1.626 m      Head Circumference       Peak Flow       Pain Score 9      Pain Loc       Pain Edu?       Excl. in GC?      Physical Exam  Vitals and nursing note reviewed.   Constitutional:       Appearance: She is well-developed.   HENT:      Head: Normocephalic and atraumatic.      Right Ear: Tympanic membrane is not erythematous.      Left Ear: Tympanic membrane is not erythematous.      Mouth/Throat:      Pharynx: No oropharyngeal exudate.      Tonsils: No tonsillar exudate or tonsillar abscesses.      Comments: Hoarse voice.  Tolerating oral secretions without difficulty.  Eyes:      Conjunctiva/sclera: Conjunctivae normal.       Pupils: Pupils are equal, round, and reactive to light.   Cardiovascular:      Rate and Rhythm: Normal rate and regular rhythm.   Pulmonary:      Effort: Pulmonary effort is normal.      Breath sounds: Normal breath sounds.   Musculoskeletal:      Cervical back: Normal range of motion and neck supple.   Neurological:      Mental Status: She is alert and oriented to person, place, and time.      Gait: Gait normal.        MEDICAL DECISION MAKING     PRIMARY PROBLEM LIST      Acute illness/injury DIAGNOSIS:Pharyngitis, Asthma exacerbation  Chronic Illness Impacting Care of the above problem: Asthma Increases complexity of evaluation, Increases the risk of severe disease, and Increase the risk of disease progression  Differential Diagnosis: URI (adult): pleuritis, URI, pneumonia, viral syndrome, asthma/COPD exacerbation, respiratory failure, sepsis, pulmonary edema, myocarditis, COVID19   DISCUSSION      Breath sounds are clear but she states she has been wheezing.  Will give neb.    No evidence of PTA in throat.  No drooling.  Covid, flu, RST negative.  Upreg is also negative.  She reports improvement in breathing with duoneb. Will treat with abx to cover for bacterial etiology as symptoms have been persistent for the past 2 weeks.  Advised to follow-up with ENT for no improvement after antibiotic treatment.  Rx for prednisone burst for asthma exacerbation.  She has albuterol inhaler at home.      BP elevated during visit.  She has never taken HTN medications.  States her BP has been elevated previously when she is ill but improves when she gets better.  I advised that she monitor and document  and follow-up with PCP.    Augmentin twice daily for 10 days to cover for bacterial infection.  Daily yogurt/probiotics while taking antibiotics.  Prednisone daily.  Increase fluids.  Ibuprofen and/or tylenol as needed.  Monitor and document BP.  Follow-up with ENT.  Follow-up with [facility] or your PCP regarding  your elevated BP.  Seek immediate medical attention for new or worsening symptoms.      Vital Signs: Reviewed the patient's vital signs.   Nursing Notes: Reviewed and utilized available nursing notes.  Medical Records Reviewed: Reviewed available past medical records.  Counseling: The emergency provider has spoken with the patient and discussed today's findings, in addition to providing specific details for the plan of care.  Questions are answered and there is agreement with the plan.      RADIOLOGY IMAGING STUDIES      No orders to display       EMERGENCY DEPT. MEDICATIONS      ED Medication Orders (From admission, onward)      Start Ordered     Status Ordering Provider    01/10/22 2059 01/10/22 2058  predniSONE (DELTASONE) tablet 60 mg  Once        Route: Oral  Ordered Dose: 60 mg       Last MAR action: Given Ayden Hardwick L    01/10/22 2059 01/10/22 2058  amoxicillin-clavulanate (AUGMENTIN) 875-125 MG per tablet 1 tablet  Once        Route: Oral  Ordered Dose: 1 tablet       Last MAR action: Given Grayland Daisey L    01/10/22 1939 01/10/22 1938  albuterol (PROVENTIL) (2.5 MG/3ML) 0.083% nebulizer solution 5 mg  RT - Once        Route: Nebulization  Ordered Dose: 5 mg       Last MAR action: Given Lacinda Curvin L    01/10/22 1939 01/10/22 1938  ipratropium (ATROVENT) 0.02 % nebulizer solution 0.5 mg  RT - Once        Route: Nebulization  Ordered Dose: 0.5 mg       Last MAR action: Given Semisi Biela L            LABORATORY RESULTS    Ordered and independently interpreted AVAILABLE laboratory tests.   Results       Procedure Component Value Units Date/Time    Urine HCG Qualitative [[id number]] Collected: 01/10/22 2030    Specimen: Urine Updated: 01/10/22 2037     Urine HCG Qualitative Negative    COVID-19 (SARS-CoV-2) and Influenza A/B, NAA (Liat Rapid) [[id number]] Collected: 01/10/22 1925    Specimen: Culturette from Nasopharyngeal Updated: 01/10/22 1948     Purpose of COVID testing Diagnostic -PUI      SARS-CoV-2 Specimen Source Nasal Swab     SARS CoV 2 Overall Result Not Detected     Influenza A Not Detected     Influenza B Not Detected    Narrative:      o Collect and clearly label specimen type:  o PREFERRED-Upper respiratory specimen: One Nasal Swab in  [employer].  o Hand deliver to laboratory ASAP  Diagnostic -[facility]I    GROUP A STREP, RAPID ANTIGEN [[id number]] Collected: 01/10/22 1925    Specimen: Throat Updated: 01/10/22 1935     Group A Strep, Rapid Antigen Negative              CRITICAL CARE/PROCEDURES    Procedures    DIAGNOSIS  Diagnosis:  Final diagnoses:   Pharyngitis, unspecified etiology   Laryngitis   Mild intermittent asthma with exacerbation   Elevated blood pressure reading       Disposition:  ED Disposition       ED Disposition   Discharge    Condition   --    Date/Time   Sun Jan 10, 2022  9:01 PM    Comment   Angela [name] discharge to home/self care.    Condition at disposition: Stable                 Prescriptions:  Discharge Medication List as of 01/10/2022  9:05 PM        START taking these medications    Details   amoxicillin-clavulanate (AUGMENTIN) 875-125 MG per tablet Take 1 tablet by mouth 2 (two) times daily for 10 days, Starting Sun 01/10/2022, Until Wed 01/20/2022, E-Rx      predniSONE (DELTASONE) 20 MG tablet Take 2 tablets (40 mg) by mouth daily for 4 days, Starting Mon 01/11/2022, Until Fri 01/15/2022, E-Rx           CONTINUE these medications which have NOT CHANGED    Details   DIPHENHYDRAMINE HCL PO Take by mouth, Historical Med      norgestimate-ethinyl estradiol (ORTHO-CYCLEN) 0.25-35 MG-MCG per tablet Take 1 tablet by mouth daily, Historical Med      albuterol sulfate HFA (PROVENTIL) 108 (90 Base) MCG/ACT inhaler Inhale 2 puffs into the lungs every 6 (six) hours as needed for Wheezing, Historical Med      docusate sodium (COLACE) 100 MG capsule Take 1 capsule (100 mg total) by mouth daily, Starting Mon 04/21/2020, No Print      famotidine (PEPCID) 20 MG tablet  Take 20 mg by mouth every 12 (twelve) hours, Historical Med      hydrocortisone 1 % ointment Apply topically 3 (three) times daily as needed (perineal care), Starting Sun 04/20/2020, No Print      ibuprofen (ADVIL) 800 MG tablet Take 1 tablet (800 mg total) by mouth every 8 (eight) hours, Starting Sun 04/20/2020, No Print      lanolin ointment Apply topically every 2 (two) hours as needed for Dry Skin (nipple discomfort), Starting Sun 04/20/2020, No Print      Prenatal MV-Min-Fe Fum-FA-DHA (PRENATAL 1 PO) Take by mouth, Historical Med      senna-docusate (PERICOLACE) 8.6-50 MG per tablet Take 1 tablet by mouth nightly as needed for Constipation, Starting Sun 04/20/2020, No Print                 This note was generated by the Epic EMR system/ Dragon speech recognition and may contain inherent errors or omissions not intended by the user. Grammatical errors, random word insertions, deletions and pronoun errors  are occasional consequences of this technology due to software limitations. Not all errors are caught or corrected. If there are questions or concerns about the content of this note or information contained within the body of this dictation they should be addressed directly with the author for clarification.           [name], FNP  01/10/22 2121

## 2022-01-10 NOTE — Discharge Instructions (Addendum)
Augmentin twice daily for 10 days to cover for bacterial infection.  Daily yogurt/probiotics while taking antibiotics.  Prednisone daily.  Increase fluids.  Ibuprofen and/or tylenol as needed.  Monitor and document BP.  Follow-up with ENT.  Follow-up with [facility] or your PCP regarding your elevated BP.  Seek immediate medical attention for new or worsening symptoms.

## 2022-01-16 ENCOUNTER — Emergency Department
Admission: EM | Admit: 2022-01-16 | Discharge: 2022-01-16 | Disposition: A | Discharge status: Home / Self Care | Payer: Commercial Managed Care - POS | Attending: Emergency Medicine | Admitting: Emergency Medicine

## 2022-01-16 DIAGNOSIS — R079 Chest pain, unspecified: Secondary | ICD-10-CM

## 2022-01-16 DIAGNOSIS — R519 Headache, unspecified: Secondary | ICD-10-CM | POA: Insufficient documentation

## 2022-01-16 DIAGNOSIS — F17290 Nicotine dependence, other tobacco product, uncomplicated: Secondary | ICD-10-CM | POA: Insufficient documentation

## 2022-01-16 LAB — ECG 12-LEAD
Atrial Rate: 86 {beats}/min
IHS MUSE NARRATIVE AND IMPRESSION: NORMAL
P-R Interval: 152 ms
QRS Duration: 72 ms
QTC Calculation (Bezet): 428 ms
R Axis: 19 degrees
T Axis: -1 degrees
Ventricular Rate: 86 {beats}/min

## 2022-01-16 LAB — CBC AND DIFFERENTIAL
Absolute NRBC: 0 10*3/uL (ref 0.00–0.00)
Basophils Absolute Automated: 0.06 10*3/uL (ref 0.00–0.08)
Basophils Automated: 0.6 %
Eosinophils Absolute Automated: 0.05 10*3/uL (ref 0.00–0.44)
Eosinophils Automated: 0.5 %
Hematocrit: 38.2 % (ref 34.7–43.7)
Hgb: 13 g/dL (ref 11.4–14.8)
Immature Granulocytes Absolute: 0.06 10*3/uL (ref 0.00–0.07)
Immature Granulocytes: 0.6 %
Instrument Absolute Neutrophil Count: 5.45 10*3/uL (ref 1.10–6.33)
Lymphocytes Absolute Automated: 4.28 10*3/uL — ABNORMAL HIGH (ref 0.42–3.22)
Lymphocytes Automated: 39.9 %
MCH: 31.7 pg (ref 25.1–33.5)
MCHC: 34 g/dL (ref 31.5–35.8)
MCV: 93.2 fL (ref 78.0–96.0)
MPV: 8.4 fL — ABNORMAL LOW (ref 8.9–12.5)
Monocytes Absolute Automated: 0.84 10*3/uL (ref 0.21–0.85)
Monocytes: 7.8 %
Neutrophils Absolute: 5.45 10*3/uL (ref 1.10–6.33)
Neutrophils: 50.6 %
Nucleated RBC: 0 /100 WBC (ref 0.0–0.0)
Platelets: 322 10*3/uL (ref 142–346)
RBC: 4.1 10*6/uL (ref 3.90–5.10)
RDW: 12 % (ref 11–15)
WBC: 10.74 10*3/uL — ABNORMAL HIGH (ref 3.10–9.50)

## 2022-01-16 LAB — BASIC METABOLIC PANEL
Anion Gap: 10 (ref 5.0–15.0)
BUN: 11 mg/dL (ref 7.0–21.0)
CO2: 23 mEq/L (ref 17–29)
Calcium: 9.1 mg/dL (ref 8.5–10.5)
Chloride: 106 mEq/L (ref 99–111)
Creatinine: 0.9 mg/dL (ref 0.4–1.0)
Glucose: 80 mg/dL (ref 70–100)
Potassium: 4.1 mEq/L (ref 3.5–5.3)
Sodium: 139 mEq/L (ref 135–145)
eGFR: >60 mL/min/{1.73_m2} (ref >=60)

## 2022-01-16 LAB — HCG, SERUM, QUALITATIVE: Hcg Qualitative: NEGATIVE

## 2022-01-16 LAB — MAGNESIUM: Magnesium: 2.3 mg/dL (ref 1.6–2.6)

## 2022-01-16 LAB — HIGH SENSITIVITY TROPONIN-I: hs Troponin-I: <2.7 ng/L

## 2022-01-16 MED ORDER — MAGNESIUM SULFATE IN D5W 1-5 GM/100ML-% IV SOLN
1.0000 g | Freq: Once | INTRAVENOUS | Status: AC
Start: 2022-01-16 — End: 2022-01-16
  Administered 2022-01-16: 1 g via INTRAVENOUS
  Filled 2022-01-16: qty 100

## 2022-01-16 MED ORDER — BUTALBITAL-APAP-CAFFEINE 50-325-40 MG PO TABS
1.0000 | ORAL_TABLET | ORAL | 0 refills | Status: AC | PRN
Start: 2022-01-16 — End: ?

## 2022-01-16 MED ORDER — METOCLOPRAMIDE HCL 5 MG/ML IJ SOLN
10.0000 mg | Freq: Once | INTRAMUSCULAR | Status: AC
Start: 2022-01-16 — End: 2022-01-16
  Administered 2022-01-16: 10 mg via INTRAVENOUS
  Filled 2022-01-16: qty 2

## 2022-01-16 MED ORDER — ACETAMINOPHEN 500 MG PO TABS
500.0000 mg | ORAL_TABLET | Freq: Once | ORAL | Status: AC
Start: 2022-01-16 — End: 2022-01-16
  Administered 2022-01-16: 500 mg via ORAL
  Filled 2022-01-16: qty 1

## 2022-01-16 NOTE — ED Provider Notes (Signed)
EMERGENCY DEPARTMENT NOTE     Patient initially seen and examined at   ED PHYSICIAN ASSIGNED       Date/Time Event User Comments    01/16/22 1448 Physician Assigned Azaliah Carrero, Zara Chess, MD assigned as Attending           [facility] ([facility]) ASSIGNED       None            HISTORY OF PRESENT ILLNESS     Chief Complaint: Headache       31 y.o. female with past medical history as below presents with 1 week of slow onset headache bilateral with photosensitivity, no fever or head injury or neck stiffness, endorses some jitteriness and chest tightness, patient seen earlier in the week for asthma symptoms and started on prednisone and Augmentin.     Additional History Provided by Independent Historian:  Independent Historian (other than patient): No        MEDICAL HISTORY     Past Medical History:  Past Medical History:   Diagnosis Date    Asthma     Headache        Past Surgical History:  Past Surgical History:   Procedure Laterality Date    COLPOSCOPY  2018    TONSILLECTOMY  2019       Social History:  Social History     Socioeconomic History    Marital status: Married   Tobacco Use    Smoking status: Former     Types: Cigars     Quit date: 2019     Years since quitting: 4.5    Smokeless tobacco: Never   Vaping Use    Vaping Use: Never used   Substance and Sexual Activity    Alcohol use: Not Currently    Drug use: Never    Sexual activity: Yes     Partners: Male     Birth control/protection: None       Family History:  History reviewed. No pertinent family history.    Outpatient Medication:  Discharge Medication List as of 01/16/2022  4:25 PM        CONTINUE these medications which have NOT CHANGED    Details   albuterol sulfate HFA (PROVENTIL) 108 (90 Base) MCG/ACT inhaler Inhale 2 puffs into the lungs every 6 (six) hours as needed for Wheezing, Historical Med      amoxicillin-clavulanate (AUGMENTIN) 875-125 MG per tablet Take 1 tablet by mouth 2 (two) times daily for 10 days, Starting Sun 01/10/2022, Until Wed  01/20/2022, E-Rx      DIPHENHYDRAMINE HCL PO Take by mouth, Historical Med      docusate sodium (COLACE) 100 MG capsule Take 1 capsule (100 mg total) by mouth daily, Starting Mon 04/21/2020, No Print      famotidine (PEPCID) 20 MG tablet Take 20 mg by mouth every 12 (twelve) hours, Historical Med      hydrocortisone 1 % ointment Apply topically 3 (three) times daily as needed (perineal care), Starting Sun 04/20/2020, No Print      ibuprofen (ADVIL) 800 MG tablet Take 1 tablet (800 mg total) by mouth every 8 (eight) hours, Starting Sun 04/20/2020, No Print      lanolin ointment Apply topically every 2 (two) hours as needed for Dry Skin (nipple discomfort), Starting Sun 04/20/2020, No Print      norgestimate-ethinyl estradiol (ORTHO-CYCLEN) 0.25-35 MG-MCG per tablet Take 1 tablet by mouth daily, Historical Med      Prenatal MV-Min-Fe Fum-FA-DHA (  PRENATAL 1 PO) Take by mouth, Historical Med      senna-docusate (PERICOLACE) 8.6-50 MG per tablet Take 1 tablet by mouth nightly as needed for Constipation, Starting Sun 04/20/2020, No Print               REVIEW OF SYSTEMS   See History of Present Illness    PHYSICAL EXAM     ED Triage Vitals [01/16/22 1447]   Enc Vitals Group      BP (!) 156/106      Heart Rate 89      Resp Rate 16      Temp 99.1 F (37.3 C)      Temp Source Oral      SpO2 100 %      Weight 99.8 kg      Height 1.626 m      Head Circumference       Peak Flow       Pain Score 10      Pain Loc       Pain Edu?       Excl. in GC?        Physical Exam  Vitals and nursing note reviewed.   Cardiovascular:      Rate and Rhythm: Normal rate and regular rhythm.   Pulmonary:      Effort: Pulmonary effort is normal. No respiratory distress.      Breath sounds: No wheezing.   Abdominal:      Palpations: Abdomen is soft.      Tenderness: There is no abdominal tenderness.   Neurological:      Mental Status: She is alert.      Comments: CN II-XII intact, finger to nose normal, no pronator drift           HPI & MEDICAL  DECISION MAKING       Chief Complaint: Headache       PRIMARY PROBLEM LIST     Acute illness/injury with risk to life or bodily function (based on differential diagnosis or evaluation) Moderate   Chronic Illness Impacting Care of the above problem: Obesity Increases complexity of evaluation  Differential Diagnosis in MDM below  DISCUSSION        Ddx: primary headache, don't suspect SAH/meningitis, prednisone side effect, ACS (Low heart score (if negative troponin), plan for labwork, migraine cocktail for dispo.       At discharge patient feels better. I have discussed all testing results and plan of care with patient. All questions solicited and addressed. Possibility of evolving illness reviewed. Patient agrees with going home, following up and returning if worsening symptoms.               Vital Signs: Reviewed the patient's vital signs.   Nursing Notes: Reviewed and utilized available nursing notes.  Counseling: The emergency provider has spoken with the patient and discussed today's findings, in addition to providing specific details for the plan of care.  Questions are answered and there is agreement with the plan.        If patient is being hospitalized is severe sepsis or septic shock suspected?: N/A        External Records Reviewed?: Inpatient Records  Additional Notes                    MIPS DOCUMENTATION                CARDIAC STUDIES    The following cardiac studies were independently  interpreted by the Emergency Medicine Physician.  For full cardiac study results please see chart.    EKG:  Interpreted by the EP.   Time Interpreted: 310Pm   Rate: 86   Interpretation: NSR, normal axis, normal intervals, no ST abnormalities, no TWI   Comparison: no prior      RADIOLOGY IMAGING STUDIES      No orders to display       EMERGENCY DEPT. MEDICATIONS      ED Medication Orders (From admission, onward)      Start Ordered     Status Ordering Provider    01/16/22 1503 01/16/22 1502  metoclopramide (REGLAN) injection  10 mg  Once        Route: Intravenous  Ordered Dose: 10 mg       Last MAR action: Given Adelyna Brockman    01/16/22 1503 01/16/22 1502  acetaminophen (TYLENOL) tablet 500 mg  Once        Route: Oral  Ordered Dose: 500 mg       Last MAR action: Given Denilson Salminen    01/16/22 1503 01/16/22 1502  magnesium sulfate 1g in dextrose 5% IVPB (premix)  Once        Route: Intravenous  Ordered Dose: 1 g       Last MAR action: Stopped Persephanie Laatsch            LABORATORY RESULTS    Ordered and independently interpreted AVAILABLE laboratory tests.   Results       Procedure Component Value Units Date/Time    High Sensitivity Troponin-I [[id number]] Collected: 01/16/22 1501    Specimen: Blood Updated: 01/16/22 1528     hs Troponin-I <2.7 ng/L     Basic Metabolic Panel [[id number]] Collected: 01/16/22 1501    Specimen: Blood Updated: 01/16/22 1524     Glucose 80 mg/dL      BUN 78.2 mg/dL      Creatinine 0.9 mg/dL      Calcium 9.1 mg/dL      Sodium 956 mEq/L      Potassium 4.1 mEq/L      Chloride 106 mEq/L      CO2 23 mEq/L      Anion Gap 10.0     eGFR >60.0 mL/min/1.73 m2     Magnesium [[id number]] Collected: 01/16/22 1501    Specimen: Blood Updated: 01/16/22 1524     Magnesium 2.3 mg/dL     Beta HCG, Qual, Serum [[id number]] Collected: 01/16/22 1501    Specimen: Blood Updated: 01/16/22 1512     Hcg Qualitative Negative    CBC and differential [[id number]]  (Abnormal) Collected: 01/16/22 1501    Specimen: Blood Updated: 01/16/22 1506     WBC 10.74 x10 3/uL      Hgb 13.0 g/dL      Hematocrit 27.2 %      Platelets 322 x10 3/uL      RBC 4.10 x10 6/uL      MCV 93.2 fL      MCH 31.7 pg      MCHC 34.0 g/dL      RDW 12 %      MPV 8.4 fL      Instrument Absolute Neutrophil Count 5.45 x10 3/uL      Neutrophils 50.6 %      Lymphocytes Automated 39.9 %      Monocytes 7.8 %      Eosinophils Automated 0.5 %      Basophils Automated 0.6 %  Immature Granulocytes 0.6 %      Nucleated RBC 0.0 /100 WBC      Neutrophils Absolute 5.45 x10 3/uL       Lymphocytes Absolute Automated 4.28 x10 3/uL      Monocytes Absolute Automated 0.84 x10 3/uL      Eosinophils Absolute Automated 0.05 x10 3/uL      Basophils Absolute Automated 0.06 x10 3/uL      Immature Granulocytes Absolute 0.06 x10 3/uL      Absolute NRBC 0.00 x10 3/uL               CRITICAL CARE/PROCEDURES    Procedures    DIAGNOSIS      Diagnosis:  Final diagnoses:   Headache disorder   Chest pain, unspecified type       Disposition:  ED Disposition       ED Disposition   Discharge    Condition   --    Date/Time   Sat Jan 16, 2022  4:40 PM    Comment   Akasha Melena Sandlin discharge to home/self care.    Condition at disposition: Stable                 Prescriptions:  Discharge Medication List as of 01/16/2022  4:25 PM        START taking these medications    Details   butalbital-acetaminophen-caffeine (FIORICET) 50-325-40 MG per tablet Take 1 tablet by mouth every 4 (four) hours as needed for Headaches, Starting Sat 01/16/2022, E-Rx           CONTINUE these medications which have NOT CHANGED    Details   albuterol sulfate HFA (PROVENTIL) 108 (90 Base) MCG/ACT inhaler Inhale 2 puffs into the lungs every 6 (six) hours as needed for Wheezing, Historical Med      amoxicillin-clavulanate (AUGMENTIN) 875-125 MG per tablet Take 1 tablet by mouth 2 (two) times daily for 10 days, Starting Sun 01/10/2022, Until Wed 01/20/2022, E-Rx      DIPHENHYDRAMINE HCL PO Take by mouth, Historical Med      docusate sodium (COLACE) 100 MG capsule Take 1 capsule (100 mg total) by mouth daily, Starting Mon 04/21/2020, No Print      famotidine (PEPCID) 20 MG tablet Take 20 mg by mouth every 12 (twelve) hours, Historical Med      hydrocortisone 1 % ointment Apply topically 3 (three) times daily as needed (perineal care), Starting Sun 04/20/2020, No Print      ibuprofen (ADVIL) 800 MG tablet Take 1 tablet (800 mg total) by mouth every 8 (eight) hours, Starting Sun 04/20/2020, No Print      lanolin ointment Apply topically every 2 (two) hours  as needed for Dry Skin (nipple discomfort), Starting Sun 04/20/2020, No Print      norgestimate-ethinyl estradiol (ORTHO-CYCLEN) 0.25-35 MG-MCG per tablet Take 1 tablet by mouth daily, Historical Med      Prenatal MV-Min-Fe Fum-FA-DHA (PRENATAL 1 PO) Take by mouth, Historical Med      senna-docusate (PERICOLACE) 8.6-50 MG per tablet Take 1 tablet by mouth nightly as needed for Constipation, Starting Sun 04/20/2020, No Print           STOP taking these medications       predniSONE (DELTASONE) 20 MG tablet Comments:   Reason for Stopping:               This note was generated by the Epic EMR system/ Dragon speech recognition and may contain inherent errors or  omissions not intended by the user. Grammatical errors, random word insertions, deletions and pronoun errors  are occasional consequences of this technology due to software limitations. Not all errors are caught or corrected. If there are questions or concerns about the content of this note or information contained within the body of this dictation they should be addressed directly with the author for clarification.       Suzy Bouchard, MD  01/16/22 [phone]

## 2022-01-17 LAB — ECG 12-LEAD
P Axis: 38 degrees
Q-T Interval: 358 ms

## 2022-03-04 ENCOUNTER — Emergency Department
Admission: EM | Admit: 2022-03-04 | Discharge: 2022-03-04 | Disposition: A | Discharge status: Home / Self Care | Payer: Commercial Managed Care - POS | Attending: Emergency Medicine | Admitting: Emergency Medicine

## 2022-03-04 ENCOUNTER — Emergency Department: Payer: Commercial Managed Care - POS

## 2022-03-04 DIAGNOSIS — N939 Abnormal uterine and vaginal bleeding, unspecified: Secondary | ICD-10-CM | POA: Insufficient documentation

## 2022-03-04 DIAGNOSIS — N926 Irregular menstruation, unspecified: Secondary | ICD-10-CM | POA: Insufficient documentation

## 2022-03-04 DIAGNOSIS — N946 Dysmenorrhea, unspecified: Secondary | ICD-10-CM | POA: Insufficient documentation

## 2022-03-04 LAB — CBC AND DIFFERENTIAL
Absolute NRBC: 0 10*3/uL (ref 0.00–0.00)
Basophils Absolute Automated: 0.04 10*3/uL (ref 0.00–0.08)
Basophils Automated: 0.7 %
Eosinophils Absolute Automated: 0.09 10*3/uL (ref 0.00–0.44)
Eosinophils Automated: 1.5 %
Hematocrit: 39.1 % (ref 34.7–43.7)
Hgb: 13.3 g/dL (ref 11.4–14.8)
Immature Granulocytes Absolute: 0.01 10*3/uL (ref 0.00–0.07)
Immature Granulocytes: 0.2 %
Instrument Absolute Neutrophil Count: 2.59 10*3/uL (ref 1.10–6.33)
Lymphocytes Absolute Automated: 2.6 10*3/uL (ref 0.42–3.22)
Lymphocytes Automated: 44.1 %
MCH: 31.5 pg (ref 25.1–33.5)
MCHC: 34 g/dL (ref 31.5–35.8)
MCV: 92.7 fL (ref 78.0–96.0)
MPV: 8.4 fL — ABNORMAL LOW (ref 8.9–12.5)
Monocytes Absolute Automated: 0.57 10*3/uL (ref 0.21–0.85)
Monocytes: 9.7 %
Neutrophils Absolute: 2.59 10*3/uL (ref 1.10–6.33)
Neutrophils: 43.8 %
Nucleated RBC: 0 /100 WBC (ref 0.0–0.0)
Platelets: 278 10*3/uL (ref 142–346)
RBC: 4.22 10*6/uL (ref 3.90–5.10)
RDW: 12 % (ref 11–15)
WBC: 5.9 10*3/uL (ref 3.10–9.50)

## 2022-03-04 LAB — COMPREHENSIVE METABOLIC PANEL
ALT: 13 U/L (ref 0–55)
AST (SGOT): 16 U/L (ref 5–41)
Albumin/Globulin Ratio: 1 (ref 0.9–2.2)
Albumin: 3.7 g/dL (ref 3.5–5.0)
Alkaline Phosphatase: 82 U/L (ref 37–117)
Anion Gap: 8 (ref 5.0–15.0)
BUN: 11 mg/dL (ref 7.0–21.0)
Bilirubin, Total: 0.4 mg/dL (ref 0.2–1.2)
CO2: 24 mEq/L (ref 17–29)
Calcium: 9.1 mg/dL (ref 8.5–10.5)
Chloride: 106 mEq/L (ref 99–111)
Creatinine: 1 mg/dL (ref 0.4–1.0)
Globulin: 3.6 g/dL (ref 2.0–3.6)
Glucose: 85 mg/dL (ref 70–100)
Potassium: 4 mEq/L (ref 3.5–5.3)
Protein, Total: 7.3 g/dL (ref 6.0–8.3)
Sodium: 138 mEq/L (ref 135–145)
eGFR: >60 mL/min/{1.73_m2} (ref >=60)

## 2022-03-04 LAB — URINALYSIS REFLEX TO MICROSCOPIC EXAM - REFLEX TO CULTURE
Bilirubin, UA: NEGATIVE
Blood, UA: NEGATIVE
Glucose, UA: NEGATIVE
Ketones UA: NEGATIVE
Leukocyte Esterase, UA: NEGATIVE
Nitrite, UA: NEGATIVE
Protein, UR: NEGATIVE
Specific Gravity UA: 1.02 (ref 1.001–1.035)
Urine pH: 6.5 (ref 5.0–8.0)
Urobilinogen, UA: 0.2 mg/dL (ref 0.2–2.0)

## 2022-03-04 LAB — URINE HCG QUALITATIVE: Urine HCG Qualitative: NEGATIVE

## 2022-03-04 MED ORDER — ONDANSETRON HCL 4 MG/2ML IJ SOLN
4.0000 mg | Freq: Once | INTRAMUSCULAR | Status: AC
Start: 2022-03-04 — End: 2022-03-04
  Administered 2022-03-04: 4 mg via INTRAVENOUS
  Filled 2022-03-04: qty 2

## 2022-03-04 MED ORDER — SODIUM CHLORIDE 0.9 % IV BOLUS
1000.0000 mL | Freq: Once | INTRAVENOUS | Status: AC
Start: 2022-03-04 — End: 2022-03-04
  Administered 2022-03-04: 1000 mL via INTRAVENOUS

## 2022-03-04 MED ORDER — ONDANSETRON 4 MG PO TBDP
4.0000 mg | ORAL_TABLET | Freq: Four times a day (QID) | ORAL | 0 refills | Status: DC | PRN
Start: 2022-03-04 — End: 2023-10-24

## 2022-03-04 MED ORDER — IODIXANOL 320 MG/ML IV SOLN
100.0000 mL | Freq: Once | INTRAVENOUS | Status: AC | PRN
Start: 2022-03-04 — End: 2022-03-04
  Administered 2022-03-04: 100 mL via INTRAVENOUS

## 2022-03-04 MED ORDER — KETOROLAC TROMETHAMINE 30 MG/ML IJ SOLN
30.0000 mg | Freq: Once | INTRAMUSCULAR | Status: AC
Start: 2022-03-04 — End: 2022-03-04
  Administered 2022-03-04: 30 mg via INTRAVENOUS
  Filled 2022-03-04: qty 1

## 2022-03-04 NOTE — ED Provider Notes (Signed)
EMERGENCY DEPARTMENT NOTE     Patient initially seen and examined at   ED PHYSICIAN ASSIGNED       Date/Time Event User Comments    03/04/22 1431 Physician Assigned Gertrude Bucks C. Janney Priego, Assunta Gambles, DO assigned as Attending           ED MIDLEVEL (APP) ASSIGNED       None            HISTORY OF PRESENT ILLNESS       Chief Complaint: Pelvic Pain       31 y.o. female with past medical history as below presents with vaginal bleeding and pelvic cramping and nausea/vomiting. She states she has had irregular menstrual cycles over the past few months since stopping birth control pills. She states vaginal bleeding started yesterday and is associated with pelvic cramping. Notes right side more painful than left side. Denies discharge, dysuria, hematuria, diarrhea. States she had four episodes of vomiting this  morning.     Independent Historian (other than patient): No  Additional History Provided by Independent Historian:  MEDICAL HISTORY     Past Medical History:  Past Medical History:   Diagnosis Date    Asthma     Headache        Past Surgical History:  Past Surgical History:   Procedure Laterality Date    COLPOSCOPY  2018    TONSILLECTOMY  2019       Social History:  Social History     Socioeconomic History    Marital status: Married   Tobacco Use    Smoking status: Former     Types: Cigars     Quit date: 2019     Years since quitting: 4.6    Smokeless tobacco: Never   Vaping Use    Vaping Use: Never used   Substance and Sexual Activity    Alcohol use: Not Currently    Drug use: Never    Sexual activity: Yes     Partners: Male     Birth control/protection: None       Family History:  History reviewed. No pertinent family history.    Outpatient Medication:  Previous Medications    ALBUTEROL SULFATE HFA (PROVENTIL) 108 (90 BASE) MCG/ACT INHALER    Inhale 2 puffs into the lungs every 6 (six) hours as needed for Wheezing    BUTALBITAL-ACETAMINOPHEN-CAFFEINE (FIORICET) 50-325-40 MG PER TABLET    Take 1 tablet by mouth  every 4 (four) hours as needed for Headaches    DIPHENHYDRAMINE HCL PO    Take by mouth    DOCUSATE SODIUM (COLACE) 100 MG CAPSULE    Take 1 capsule (100 mg total) by mouth daily    FAMOTIDINE (PEPCID) 20 MG TABLET    Take 20 mg by mouth every 12 (twelve) hours    HYDROCORTISONE 1 % OINTMENT    Apply topically 3 (three) times daily as needed (perineal care)    IBUPROFEN (ADVIL) 800 MG TABLET    Take 1 tablet (800 mg total) by mouth every 8 (eight) hours    LANOLIN OINTMENT    Apply topically every 2 (two) hours as needed for Dry Skin (nipple discomfort)    NORGESTIMATE-ETHINYL ESTRADIOL (ORTHO-CYCLEN) 0.25-35 MG-MCG PER TABLET    Take 1 tablet by mouth daily    PRENATAL MV-MIN-FE FUM-FA-DHA (PRENATAL 1 PO)    Take by mouth    SENNA-DOCUSATE (PERICOLACE) 8.6-50 MG PER TABLET    Take 1 tablet by mouth nightly as  needed for Constipation         REVIEW OF SYSTEMS   Review of Systems See History of Present Illness  PHYSICAL EXAM     ED Triage Vitals [03/04/22 1425]   Enc Vitals Group      BP (!) 167/113      Heart Rate 76      Resp Rate 16      Temp 98.4 F (36.9 C)      Temp Source Oral      SpO2 98 %      Weight 99.8 kg      Height 1.626 m      Head Circumference       Peak Flow       Pain Score 8      Pain Loc       Pain Edu?       Excl. in GC?      Physical Exam   Vital Signs and Nursing notes reviewed.    Constitutional: well appearing, well hydrated, non toxic, comfortable  HEENT: NC/AT, moist mucus membranes, oropharynx clear, nose normal, Pupils equal and reactive, EOMI  Neck: Supple, non tender, no bony or midline tenderness, full ROM, no meningeal signs  Pulmonary: CTAB, no wheezes rales or rhonchi. Normal work of breathing. No respiratory distress.  Cardiovascular: Regular Rate and Rhythm. No murmurs, rubs, or gallops.   Abdomen: Soft, mild tenderness right pelvis> left pelvis. non distended. No rebound, guarding, or rigidity  Extremities: no deformities, no edema, normal ROM, compartments soft,  neurovascularly intact throughout  Skin: normal, no rash, lesions, wounds, edema or color change  Neuro: CN 2-12 intact grossly, no facial asymmetry, no focal weakness, AAOx3  Psych: normal mood and affect       MEDICAL DECISION MAKING     PRIMARY PROBLEM LIST      Acute illness/injury with risk to life or bodily function (based on differential diagnosis or evaluation) DIAGNOSIS:pelvic pain     Differential Diagnosis: Abdominal Pain: bowel obstruction, abscess, colitis, diverticulitis, appendicitis, peritonitis, bowel perforation, mesenteric ischemia, abdominal aortic aneurysm, cholecystitis, biliay colic, cholangitis PID ovarian cyst, DUB, menorragia.  DISCUSSION      31 year old with irregular periods recently, vaginal bleeding that started yesterday and associated with pelvic cramping. Patient overall well appearing well hydrated and non toxic. Slight abdominal/pelvic tenderness right greater than left. Labs reassuring today. Negative preg test. Ct ap negative. Plan to [place] to home with recommendations to follow up with obgyn and pcp as needed.     If patient is being hospitalized is severe sepsis or septic shock suspected?: N/A          External Records Reviewed?: N/A    Additional Notes                     Vital Signs: Reviewed the patient's vital signs.   Nursing Notes: Reviewed and utilized available nursing notes.  Medical Records Reviewed: Reviewed available past medical records.  Counseling: The emergency provider has spoken with the patient and discussed today's findings, in addition to providing specific details for the plan of care.  Questions are answered and there is agreement with the plan.      MIPS DOCUMENTATION              CARDIAC STUDIES    The following cardiac studies were independently interpreted by me the Emergency Medicine Provider.  For full cardiac study results please see chart.  EMERGENCY IMAGING STUDIES    The following imagine studies were  independently interpreted by me (emergency medicine provider):                       RADIOLOGY IMAGING STUDIES      CT Abd/Pelvis with IV Contrast only   Final Result          1. No evidence of acute intra-abdominal pathology. Unremarkable CT   appearance of the appendix.      Aldean AstJane Kim, MD   03/04/2022 3:47 PM          EMERGENCY DEPT. MEDICATIONS      ED Medication Orders (From admission, onward)      Start Ordered     Status Ordering Provider    03/04/22 1540 03/04/22 1540  iodixanol (VISIPAQUE) 320 MG/ML injection 100 mL  IMG once as needed        Route: Intravenous  Ordered Dose: 100 mL       Last MAR action: Imaging Agent Given Wylan Gentzler C    03/04/22 1513 03/04/22 1512  sodium chloride 0.9 % bolus 1,000 mL  Once        Route: Intravenous  Ordered Dose: 1,000 mL       Last MAR action: New Bag Elta Angell C    03/04/22 1513 03/04/22 1512  ondansetron (ZOFRAN) injection 4 mg  Once        Route: Intravenous  Ordered Dose: 4 mg       Last MAR action: Given Amery Vandenbos C    03/04/22 1513 03/04/22 1512  ketorolac (TORADOL) injection 30 mg  Once        Route: Intravenous  Ordered Dose: 30 mg       Last MAR action: Given Bette Brienza C            LABORATORY RESULTS    Ordered and independently interpreted AVAILABLE laboratory tests.   Results       Procedure Component Value Units Date/Time    Comprehensive metabolic panel [[id number]][[id number]] Collected: 03/04/22 1450    Specimen: Blood Updated: 03/04/22 1524     Glucose 85 mg/dL      BUN 40.911.0 mg/dL      Creatinine 1.0 mg/dL      Sodium 811138 mEq/L      Potassium 4.0 mEq/L      Chloride 106 mEq/L      CO2 24 mEq/L      Calcium 9.1 mg/dL      Protein, Total 7.3 g/dL      Albumin 3.7 g/dL      AST (SGOT) 16 U/L      ALT 13 U/L      Alkaline Phosphatase 82 U/L      Bilirubin, Total 0.4 mg/dL      Globulin 3.6 g/dL      Albumin/Globulin Ratio 1.0     Anion Gap 8.0     eGFR >60.0 mL/min/1.73 m2     Urinalysis Reflex to Microscopic Exam- Reflex to Culture [[id number]][[id number]]  Collected: 03/04/22 1450     Updated: 03/04/22 1505     Urine Type Urine, Clean Ca     Color, UA YELLOW     Clarity, UA CLEAR     Specific Gravity UA 1.020     Urine pH 6.5     Leukocyte Esterase, UA NEGATIVE     Nitrite, UA NEGATIVE     Protein, UR NEGATIVE  Glucose, UA NEGATIVE     Ketones UA NEGATIVE     Urobilinogen, UA 0.2 mg/dL      Bilirubin, UA NEGATIVE     Blood, UA NEGATIVE     WBC, UA 0 - 5 /hpf     Urine HCG Qualitative [[id number]] Collected: 03/04/22 1450    Specimen: Urine Updated: 03/04/22 1505     Urine HCG Qualitative Negative    CBC and differential [[id number]]  (Abnormal) Collected: 03/04/22 1450    Specimen: Blood Updated: 03/04/22 1504     WBC 5.90 x10 3/uL      Hgb 13.3 g/dL      Hematocrit 65.7 %      Platelets 278 x10 3/uL      RBC 4.22 x10 6/uL      MCV 92.7 fL      MCH 31.5 pg      MCHC 34.0 g/dL      RDW 12 %      MPV 8.4 fL      Instrument Absolute Neutrophil Count 2.59 x10 3/uL      Neutrophils 43.8 %      Lymphocytes Automated 44.1 %      Monocytes 9.7 %      Eosinophils Automated 1.5 %      Basophils Automated 0.7 %      Immature Granulocytes 0.2 %      Nucleated RBC 0.0 /100 WBC      Neutrophils Absolute 2.59 x10 3/uL      Lymphocytes Absolute Automated 2.60 x10 3/uL      Monocytes Absolute Automated 0.57 x10 3/uL      Eosinophils Absolute Automated 0.09 x10 3/uL      Basophils Absolute Automated 0.04 x10 3/uL      Immature Granulocytes Absolute 0.01 x10 3/uL      Absolute NRBC 0.00 x10 3/uL               CRITICAL CARE/PROCEDURES    Procedures    DIAGNOSIS      Diagnosis:  Final diagnoses:   Dysmenorrhea       Disposition:  ED Disposition       ED Disposition   Discharge    Condition   --    Date/Time   Thu Mar 04, 2022  4:34 PM    Comment   Dillard Cannon Freimuth discharge to home/self care.    Condition at disposition: Stable                 Prescriptions:  Patient's Medications   New Prescriptions    No medications on file   Previous Medications    ALBUTEROL SULFATE HFA (PROVENTIL)  108 (90 BASE) MCG/ACT INHALER    Inhale 2 puffs into the lungs every 6 (six) hours as needed for Wheezing    BUTALBITAL-ACETAMINOPHEN-CAFFEINE (FIORICET) 50-325-40 MG PER TABLET    Take 1 tablet by mouth every 4 (four) hours as needed for Headaches    DIPHENHYDRAMINE HCL PO    Take by mouth    DOCUSATE SODIUM (COLACE) 100 MG CAPSULE    Take 1 capsule (100 mg total) by mouth daily    FAMOTIDINE (PEPCID) 20 MG TABLET    Take 20 mg by mouth every 12 (twelve) hours    HYDROCORTISONE 1 % OINTMENT    Apply topically 3 (three) times daily as needed (perineal care)    IBUPROFEN (ADVIL) 800 MG TABLET    Take 1 tablet (800 mg total) by mouth  every 8 (eight) hours    LANOLIN OINTMENT    Apply topically every 2 (two) hours as needed for Dry Skin (nipple discomfort)    NORGESTIMATE-ETHINYL ESTRADIOL (ORTHO-CYCLEN) 0.25-35 MG-MCG PER TABLET    Take 1 tablet by mouth daily    PRENATAL MV-MIN-FE FUM-FA-DHA (PRENATAL 1 PO)    Take by mouth    SENNA-DOCUSATE (PERICOLACE) 8.6-50 MG PER TABLET    Take 1 tablet by mouth nightly as needed for Constipation   Modified Medications    No medications on file   Discontinued Medications    No medications on file           This note was generated by the Epic EMR system/ Dragon speech recognition and may contain inherent errors or omissions not intended by the user. Grammatical errors, random word insertions, deletions and pronoun errors  are occasional consequences of this technology due to software limitations. Not all errors are caught or corrected. If there are questions or concerns about the content of this note or information contained within the body of this dictation they should be addressed directly with the author for clarification.           Kerry-Anne Mezo, Assunta Gambles, DO  03/04/22 1635

## 2022-03-04 NOTE — Discharge Instructions (Signed)
Please follow up with your primary care doctor in 1-2 days regarding your visit to the emergency department. If you develop any worsening of symptoms please contact your physician or return to the ER for reevaluation!

## 2022-03-04 NOTE — ED Notes (Signed)
VSS, NAD, IVL [place]'ed.  Pt given DCI and prescription, knows to fill medication and take as directed, understands to follow up with PCP and referred MD.  Hospital care, health status, disposition, and follow up were explained.  All questions answered and understood, pt demonstrated knowledge about health status, walked out of ED with steady gait.

## 2022-03-12 ENCOUNTER — Emergency Department
Admission: EM | Admit: 2022-03-12 | Discharge: 2022-03-12 | Disposition: A | Discharge status: Home / Self Care | Payer: Commercial Managed Care - POS | Attending: Emergency Medicine | Admitting: Emergency Medicine

## 2022-03-12 ENCOUNTER — Emergency Department
Admission: EM | Admit: 2022-03-12 | Discharge: 2022-03-12 | Discharge status: Against Medical Advice | Payer: Commercial Managed Care - POS | Attending: Emergency Medicine | Admitting: Emergency Medicine

## 2022-03-12 DIAGNOSIS — R112 Nausea with vomiting, unspecified: Secondary | ICD-10-CM | POA: Insufficient documentation

## 2022-03-12 DIAGNOSIS — E860 Dehydration: Secondary | ICD-10-CM | POA: Insufficient documentation

## 2022-03-12 DIAGNOSIS — Z5321 Procedure and treatment not carried out due to patient leaving prior to being seen by health care provider: Secondary | ICD-10-CM | POA: Insufficient documentation

## 2022-03-12 DIAGNOSIS — R102 Pelvic and perineal pain: Secondary | ICD-10-CM | POA: Insufficient documentation

## 2022-03-12 DIAGNOSIS — R519 Headache, unspecified: Secondary | ICD-10-CM | POA: Insufficient documentation

## 2022-03-12 LAB — COMPREHENSIVE METABOLIC PANEL
ALT: 11 U/L (ref 0–55)
AST (SGOT): 17 U/L (ref 5–41)
Albumin/Globulin Ratio: 1.1 (ref 0.9–2.2)
Albumin: 3.9 g/dL (ref 3.5–5.0)
Alkaline Phosphatase: 82 U/L (ref 37–117)
Anion Gap: 8 (ref 5.0–15.0)
BUN: 11 mg/dL (ref 7.0–21.0)
Bilirubin, Total: 0.6 mg/dL (ref 0.2–1.2)
CO2: 23 mEq/L (ref 17–29)
Calcium: 9.3 mg/dL (ref 8.5–10.5)
Chloride: 107 mEq/L (ref 99–111)
Creatinine: 0.8 mg/dL (ref 0.4–1.0)
Globulin: 3.5 g/dL (ref 2.0–3.6)
Glucose: 96 mg/dL (ref 70–100)
Potassium: 3.6 mEq/L (ref 3.5–5.3)
Protein, Total: 7.4 g/dL (ref 6.0–8.3)
Sodium: 138 mEq/L (ref 135–145)
eGFR: >60 mL/min/{1.73_m2} (ref >=60)

## 2022-03-12 LAB — CBC AND DIFFERENTIAL
Absolute NRBC: 0 10*3/uL (ref 0.00–0.00)
Basophils Absolute Automated: 0.03 10*3/uL (ref 0.00–0.08)
Basophils Automated: 0.6 %
Eosinophils Absolute Automated: 0.05 10*3/uL (ref 0.00–0.44)
Eosinophils Automated: 1 %
Hematocrit: 37.7 % (ref 34.7–43.7)
Hgb: 12.8 g/dL (ref 11.4–14.8)
Immature Granulocytes Absolute: 0.01 10*3/uL (ref 0.00–0.07)
Immature Granulocytes: 0.2 %
Instrument Absolute Neutrophil Count: 2.33 10*3/uL (ref 1.10–6.33)
Lymphocytes Absolute Automated: 2.34 10*3/uL (ref 0.42–3.22)
Lymphocytes Automated: 45.3 %
MCH: 31.4 pg (ref 25.1–33.5)
MCHC: 34 g/dL (ref 31.5–35.8)
MCV: 92.4 fL (ref 78.0–96.0)
MPV: 8.6 fL — ABNORMAL LOW (ref 8.9–12.5)
Monocytes Absolute Automated: 0.41 10*3/uL (ref 0.21–0.85)
Monocytes: 7.9 %
Neutrophils Absolute: 2.33 10*3/uL (ref 1.10–6.33)
Neutrophils: 45 %
Nucleated RBC: 0 /100 WBC (ref 0.0–0.0)
Platelets: 292 10*3/uL (ref 142–346)
RBC: 4.08 10*6/uL (ref 3.90–5.10)
RDW: 12 % (ref 11–15)
WBC: 5.17 10*3/uL (ref 3.10–9.50)

## 2022-03-12 LAB — HCG, SERUM, QUALITATIVE: Hcg Qualitative: NEGATIVE

## 2022-03-12 MED ORDER — SODIUM CHLORIDE 0.9 % IV BOLUS
2000.0000 mL | Freq: Once | INTRAVENOUS | Status: AC
Start: 2022-03-12 — End: 2022-03-12
  Administered 2022-03-12: 2000 mL via INTRAVENOUS

## 2022-03-12 MED ORDER — PROMETHAZINE HCL 25 MG PO TABS
25.0000 mg | ORAL_TABLET | Freq: Four times a day (QID) | ORAL | 0 refills | Status: DC | PRN
Start: 2022-03-12 — End: 2022-12-06

## 2022-03-12 MED ORDER — ONDANSETRON HCL 4 MG/2ML IJ SOLN
4.0000 mg | Freq: Once | INTRAMUSCULAR | Status: AC
Start: 2022-03-12 — End: 2022-03-12
  Administered 2022-03-12: 4 mg via INTRAVENOUS
  Filled 2022-03-12: qty 2

## 2022-03-12 MED ORDER — KETOROLAC TROMETHAMINE 30 MG/ML IJ SOLN
30.0000 mg | Freq: Once | INTRAMUSCULAR | Status: AC
Start: 2022-03-12 — End: 2022-03-12
  Administered 2022-03-12: 30 mg via INTRAVENOUS
  Filled 2022-03-12: qty 1

## 2022-03-12 NOTE — ED Provider Notes (Signed)
EMERGENCY DEPARTMENT NOTE     Patient initially seen and examined at   ED PHYSICIAN ASSIGNED       Date/Time Event User Comments    03/12/22 0808 Physician Assigned Leslee Home, MD assigned as Attending           ED MIDLEVEL (APP) ASSIGNED       None            HISTORY OF PRESENT ILLNESS   Translator Used : No    Chief Complaint: Dehydration, Headache, and Nausea       31 y.o. female with past medical history as below   Location of symptoms: pelvic pain, nausea, vomiting, headache, general weakness, malaise  Onset of symptoms: few weeks ago, worse today  What was patient doing when symptoms started (Context): Seen her ER in the last 2 weeks.  Had normal CT abdomen pelvis.  Was diagnosed with dysmenorrhea.  Was referred to OB/GYN.  Saw GYN on Monday.  Was told she may have uterine fibroids.  Scheduled for pelvic ultrasound later today at another facility.  Was prescribed Zofran after ED visit.  This is helping sometimes but not today.  History of migraines but feels that headache today is not a migraine, very mild.  Severity: 8 out of 10 pelvic pain, mild headache, severe nausea  Timing: Gradual onset, constant  Activities that worsen symptoms: None  Activities that improve symptoms: Zofran, ibuprofen  Quality: Nausea, vomiting.  Patient has been unable to drink any fluids this morning.  Vomitus does not have any blood in it.  Zofran has helped with some of her symptoms at home.  It did not help today.  She is having cramping pelvic pain across her entire pelvis.  Her last normal period was in May.  She had intermittent vaginal bleeding since.  She has a history of 4 pregnancies, 3 miscarriages.  Radiation of symptoms: None  Associated signs and Symptoms: No fever, no balance or coordination problems, no numbness or focal weakness, no visual symptoms, no speech problem  Are symptoms worsening?  Yes      Independent Historian (other than patient): No  Additional History Provided by  Independent Historian:  MEDICAL HISTORY     Past Medical History:  Past Medical History:   Diagnosis Date    Asthma     Headache        Past Surgical History:  Past Surgical History:   Procedure Laterality Date    COLPOSCOPY  2018    TONSILLECTOMY  2019       Social History:  Social History     Socioeconomic History    Marital status: Married   Tobacco Use    Smoking status: Former     Types: Cigars     Quit date: 2019     Years since quitting: 4.7    Smokeless tobacco: Never   Vaping Use    Vaping Use: Never used   Substance and Sexual Activity    Alcohol use: Not Currently    Drug use: Never    Sexual activity: Yes     Partners: Male     Birth control/protection: None       Family History:  History reviewed. No pertinent family history.    Outpatient Medication:  Discharge Medication List as of 03/12/2022 10:19 AM        CONTINUE these medications which have NOT CHANGED    Details   albuterol sulfate HFA (PROVENTIL) 108 (  90 Base) MCG/ACT inhaler Inhale 2 puffs into the lungs every 6 (six) hours as needed for Wheezing, Historical Med      butalbital-acetaminophen-caffeine (FIORICET) 50-325-40 MG per tablet Take 1 tablet by mouth every 4 (four) hours as needed for Headaches, Starting Sat 01/16/2022, E-Rx      DIPHENHYDRAMINE HCL PO Take by mouth, Historical Med      docusate sodium (COLACE) 100 MG capsule Take 1 capsule (100 mg total) by mouth daily, Starting Mon 04/21/2020, No Print      famotidine (PEPCID) 20 MG tablet Take 20 mg by mouth every 12 (twelve) hours, Historical Med      hydrocortisone 1 % ointment Apply topically 3 (three) times daily as needed (perineal care), Starting Sun 04/20/2020, No Print      ibuprofen (ADVIL) 800 MG tablet Take 1 tablet (800 mg total) by mouth every 8 (eight) hours, Starting Sun 04/20/2020, No Print      lanolin ointment Apply topically every 2 (two) hours as needed for Dry Skin (nipple discomfort), Starting Sun 04/20/2020, No Print      norgestimate-ethinyl estradiol  (ORTHO-CYCLEN) 0.25-35 MG-MCG per tablet Take 1 tablet by mouth daily, Historical Med      ondansetron (ZOFRAN-ODT) 4 MG disintegrating tablet Take 1 tablet (4 mg) by mouth every 6 (six) hours as needed for Nausea, Starting Thu 03/04/2022, E-Rx      Prenatal MV-Min-Fe Fum-FA-DHA (PRENATAL 1 PO) Take by mouth, Historical Med      senna-docusate (PERICOLACE) 8.6-50 MG per tablet Take 1 tablet by mouth nightly as needed for Constipation, Starting Sun 04/20/2020, No Print               REVIEW OF SYSTEMS   Review of Systems   Constitutional:  Positive for malaise/fatigue. Negative for fever.   Gastrointestinal:  Positive for abdominal pain, nausea and vomiting. Negative for diarrhea.   Neurological:  Positive for headaches. Negative for dizziness, sensory change, speech change, focal weakness and loss of consciousness.   Psychiatric/Behavioral:          No marijuana use   All other systems reviewed and are negative.      PHYSICAL EXAM     ED Triage Vitals [03/12/22 0809]   Enc Vitals Group      BP (!) 162/106      Heart Rate 77      Resp Rate 16      Temp 97.9 F (36.6 C)      Temp Source Oral      SpO2 99 %      Weight 113.4 kg      Height 1.626 m      Head Circumference       Peak Flow       Pain Score 9      Pain Loc       Pain Edu?       Excl. in GC?      Nursing note and vitals reviewed.    Constitutional: non-toxic  Head: Atraumatic.  Eyes: PERRL. EOMI. No scleral icterus.  ENT: Mucous membranes are moist and intact. Oropharynx is clear. Patent airway.  Neck: Supple. No cervical lymphadenopathy.  Cardiovascular: Regular rate. Regular rhythm. No murmurs, rubs, or gallops.  Pulmonary/Chest: No evidence of respiratory distress. Clear to auscultation bilaterally. No wheezing, rales or rhonchi.   GI: Soft, non-distended abdomen. No tenderness to palpation of abdomen.  Extremities: No edema. No deformity.  Skin: No rash.   Neurological: Awake, alert and  oriented x 3. CN II-XII intact. Strength intact. Sensation intact.  Normal finger to nose. Normal speech  Psychiatric: Appropriate affect. Appropriate mood. Appropriate behavior.    MEDICAL DECISION MAKING     PRIMARY PROBLEM LIST      Acute illness/injury with risk to life or bodily function (based on differential diagnosis or evaluation) DIAGNOSIS: Nausea, vomiting, dehydration, acute headache, acute pain in female pelvis  Chronic Illness Impacting Care of the above problem: N/A N/A    DISCUSSION      Pelvic pain consistent with dysmenorrhea.  I suppose is possible she could have uterine fibroids.  Her OB/GYN ordered a pelvic ultrasound.  At this point, I am doubtful that she has ovarian torsion.  I do not believe she needs a stat pelvic ultrasound.  She is scheduled for a pelvic ultrasound later this afternoon.  I believe it is acceptable for this to happen later.  Giving Toradol.    Nausea, vomiting.  This seems to be associated with her pelvic pain.  She tells me she felt better after receiving IV fluids when she was in the ER 1 2 weeks ago.  She has been taking Zofran for nausea.  This is intermittently worked.  It did not work today.  Giving IV saline bolus for likely dehydration.  Checking labs to rule out significant dehydration, renal dysfunction, probably electrolytes.    Headache.  I suspect this is due to dehydration.  I doubt she has an intracranial hemorrhage, brain mass causing her headache, vomiting symptoms.    Patient felt improved.  P.o. challenge well.  Discharged home.    If patient is being hospitalized is severe sepsis or septic shock suspected?: N/A          External Records Reviewed?: Inpatient Records  CT abdomen pelvis on 03/04/2022 noted for normal appendix, normal large and small bowel, no significant pelvic mass  Additional Notes                     Vital Signs: Reviewed the patient's vital signs.   Nursing Notes: Reviewed and utilized available nursing notes.  Medical Records Reviewed: Reviewed available past medical records.  Counseling: The emergency  provider has spoken with the patient and discussed today's findings, in addition to providing specific details for the plan of care.  Questions are answered and there is agreement with the plan.      MIPS DOCUMENTATION              CARDIAC STUDIES    The following cardiac studies were independently interpreted by me the Emergency Medicine Provider.  For full cardiac study results please see chart.                                        EMERGENCY IMAGING STUDIES    The following imagine studies were independently interpreted by me (emergency medicine provider):                       RADIOLOGY IMAGING STUDIES      No orders to display       EMERGENCY DEPT. MEDICATIONS      ED Medication Orders (From admission, onward)      Start Ordered     Status Ordering Provider    03/12/22 0830 03/12/22 0829  ketorolac (TORADOL) injection 30 mg  Once  Route: Intravenous  Ordered Dose: 30 mg       Last MAR action: Given Georgana Curio    03/12/22 0829 03/12/22 0828  sodium chloride 0.9 % bolus 2,000 mL  Once        Route: Intravenous  Ordered Dose: 2,000 mL       Last MAR action: Stopped Georgana Curio    03/12/22 0829 03/12/22 0828  ondansetron (ZOFRAN) injection 4 mg  Once        Route: Intravenous  Ordered Dose: 4 mg       Last MAR action: Given Charmaine Placido W            LABORATORY RESULTS    Ordered and independently interpreted AVAILABLE laboratory tests.   Results       Procedure Component Value Units Date/Time    Comprehensive metabolic panel [[id number]] Collected: 03/12/22 0840    Specimen: Blood Updated: 03/12/22 0913     Glucose 96 mg/dL      BUN 40.9 mg/dL      Creatinine 0.8 mg/dL      Sodium 811 mEq/L      Potassium 3.6 mEq/L      Chloride 107 mEq/L      CO2 23 mEq/L      Calcium 9.3 mg/dL      Protein, Total 7.4 g/dL      Albumin 3.9 g/dL      AST (SGOT) 17 U/L      ALT 11 U/L      Alkaline Phosphatase 82 U/L      Bilirubin, Total 0.6 mg/dL      Globulin 3.5 g/dL      Albumin/Globulin Ratio 1.1      Anion Gap 8.0     eGFR >60.0 mL/min/1.73 m2     Beta HCG, Qual, Serum [[id number]] Collected: 03/12/22 0840    Specimen: Blood Updated: 03/12/22 0910     Hcg Qualitative Negative    CBC and differential [[id number]]  (Abnormal) Collected: 03/12/22 0840    Specimen: Blood Updated: 03/12/22 0854     WBC 5.17 x10 3/uL      Hgb 12.8 g/dL      Hematocrit 46.9 %      Platelets 292 x10 3/uL      RBC 4.08 x10 6/uL      MCV 92.4 fL      MCH 31.4 pg      MCHC 34.0 g/dL      RDW 12 %      MPV 8.6 fL      Instrument Absolute Neutrophil Count 2.33 x10 3/uL      Neutrophils 45.0 %      Lymphocytes Automated 45.3 %      Monocytes 7.9 %      Eosinophils Automated 1.0 %      Basophils Automated 0.6 %      Immature Granulocytes 0.2 %      Nucleated RBC 0.0 /100 WBC      Neutrophils Absolute 2.33 x10 3/uL      Lymphocytes Absolute Automated 2.34 x10 3/uL      Monocytes Absolute Automated 0.41 x10 3/uL      Eosinophils Absolute Automated 0.05 x10 3/uL      Basophils Absolute Automated 0.03 x10 3/uL      Immature Granulocytes Absolute 0.01 x10 3/uL      Absolute NRBC 0.00 x10 3/uL  CRITICAL CARE/PROCEDURES    Procedures    DIAGNOSIS      Diagnosis:  Final diagnoses:   Nausea and vomiting, unspecified vomiting type   Dehydration   Acute nonintractable headache, unspecified headache type   Acute pain in female pelvis       Disposition:  ED Disposition       ED Disposition   Discharge    Condition   --    Date/Time   Fri Mar 12, 2022 10:19 AM    Comment   Dillard Cannon Pickelsimer discharge to home/self care.    Condition at disposition: Stable                 Prescriptions:  Discharge Medication List as of 03/12/2022 10:19 AM        START taking these medications    Details   promethazine (PHENERGAN) 25 MG tablet Take 1 tablet (25 mg) by mouth every 6 (six) hours as needed for Nausea, Starting Fri 03/12/2022, E-Rx           CONTINUE these medications which have NOT CHANGED    Details   albuterol sulfate HFA (PROVENTIL) 108 (90 Base)  MCG/ACT inhaler Inhale 2 puffs into the lungs every 6 (six) hours as needed for Wheezing, Historical Med      butalbital-acetaminophen-caffeine (FIORICET) 50-325-40 MG per tablet Take 1 tablet by mouth every 4 (four) hours as needed for Headaches, Starting Sat 01/16/2022, E-Rx      DIPHENHYDRAMINE HCL PO Take by mouth, Historical Med      docusate sodium (COLACE) 100 MG capsule Take 1 capsule (100 mg total) by mouth daily, Starting Mon 04/21/2020, No Print      famotidine (PEPCID) 20 MG tablet Take 20 mg by mouth every 12 (twelve) hours, Historical Med      hydrocortisone 1 % ointment Apply topically 3 (three) times daily as needed (perineal care), Starting Sun 04/20/2020, No Print      ibuprofen (ADVIL) 800 MG tablet Take 1 tablet (800 mg total) by mouth every 8 (eight) hours, Starting Sun 04/20/2020, No Print      lanolin ointment Apply topically every 2 (two) hours as needed for Dry Skin (nipple discomfort), Starting Sun 04/20/2020, No Print      norgestimate-ethinyl estradiol (ORTHO-CYCLEN) 0.25-35 MG-MCG per tablet Take 1 tablet by mouth daily, Historical Med      ondansetron (ZOFRAN-ODT) 4 MG disintegrating tablet Take 1 tablet (4 mg) by mouth every 6 (six) hours as needed for Nausea, Starting Thu 03/04/2022, E-Rx      Prenatal MV-Min-Fe Fum-FA-DHA (PRENATAL 1 PO) Take by mouth, Historical Med      senna-docusate (PERICOLACE) 8.6-50 MG per tablet Take 1 tablet by mouth nightly as needed for Constipation, Starting Sun 04/20/2020, No Print                 This note was generated by the Epic EMR system/ Dragon speech recognition and may contain inherent errors or omissions not intended by the user. Grammatical errors, random word insertions, deletions and pronoun errors  are occasional consequences of this technology due to software limitations. Not all errors are caught or corrected. If there are questions or concerns about the content of this note or information contained within the body of this dictation they  should be addressed directly with the author for clarification.           Georgana Curio, MD  03/12/22 1547

## 2022-06-24 ENCOUNTER — Emergency Department: Payer: Commercial Managed Care - POS

## 2022-06-24 ENCOUNTER — Emergency Department
Admission: EM | Admit: 2022-06-24 | Discharge: 2022-06-24 | Disposition: A | Discharge status: Home / Self Care | Payer: Commercial Managed Care - POS | Attending: Emergency Medicine | Admitting: Emergency Medicine

## 2022-06-24 DIAGNOSIS — R112 Nausea with vomiting, unspecified: Secondary | ICD-10-CM | POA: Insufficient documentation

## 2022-06-24 DIAGNOSIS — R1031 Right lower quadrant pain: Secondary | ICD-10-CM | POA: Insufficient documentation

## 2022-06-24 LAB — URINALYSIS WITH REFLEX TO MICROSCOPIC EXAM - REFLEX TO CULTURE
Bilirubin, UA: NEGATIVE
Blood, UA: NEGATIVE
Glucose, UA: NEGATIVE
Ketones UA: NEGATIVE
Leukocyte Esterase, UA: NEGATIVE
Nitrite, UA: NEGATIVE
Specific Gravity UA: 1.021 (ref 1.001–1.035)
Urine pH: 7 (ref 5.0–8.0)
Urobilinogen, UA: NORMAL mg/dL (ref 0.2–2.0)

## 2022-06-24 LAB — CBC AND DIFFERENTIAL
Absolute NRBC: 0 10*3/uL (ref 0.00–0.00)
Basophils Absolute Automated: 0.04 10*3/uL (ref 0.00–0.08)
Basophils Automated: 0.8 %
Eosinophils Absolute Automated: 0.14 10*3/uL (ref 0.00–0.44)
Eosinophils Automated: 2.6 %
Hematocrit: 37.1 % (ref 34.7–43.7)
Hgb: 12.7 g/dL (ref 11.4–14.8)
Immature Granulocytes Absolute: 0.01 10*3/uL (ref 0.00–0.07)
Immature Granulocytes: 0.2 %
Instrument Absolute Neutrophil Count: 1.98 10*3/uL (ref 1.10–6.33)
Lymphocytes Absolute Automated: 2.47 10*3/uL (ref 0.42–3.22)
Lymphocytes Automated: 46.6 %
MCH: 31.4 pg (ref 25.1–33.5)
MCHC: 34.2 g/dL (ref 31.5–35.8)
MCV: 91.6 fL (ref 78.0–96.0)
MPV: 8.9 fL (ref 8.9–12.5)
Monocytes Absolute Automated: 0.66 10*3/uL (ref 0.21–0.85)
Monocytes: 12.5 %
Neutrophils Absolute: 1.98 10*3/uL (ref 1.10–6.33)
Neutrophils: 37.3 %
Nucleated RBC: 0 /100 WBC (ref 0.0–0.0)
Platelets: 251 10*3/uL (ref 142–346)
RBC: 4.05 10*6/uL (ref 3.90–5.10)
RDW: 12 % (ref 11–15)
WBC: 5.3 10*3/uL (ref 3.10–9.50)

## 2022-06-24 LAB — LIPASE: Lipase: 25 U/L (ref 8–78)

## 2022-06-24 LAB — COMPREHENSIVE METABOLIC PANEL
ALT: 11 U/L (ref 0–55)
AST (SGOT): 15 U/L (ref 5–41)
Albumin/Globulin Ratio: 0.9 (ref 0.9–2.2)
Albumin: 3.5 g/dL (ref 3.5–5.0)
Alkaline Phosphatase: 91 U/L (ref 37–117)
Anion Gap: 8 (ref 5.0–15.0)
BUN: 8 mg/dL (ref 7.0–21.0)
Bilirubin, Total: 0.5 mg/dL (ref 0.2–1.2)
CO2: 22 mEq/L (ref 17–29)
Calcium: 8.3 mg/dL — ABNORMAL LOW (ref 8.5–10.5)
Chloride: 104 mEq/L (ref 99–111)
Creatinine: 0.8 mg/dL (ref 0.4–1.0)
Globulin: 4.1 g/dL — ABNORMAL HIGH (ref 2.0–3.6)
Glucose: 86 mg/dL (ref 70–100)
Potassium: 3.5 mEq/L (ref 3.5–5.3)
Protein, Total: 7.6 g/dL (ref 6.0–8.3)
Sodium: 134 mEq/L — ABNORMAL LOW (ref 135–145)
eGFR: >60 mL/min/{1.73_m2} (ref >=60)

## 2022-06-24 LAB — SERUM HCG, QUALITATIVE: Hcg Qualitative: NEGATIVE

## 2022-06-24 MED ORDER — MORPHINE SULFATE 4 MG/ML IJ/IV SOLN (WRAP)
4.0000 mg | Freq: Once | Status: AC
Start: 2022-06-24 — End: 2022-06-24
  Administered 2022-06-24: 4 mg via INTRAVENOUS
  Filled 2022-06-24: qty 1

## 2022-06-24 MED ORDER — SODIUM CHLORIDE 0.9 % IV BOLUS
1000.0000 mL | Freq: Once | INTRAVENOUS | Status: AC
Start: 2022-06-24 — End: 2022-06-24
  Administered 2022-06-24: 1000 mL via INTRAVENOUS

## 2022-06-24 MED ORDER — ONDANSETRON 4 MG PO TBDP
4.0000 mg | ORAL_TABLET | Freq: Four times a day (QID) | ORAL | 0 refills | Status: DC | PRN
Start: 2022-06-24 — End: 2023-10-24

## 2022-06-24 MED ORDER — IOHEXOL 350 MG/ML IV SOLN
100.0000 mL | Freq: Once | INTRAVENOUS | Status: AC | PRN
Start: 2022-06-24 — End: 2022-06-24
  Administered 2022-06-24: 100 mL via INTRAVENOUS

## 2022-06-24 MED ORDER — ONDANSETRON HCL 4 MG/2ML IJ SOLN
4.0000 mg | Freq: Once | INTRAMUSCULAR | Status: AC
Start: 2022-06-24 — End: 2022-06-24
  Administered 2022-06-24: 4 mg via INTRAVENOUS
  Filled 2022-06-24: qty 2

## 2022-06-24 MED ORDER — IBUPROFEN 600 MG PO TABS
600.0000 mg | ORAL_TABLET | Freq: Three times a day (TID) | ORAL | 0 refills | Status: DC | PRN
Start: 2022-06-24 — End: 2022-09-29

## 2022-06-24 NOTE — ED Triage Notes (Signed)
Pt reports RLQ ABD pain that radiates around to right flank x1 wk. Pt also reports N/V x1 day. Denies urinary symptoms and reports normal bowel movements.

## 2022-06-24 NOTE — Discharge Instructions (Signed)
Your labs today were very reassuring.  Your CT scan was negative for acute pathology.  At this time I am unsure of the exact cause of your pain.  We discussed getting an ultrasound but you preferred to do this outpatient with your OB/GYN.  I think it would be worthwhile to also speak with gastroenterology.  Please follow-up with both your OB/GYN and [facility] gastroenterology.  For your pain, please take Tylenol 1000 mg every 8 hours and alternate with ibuprofen 600 mg every 8 hours so you have something scheduled for pain every 4 hours.  Please alternate your promethazine with Zofran every 6 hours so that you have something for nausea and vomiting.  Return to the emergency room if your symptoms are worsening or changing or you would like to be reevaluated

## 2022-06-24 NOTE — EDIE (Signed)
[facility]?NOTIFICATION?06/24/2022 14:59?SHAYLAN, TUTTON S?MRN: [id number]    Criteria Met      5 ED Visits in 12 Months    Security and Safety  No Security Events were found.  ED Care Guidelines  There are currently no ED Care Guidelines for this patient. Please check your facility's medical records system.        Prescription Drug Data  No Prescription Drug Data was found.    E.D. Visit Count (12 mo.)  Facility Visits   [place] [facility]: [facility] 5   Total 5   Note: Visits indicate total known visits.     Recent Emergency Department Visit Summary  Date Facility [facility] Type Diagnoses or Chief Complaint    Jun 24, 2022  [place] [facility]: [facility].  [place]  Emergency      Abdominal Pain      Mar 12, 2022  [place] [facility]: [facility].  [place]  Emergency      Dehydration      Headache, unspecified      Pelvic and perineal pain      Nausea with vomiting, unspecified      Nausea      Headache      vomiting/weakness      Mar 04, 2022  [place] [facility]: [facility].  [place]  Emergency      Dysmenorrhea, unspecified      Pelvic Pain      abdominal pain; emesis; headache      Jan 16, 2022  [place] [facility]: [facility].  [place]  Emergency      Chest pain, unspecified      Headache, unspecified      Headache      high blood pressure/headache      Jan 10, 2022   Emergency Room: [facility].  [place]  Emergency      Elevated blood-pressure reading, without diagnosis of hypertension      Acute pharyngitis, unspecified      Acute laryngitis      Mild intermittent asthma with (acute) exacerbation      Sore Throat      Sorethroat,cough        Recent Inpatient Visit Summary  No Recent Inpatient Visits were found.  Care Team  No Care Team was found.  [facility]  This patient has registered at the [facility] Emergency Room: [facility] Emergency Department  For more information visit:  [email or web address]     PLEASE NOTE:     1.   Any care recommendations and other clinical information are provided as guidelines or for historical purposes only, and providers should exercise their own clinical judgment when providing care.    2.   You may only use this information for purposes of treatment, payment or health care operations activities, and subject to the limitations of applicable [facility] Policies.    3.   You should consult directly with the organization that provided a care guideline or other clinical history with any questions about additional information or accuracy or completeness of information provided.    ? 2023 [facility] - [email or web address]

## 2022-06-24 NOTE — ED Notes (Signed)
Pt. Discharged at 2005 after given [place] instructions and questions answered. IV removed, catheter intact. Pt. Ambulatory from ED to home in NAD.

## 2022-06-24 NOTE — ED Provider Notes (Signed)
EMERGENCY DEPARTMENT NOTE     Patient initially seen and examined at   ED PHYSICIAN ASSIGNED       Date/Time Event User Comments    06/24/22 1537 Physician Assigned Hilliard Clark, MD assigned as Attending             HISTORY OF PRESENT ILLNESS   Translator Used : No    Chief Complaint: Abdominal Pain and Emesis       31 y.o. female with a history of asthma and migraines, presents to the ER with abdominal pain and vomiting. She states she was having right lower quadrant pain that radiated to her right lower back. She states the pain has been occurring for the past week. She rates the pain a 9/10, sharp pain that was cramping and stabbing. She states the pain was intermittent at first but then became constant. She notes she was also having vomiting which started after the pain. She says she vomited about 2-3 times per day for the past week. She notes she had diarrhea yesterday for one day. No fevers, no chills, no dysuria, no hematuria. LMP 04/16/22. She has been taking ibuprofen for the pain as well as promethazine for nausea.     Independent Historian (other than patient): No  Additional History Provided by Independent Historian:  MEDICAL HISTORY     Past Medical History:  Past Medical History:   Diagnosis Date    Asthma     Headache        Past Surgical History:  Past Surgical History:   Procedure Laterality Date    COLPOSCOPY  2018    TONSILLECTOMY  2019       Social History:  Social History     Socioeconomic History    Marital status: Married   Tobacco Use    Smoking status: Former     Types: Cigars     Quit date: 2019     Years since quitting: 5.0    Smokeless tobacco: Never   Vaping Use    Vaping Use: Never used   Substance and Sexual Activity    Alcohol use: Not Currently    Drug use: Never    Sexual activity: Yes     Partners: Male     Birth control/protection: None     Social Determinants of Health     Food Insecurity: No Food Insecurity (06/24/2022)    Hunger Vital Sign     Worried  About Running Out of Food in the Last Year: Never true     Ran Out of Food in the Last Year: Never true   Intimate Partner Violence: Not At Risk (06/24/2022)    Humiliation, Afraid, Rape, and Kick questionnaire     Fear of Current or Ex-Partner: No     Emotionally Abused: No     Physically Abused: No     Sexually Abused: No       Family History:  History reviewed. No pertinent family history.    Outpatient Medication:  Discharge Medication List as of 06/24/2022  8:00 PM        CONTINUE these medications which have NOT CHANGED    Details   albuterol sulfate HFA (PROVENTIL) 108 (90 Base) MCG/ACT inhaler Inhale 2 puffs into the lungs every 6 (six) hours as needed for Wheezing, Historical Med      butalbital-acetaminophen-caffeine (FIORICET) 50-325-40 MG per tablet Take 1 tablet by mouth every 4 (four) hours as needed for Headaches, Starting Sat 01/16/2022,  E-Rx      DIPHENHYDRAMINE HCL PO Take by mouth, Historical Med      docusate sodium (COLACE) 100 MG capsule Take 1 capsule (100 mg total) by mouth daily, Starting Mon 04/21/2020, No Print      famotidine (PEPCID) 20 MG tablet Take 20 mg by mouth every 12 (twelve) hours, Historical Med      hydrocortisone 1 % ointment Apply topically 3 (three) times daily as needed (perineal care), Starting Sun 04/20/2020, No Print      !! ibuprofen (ADVIL) 800 MG tablet Take 1 tablet (800 mg total) by mouth every 8 (eight) hours, Starting Sun 04/20/2020, No Print      lanolin ointment Apply topically every 2 (two) hours as needed for Dry Skin (nipple discomfort), Starting Sun 04/20/2020, No Print      norgestimate-ethinyl estradiol (ORTHO-CYCLEN) 0.25-35 MG-MCG per tablet Take 1 tablet by mouth daily, Historical Med      !! ondansetron (ZOFRAN-ODT) 4 MG disintegrating tablet Take 1 tablet (4 mg) by mouth every 6 (six) hours as needed for Nausea, Starting Thu 03/04/2022, E-Rx      Prenatal MV-Min-Fe Fum-FA-DHA (PRENATAL 1 PO) Take by mouth, Historical Med      promethazine (PHENERGAN) 25  MG tablet Take 1 tablet (25 mg) by mouth every 6 (six) hours as needed for Nausea, Starting Fri 03/12/2022, E-Rx      senna-docusate (PERICOLACE) 8.6-50 MG per tablet Take 1 tablet by mouth nightly as needed for Constipation, Starting Sun 04/20/2020, No Print       !! - Potential duplicate medications found. Please discuss with provider.            REVIEW OF SYSTEMS   Review of Systems See History of Present Illness  PHYSICAL EXAM     ED Triage Vitals [06/24/22 1520]   Enc Vitals Group      BP (!) 138/95      Heart Rate 90      Resp Rate 17      Temp 98.3 F (36.8 C)      Temp Source Temporal      SpO2 99 %      Weight 118 kg      Height 1.626 m      Head Circumference       Peak Flow       Pain Score 8      Pain Loc       Pain Edu?       Excl. in GC?      Physical Exam   General: Well developed, well nourished obese female appears comfortable  Head: Normocephalic, atraumatic.  Eyes: Extra-ocular motions intact. Pupils are equal and round and reactive to light bilaterally.   ENT: Moist mucus membranes  Respiratory: No respiratory distress. Lungs are clear to auscultation bilaterally with good air exchange.  CV: Regular rate. No murmur, gallop or rub.   Abdomen: Soft, supple and not distended. + tenderness to palpation in the RLQ, no rebound, no guarding. Bowel sounds are present and normal.   Back: no C-L spine tenderness to palpation, no step offs.   MSK: No swelling, deformity or cyanosis of the upper or lower extremities bilaterally.   Neuro: Awake, alert, moving all extremities equally and spontaneously   Skin: Normal color. Warm and dry.  Psych: Normal behavior     MEDICAL DECISION MAKING     PRIMARY PROBLEM LIST      Acute illness/injury with risk to life or bodily  function (based on differential diagnosis or evaluation) DIAGNOSIS: RLQ pain , nausea and vomiting   Chronic Illness Impacting Care of the above problem: Asthma and Obesity Increases complexity of evaluation, Increases the risk of severe disease,  and Increase the risk of disease progression      DISCUSSION      31 year old female, with a history of asthma and migraines presents to the ER with RLQ pain that radiates to her lower back that has been occurring for 1 week. She appears well, vitals are within normal limits. Concern for colitis, diverticulitis, appendicitis or ovarian pathology. Patient says she has been getting evaluated by her OBGYN for this pain intermittently but it has never been constant as it is now. Plan for labs, UA and CT scan of the abdomen /pelvis. Patient may need and ultrasound after.     If patient is being hospitalized is severe sepsis or septic shock suspected?: N/A          External Records Reviewed?: [place] Prescription Drug Monitor Reviewed, and no concerning prescriptions noted.    Additional Notes    Diagnostic test considered and not performed: Patient declined after risk/benefit discussed.  Patient fully understands risk/benefits.                 Vital Signs: Reviewed the patient's vital signs.   Nursing Notes: Reviewed and utilized available nursing notes.  Medical Records Reviewed: Reviewed available past medical records.  Counseling: The emergency provider has spoken with the patient and discussed today's findings, in addition to providing specific details for the plan of care.  Questions are answered and there is agreement with the plan.      7:50 PM Labs were reassuring. CT showed normal appendix, She does have hepatomegaly which was seen on prior CT scan. Offered [place] but she declines and will follow up with OBGYN. She notes her pain and nausea are better and she is ready for [place] home. Advised following up with GI and OBGYN. All questions answered, patient discharged home    RADIOLOGY IMAGING STUDIES      CT Abd/Pelvis with IV Contrast only   Final Result      1. Normal appendix. No acute abnormality seen. No significant interval   change compared to prior study. Again the liver is enlarged.      Charlene Brooke, MD    06/24/2022 5:49 PM          EMERGENCY DEPT. MEDICATIONS      ED Medication Orders (From admission, onward)      Start Ordered     Status Ordering Provider    06/24/22 1707 06/24/22 1707  iohexol (OMNIPAQUE) 350 MG/ML injection 100 mL  IMG once as needed        Route: Intravenous  Ordered Dose: 100 mL       Last MAR action: Imaging Agent Given Olin Hauser    06/24/22 1538 06/24/22 1537  sodium chloride 0.9 % bolus 1,000 mL  Once        Route: Intravenous  Ordered Dose: 1,000 mL       Last MAR action: Stopped Olin Hauser    06/24/22 1538 06/24/22 1538  ondansetron (ZOFRAN) injection 4 mg  Once        Route: Intravenous  Ordered Dose: 4 mg       Last MAR action: Given Olin Hauser    06/24/22 1538 06/24/22 1538  morphine injection 4 mg  Once  Route: Intravenous  Ordered Dose: 4 mg       Last MAR action: Given Erol Flanagin M            LABORATORY RESULTS    Ordered and independently interpreted AVAILABLE laboratory tests.   Results       Procedure Component Value Units Date/Time    Comprehensive metabolic panel [[id number]]  (Abnormal) Collected: 06/24/22 1623    Specimen: Blood Updated: 06/24/22 1651     Glucose 86 mg/dL      BUN 8.0 mg/dL      Creatinine 0.8 mg/dL      Sodium 409 mEq/L      Potassium 3.5 mEq/L      Chloride 104 mEq/L      CO2 22 mEq/L      Calcium 8.3 mg/dL      Protein, Total 7.6 g/dL      Albumin 3.5 g/dL      AST (SGOT) 15 U/L      ALT 11 U/L      Alkaline Phosphatase 91 U/L      Bilirubin, Total 0.5 mg/dL      Globulin 4.1 g/dL      Albumin/Globulin Ratio 0.9     Anion Gap 8.0     eGFR >60.0 mL/min/1.73 m2     Lipase [[id number]] Collected: 06/24/22 1623    Specimen: Blood Updated: 06/24/22 1651     Lipase 25 U/L     Beta HCG, Qual, Serum [[id number]] Collected: 06/24/22 1623    Specimen: Blood Updated: 06/24/22 1643     Hcg Qualitative Negative    Urinalysis Reflex to Microscopic Exam- Reflex to Culture [[id number]]  (Abnormal) Collected: 06/24/22 1613     Updated:  06/24/22 1642     Urine Type Urine, Clean Ca     Color, UA Straw     Clarity, UA Clear     Specific Gravity UA 1.021     Urine pH 7.0     Leukocyte Esterase, UA Negative     Nitrite, UA Negative     Protein, UR 20=Trace     Glucose, UA Negative     Ketones UA Negative     Urobilinogen, UA Normal mg/dL      Bilirubin, UA Negative     Blood, UA Negative     RBC, UA 0-2 /hpf      WBC, UA 0-5 /hpf      Squamous Epithelial Cells, Urine 0-5 /hpf      Urine Mucus Present    CBC and differential [[id number]] Collected: 06/24/22 1623    Specimen: Blood Updated: 06/24/22 1640     WBC 5.30 x10 3/uL      Hgb 12.7 g/dL      Hematocrit 01.0 %      Platelets 251 x10 3/uL      RBC 4.05 x10 6/uL      MCV 91.6 fL      MCH 31.4 pg      MCHC 34.2 g/dL      RDW 12 %      MPV 8.9 fL      Instrument Absolute Neutrophil Count 1.98 x10 3/uL      Neutrophils 37.3 %      Lymphocytes Automated 46.6 %      Monocytes 12.5 %      Eosinophils Automated 2.6 %      Basophils Automated 0.8 %      Immature Granulocytes 0.2 %  Nucleated RBC 0.0 /100 WBC      Neutrophils Absolute 1.98 x10 3/uL      Lymphocytes Absolute Automated 2.47 x10 3/uL      Monocytes Absolute Automated 0.66 x10 3/uL      Eosinophils Absolute Automated 0.14 x10 3/uL      Basophils Absolute Automated 0.04 x10 3/uL      Immature Granulocytes Absolute 0.01 x10 3/uL      Absolute NRBC 0.00 x10 3/uL               CRITICAL CARE/PROCEDURES    Procedures    DIAGNOSIS      Diagnosis:  Final diagnoses:   RLQ abdominal pain   Nausea and vomiting, unspecified vomiting type       Disposition:  ED Disposition       ED Disposition   Discharge    Condition   --    Date/Time   Thu Jun 24, 2022  7:50 PM    Comment   Adelayde Minney Maish discharge to home/self care.    Condition at disposition: Stable                 Prescriptions:  Discharge Medication List as of 06/24/2022  8:00 PM        START taking these medications    Details   !! ibuprofen (ADVIL) 600 MG tablet Take 1 tablet (600 mg) by  mouth every 8 (eight) hours as needed for Pain, Starting Thu 06/24/2022, E-Rx      !! ondansetron (ZOFRAN-ODT) 4 MG disintegrating tablet Take 1 tablet (4 mg) by mouth every 6 (six) hours as needed for Nausea, Starting Thu 06/24/2022, E-Rx       !! - Potential duplicate medications found. Please discuss with provider.        CONTINUE these medications which have NOT CHANGED    Details   albuterol sulfate HFA (PROVENTIL) 108 (90 Base) MCG/ACT inhaler Inhale 2 puffs into the lungs every 6 (six) hours as needed for Wheezing, Historical Med      butalbital-acetaminophen-caffeine (FIORICET) 50-325-40 MG per tablet Take 1 tablet by mouth every 4 (four) hours as needed for Headaches, Starting Sat 01/16/2022, E-Rx      DIPHENHYDRAMINE HCL PO Take by mouth, Historical Med      docusate sodium (COLACE) 100 MG capsule Take 1 capsule (100 mg total) by mouth daily, Starting Mon 04/21/2020, No Print      famotidine (PEPCID) 20 MG tablet Take 20 mg by mouth every 12 (twelve) hours, Historical Med      hydrocortisone 1 % ointment Apply topically 3 (three) times daily as needed (perineal care), Starting Sun 04/20/2020, No Print      !! ibuprofen (ADVIL) 800 MG tablet Take 1 tablet (800 mg total) by mouth every 8 (eight) hours, Starting Sun 04/20/2020, No Print      lanolin ointment Apply topically every 2 (two) hours as needed for Dry Skin (nipple discomfort), Starting Sun 04/20/2020, No Print      norgestimate-ethinyl estradiol (ORTHO-CYCLEN) 0.25-35 MG-MCG per tablet Take 1 tablet by mouth daily, Historical Med      !! ondansetron (ZOFRAN-ODT) 4 MG disintegrating tablet Take 1 tablet (4 mg) by mouth every 6 (six) hours as needed for Nausea, Starting Thu 03/04/2022, E-Rx      Prenatal MV-Min-Fe Fum-FA-DHA (PRENATAL 1 PO) Take by mouth, Historical Med      promethazine (PHENERGAN) 25 MG tablet Take 1 tablet (25 mg) by mouth every 6 (six)  hours as needed for Nausea, Starting Fri 03/12/2022, E-Rx      senna-docusate (PERICOLACE) 8.6-50 MG  per tablet Take 1 tablet by mouth nightly as needed for Constipation, Starting Sun 04/20/2020, No Print       !! - Potential duplicate medications found. Please discuss with provider.              This note was generated by the Epic EMR system/ Dragon speech recognition and may contain inherent errors or omissions not intended by the user. Grammatical errors, random word insertions, deletions and pronoun errors  are occasional consequences of this technology due to software limitations. Not all errors are caught or corrected. If there are questions or concerns about the content of this note or information contained within the body of this dictation they should be addressed directly with the author for clarification.           Olin Hauser, MD  06/27/22 1316

## 2022-09-29 ENCOUNTER — Emergency Department
Admission: EM | Admit: 2022-09-29 | Discharge: 2022-09-29 | Disposition: A | Discharge status: Home / Self Care | Payer: Commercial Managed Care - POS | Attending: Emergency Medicine | Admitting: Emergency Medicine

## 2022-09-29 ENCOUNTER — Emergency Department: Payer: Commercial Managed Care - POS

## 2022-09-29 DIAGNOSIS — N8302 Follicular cyst of left ovary: Secondary | ICD-10-CM | POA: Insufficient documentation

## 2022-09-29 DIAGNOSIS — R1032 Left lower quadrant pain: Secondary | ICD-10-CM | POA: Insufficient documentation

## 2022-09-29 DIAGNOSIS — E282 Polycystic ovarian syndrome: Secondary | ICD-10-CM | POA: Insufficient documentation

## 2022-09-29 DIAGNOSIS — N83202 Unspecified ovarian cyst, left side: Secondary | ICD-10-CM

## 2022-09-29 LAB — URINALYSIS WITH REFLEX TO MICROSCOPIC EXAM - REFLEX TO CULTURE
Bilirubin, UA: NEGATIVE
Glucose, UA: NEGATIVE
Ketones UA: NEGATIVE
Leukocyte Esterase, UA: NEGATIVE
Nitrite, UA: NEGATIVE
Specific Gravity UA: 1.029 (ref 1.001–1.035)
Urine pH: 6 (ref 5.0–8.0)
Urobilinogen, UA: NORMAL mg/dL (ref 0.2–2.0)

## 2022-09-29 LAB — CBC AND DIFFERENTIAL
Absolute NRBC: 0 10*3/uL (ref 0.00–0.00)
Basophils Absolute Automated: 0.04 10*3/uL (ref 0.00–0.08)
Basophils Automated: 0.9 %
Eosinophils Absolute Automated: 0.08 10*3/uL (ref 0.00–0.44)
Eosinophils Automated: 1.7 %
Hematocrit: 35.5 % (ref 34.7–43.7)
Hgb: 12.5 g/dL (ref 11.4–14.8)
Immature Granulocytes Absolute: 0.01 10*3/uL (ref 0.00–0.07)
Immature Granulocytes: 0.2 %
Instrument Absolute Neutrophil Count: 2 10*3/uL (ref 1.10–6.33)
Lymphocytes Absolute Automated: 2.14 10*3/uL (ref 0.42–3.22)
Lymphocytes Automated: 45.9 %
MCH: 32.5 pg (ref 25.1–33.5)
MCHC: 35.2 g/dL (ref 31.5–35.8)
MCV: 92.2 fL (ref 78.0–96.0)
MPV: 8.6 fL — ABNORMAL LOW (ref 8.9–12.5)
Monocytes Absolute Automated: 0.39 10*3/uL (ref 0.21–0.85)
Monocytes: 8.4 %
Neutrophils Absolute: 2 10*3/uL (ref 1.10–6.33)
Neutrophils: 42.9 %
Nucleated RBC: 0 /100 WBC (ref 0.0–0.0)
Platelets: 267 10*3/uL (ref 142–346)
RBC: 3.85 10*6/uL — ABNORMAL LOW (ref 3.90–5.10)
RDW: 12 % (ref 11–15)
WBC: 4.66 10*3/uL (ref 3.10–9.50)

## 2022-09-29 LAB — COMPREHENSIVE METABOLIC PANEL
ALT: 12 U/L (ref 0–55)
AST (SGOT): 15 U/L (ref 5–41)
Albumin/Globulin Ratio: 1.1 (ref 0.9–2.2)
Albumin: 3.7 g/dL (ref 3.5–5.0)
Alkaline Phosphatase: 77 U/L (ref 37–117)
Anion Gap: 9 (ref 5.0–15.0)
BUN: 13 mg/dL (ref 7.0–21.0)
Bilirubin, Total: 0.3 mg/dL (ref 0.2–1.2)
CO2: 22 mEq/L (ref 17–29)
Calcium: 8.6 mg/dL (ref 8.5–10.5)
Chloride: 109 mEq/L (ref 99–111)
Creatinine: 0.9 mg/dL (ref 0.4–1.0)
Globulin: 3.3 g/dL (ref 2.0–3.6)
Glucose: 91 mg/dL (ref 70–100)
Potassium: 4.2 mEq/L (ref 3.5–5.3)
Protein, Total: 7 g/dL (ref 6.0–8.3)
Sodium: 140 mEq/L (ref 135–145)
eGFR: >60 mL/min/{1.73_m2} (ref >=60)

## 2022-09-29 LAB — URINE HCG QUALITATIVE: Urine HCG Qualitative: NEGATIVE

## 2022-09-29 MED ORDER — KETOROLAC TROMETHAMINE 30 MG/ML IJ SOLN
15.0000 mg | Freq: Once | INTRAMUSCULAR | Status: AC
Start: 2022-09-29 — End: 2022-09-29
  Administered 2022-09-29: 15 mg via INTRAVENOUS
  Filled 2022-09-29: qty 1

## 2022-09-29 MED ORDER — SODIUM CHLORIDE 0.9 % IV BOLUS
1000.0000 mL | Freq: Once | INTRAVENOUS | Status: AC
Start: 2022-09-29 — End: 2022-09-29
  Administered 2022-09-29: 1000 mL via INTRAVENOUS

## 2022-09-29 MED ORDER — ACETAMINOPHEN 325 MG PO TABS
650.0000 mg | ORAL_TABLET | Freq: Once | ORAL | Status: AC
Start: 2022-09-29 — End: 2022-09-29
  Administered 2022-09-29: 650 mg via ORAL
  Filled 2022-09-29: qty 2

## 2022-09-29 MED ORDER — METOCLOPRAMIDE HCL 10 MG PO TABS
10.0000 mg | ORAL_TABLET | Freq: Three times a day (TID) | ORAL | 0 refills | Status: DC | PRN
Start: 2022-09-29 — End: 2022-12-06

## 2022-09-29 MED ORDER — METOCLOPRAMIDE HCL 5 MG/ML IJ SOLN
10.0000 mg | Freq: Once | INTRAMUSCULAR | Status: DC
Start: 2022-09-29 — End: 2022-09-29
  Filled 2022-09-29: qty 2

## 2022-09-29 MED ORDER — IBUPROFEN 800 MG PO TABS
800.0000 mg | ORAL_TABLET | Freq: Three times a day (TID) | ORAL | 0 refills | Status: DC | PRN
Start: 2022-09-29 — End: 2022-10-24

## 2022-09-29 NOTE — EDIE (Signed)
[facility]?NOTIFICATION?09/29/2022 07:39?TYNIAH, HONTS S?MRN: [id number]    Criteria Met      5 ED Visits in 12 Months    Security and Safety  No Security Events were found.  ED Care Guidelines  There are currently no ED Care Guidelines for this patient. Please check your facility's medical records system.        Prescription Drug Data  No Prescription Drug Data was found.    E.D. Visit Count (12 mo.)  Facility Visits   [place] [facility]: [facility] 6   Total 6   Note: Visits indicate total known visits.     Recent Emergency Department Visit Summary  Date Facility [facility] Type Diagnoses or Chief Complaint    Sep 29, 2022  [place] [facility]: [facility].  [place]  Emergency      Abdominal Pain      Jun 24, 2022  [place] [facility]: [facility].  [place]  Emergency      Right lower quadrant pain      Nausea with vomiting, unspecified      Emesis      Abdominal Pain      Mar 12, 2022  [place] [facility]: [facility].  [place]  Emergency      Dehydration      Headache, unspecified      Pelvic and perineal pain      Nausea with vomiting, unspecified      Nausea      Headache      vomiting/weakness      Mar 04, 2022  [place] [facility]: [facility].  [place]  Emergency      Dysmenorrhea, unspecified      Pelvic Pain      abdominal pain; emesis; headache      Jan 16, 2022  [place] [facility]: [facility].  [place]  Emergency      Chest pain, unspecified      Headache, unspecified      Headache      high blood pressure/headache      Jan 10, 2022  [place] [facility]: [facility].  [place]  Emergency      Elevated blood-pressure reading, without diagnosis of hypertension      Acute pharyngitis, unspecified      Acute laryngitis      Mild intermittent asthma with (acute) exacerbation      Sore Throat      Sorethroat,cough        Recent Inpatient Visit Summary  No Recent Inpatient Visits were found.  Care Team  No Care Team was  found.  [facility]  This patient has registered at the [facility] Emergency Room: [facility] Emergency Department  For more information visit: [email or web address]     PLEASE NOTE:     1.   Any care recommendations and other clinical information are provided as guidelines or for historical purposes only, and providers should exercise their own clinical judgment when providing care.    2.   You may only use this information for purposes of treatment, payment or health care operations activities, and subject to the limitations of applicable [facility] Policies.    3.   You should consult directly with the organization that provided a care guideline or other clinical history with any questions about additional information or accuracy or completeness of information provided.    ? [id number] [facility] -  [email or web address]

## 2022-09-29 NOTE — ED Provider Notes (Addendum)
EMERGENCY DEPARTMENT NOTE     Patient initially seen and examined at   ED PHYSICIAN ASSIGNED       Date/Time Event User Comments    09/29/22 0740 Physician Assigned Sayda Grable, Jamse Belfast, Yetter Rochester, MD assigned as Attending           ED MIDLEVEL (APP) ASSIGNED       None            HISTORY OF PRESENT ILLNESS       Chief Complaint: No chief complaint on file.       32 y.o. female with past medical history as below 32 year old female complaining of acute onset left lower quadrant pain x 3 days worsening in the past 6 hours will point where she was doubled over and woken out of sleep.  Patient started her period about 2 days ago and has history of multiple ovarian cysts but never required surgery or had pain this bad.  She has taken Motrin over-the-counter with some pain relief but the pain is still worse so decided to come in to get evaluated she is told her primary doctor about this in the past and they have tried hormone stabilization but she apparently has issues with her blood pressure that worsened when she is on oral replacement so she has been taken off it.    Patient notes nausea no vomiting no fevers chills shortness of breath      Physical Examination: General appearance - alert, well appearing, and in no distress  Mental status - alert, oriented to person, place, and time  Morbid obesity  Eyes - pupils equal and reactive, extraocular eye movements intact  Nose - normal and patent, no discharge  Neck - grossly normal rom  Chest - clear to auscultation, no wheezes, rales or rhonchi, symmetric air entry  Heart - normal rate, regular rhythm  Neurological - maex4, speech clear  No rebound left lower quadrant slightly tender no CVA tenderness no change with leg lifts to 90 degrees  NIHSS-0, Grossly normal swallow study as patient handles secretions well  Musculoskeletal - no joint tenderness, deformity or swelling  Extremities - distal blood flow grossly normal, no pedal edema  Skin - normal coloration, no  rashes where visualized   Psychiatric- alert, oriented, anxious  Negative orthostatic normal gait    Denies vaginal complaint discharge or bleeding    Review of Systems   The patient denies any other associated symptoms, complaints, or concerns not otherwise listed above.    The patient verbalizes understanding of the test results, short and long term treatment plan, indications to return to emergency department, need for immediate follow up, and possible medication side effects. The patient has decided to be discharged home at this time.    Symptoms improved after treatment but still has persistent pain    NB: This encounter occurred during the pandemic and was managed under crisis standards of care which may not include the same standard of care that is present under non-pandemic conditions such as alternative treatment, diagnostic testing, throughput times, and admission criteria and goals. We tirelessly strive to maintain the highest standard of care of compassionate care during this crisis but resource, interventions, and standards have been severely limited and delayed due to the pandemic.      Independent Historian (other than patient): No  Additional History Provided by Independent Historian:  MEDICAL HISTORY     Past Medical History:  Past Medical History:   Diagnosis Date  Asthma     Headache        Past Surgical History:  Past Surgical History:   Procedure Laterality Date    COLPOSCOPY  2018    TONSILLECTOMY  2019       Social History:  Social History     Socioeconomic History    Marital status: Married   Tobacco Use    Smoking status: Former     Types: Cigars     Quit date: 2019     Years since quitting: 5.2    Smokeless tobacco: Never   Vaping Use    Vaping status: Never Used   Substance and Sexual Activity    Alcohol use: Not Currently    Drug use: Never    Sexual activity: Yes     Partners: Male     Birth control/protection: None     Social Determinants of Health     Food Insecurity: No Food  Insecurity (06/24/2022)    Hunger Vital Sign     Worried About Running Out of Food in the Last Year: Never true     Ran Out of Food in the Last Year: Never true   Transportation Needs: No Transportation Needs (12/26/2019)    Received from [facility] - Transportation     Lack of Transportation (Medical): No     Lack of Transportation (Non-Medical): No   Intimate Partner Violence: Not At Risk (06/24/2022)    Humiliation, Afraid, Rape, and Kick questionnaire     Fear of Current or Ex-Partner: No     Emotionally Abused: No     Physically Abused: No     Sexually Abused: No       Family History:  No family history on file.    Outpatient Medication:  Previous Medications    ALBUTEROL SULFATE HFA (PROVENTIL) 108 (90 BASE) MCG/ACT INHALER    Inhale 2 puffs into the lungs every 6 (six) hours as needed for Wheezing    BUTALBITAL-ACETAMINOPHEN-CAFFEINE (FIORICET) 50-325-40 MG PER TABLET    Take 1 tablet by mouth every 4 (four) hours as needed for Headaches    DIPHENHYDRAMINE HCL PO    Take by mouth    DOCUSATE SODIUM (COLACE) 100 MG CAPSULE    Take 1 capsule (100 mg total) by mouth daily    FAMOTIDINE (PEPCID) 20 MG TABLET    Take 20 mg by mouth every 12 (twelve) hours    HYDROCORTISONE 1 % OINTMENT    Apply topically 3 (three) times daily as needed (perineal care)    IBUPROFEN (ADVIL) 600 MG TABLET    Take 1 tablet (600 mg) by mouth every 8 (eight) hours as needed for Pain    IBUPROFEN (ADVIL) 800 MG TABLET    Take 1 tablet (800 mg total) by mouth every 8 (eight) hours    LANOLIN OINTMENT    Apply topically every 2 (two) hours as needed for Dry Skin (nipple discomfort)    NORGESTIMATE-ETHINYL ESTRADIOL (ORTHO-CYCLEN) 0.25-35 MG-MCG PER TABLET    Take 1 tablet by mouth daily    ONDANSETRON (ZOFRAN-ODT) 4 MG DISINTEGRATING TABLET    Take 1 tablet (4 mg) by mouth every 6 (six) hours as needed for Nausea    ONDANSETRON (ZOFRAN-ODT) 4 MG DISINTEGRATING TABLET    Take 1 tablet (4 mg) by mouth every 6 (six) hours as needed  for Nausea    PRENATAL MV-MIN-FE FUM-FA-DHA (PRENATAL 1 PO)    Take by mouth  PROMETHAZINE (PHENERGAN) 25 MG TABLET    Take 1 tablet (25 mg) by mouth every 6 (six) hours as needed for Nausea    SENNA-DOCUSATE (PERICOLACE) 8.6-50 MG PER TABLET    Take 1 tablet by mouth nightly as needed for Constipation         REVIEW OF SYSTEMS   Review of Systems See History of Present Illness  PHYSICAL EXAM     ED Triage Vitals   Enc Vitals Group      BP       Pulse       Resp       Temp       Temp src       SpO2       Weight       Height       Head Circumference       Peak Flow       Pain Score       Pain Loc       Pain Edu?       Excl. in [place]?      Physical Exam     MEDICAL DECISION MAKING     PRIMARY PROBLEM LIST      Acute illness/injury with risk to life or bodily function (based on differential diagnosis or evaluation) DIAGNOSIS: Abdominal pain  Chronic Illness Impacting Care of the above problem: Obesity Increases complexity of evaluation  Differential Diagnosis: Abdominal Pain: bowel obstruction, abscess, colitis, diverticulitis, appendicitis, peritonitis, bowel perforation, mesenteric ischemia, abdominal aortic aneurysm, cholecystitis, biliay colic, cholangitis     DISCUSSION          If patient is being hospitalized is severe sepsis or septic shock suspected?: N/A          External Records Reviewed?: Physician Office Records    Additional Notes                     Vital Signs: Reviewed the patient's vital signs.   Nursing Notes: Reviewed and utilized available nursing notes.  Medical Records Reviewed: Reviewed available past medical records.  Counseling: The emergency provider has spoken with the patient and discussed today's findings, in addition to providing specific details for the plan of care.  Questions are answered and there is agreement with the plan.      MIPS DOCUMENTATION              CARDIAC STUDIES    The following cardiac studies were independently interpreted by me the Emergency Medicine Provider.  For  full cardiac study results please see chart.                                                EMERGENCY IMAGING STUDIES    The following imagine studies were independently interpreted by me (emergency medicine provider):       CT grossly normal with high stool burden                RADIOLOGY IMAGING STUDIES      No orders to display       EMERGENCY DEPT. MEDICATIONS      ED Medication Orders (From admission, onward)      None            LABORATORY RESULTS    Ordered and independently interpreted AVAILABLE laboratory tests.  Results       ** No results found for the last 24 hours. **              CRITICAL CARE/PROCEDURES    Procedures  Critical care?  DIAGNOSIS      Diagnosis:  Final diagnoses:   None       Disposition:  ED Disposition       None            Prescriptions:  Patient's Medications   New Prescriptions    No medications on file   Previous Medications    ALBUTEROL SULFATE HFA (PROVENTIL) 108 (90 BASE) MCG/ACT INHALER    Inhale 2 puffs into the lungs every 6 (six) hours as needed for Wheezing    BUTALBITAL-ACETAMINOPHEN-CAFFEINE (FIORICET) 50-325-40 MG PER TABLET    Take 1 tablet by mouth every 4 (four) hours as needed for Headaches    DIPHENHYDRAMINE HCL PO    Take by mouth    DOCUSATE SODIUM (COLACE) 100 MG CAPSULE    Take 1 capsule (100 mg total) by mouth daily    FAMOTIDINE (PEPCID) 20 MG TABLET    Take 20 mg by mouth every 12 (twelve) hours    HYDROCORTISONE 1 % OINTMENT    Apply topically 3 (three) times daily as needed (perineal care)    IBUPROFEN (ADVIL) 600 MG TABLET    Take 1 tablet (600 mg) by mouth every 8 (eight) hours as needed for Pain    IBUPROFEN (ADVIL) 800 MG TABLET    Take 1 tablet (800 mg total) by mouth every 8 (eight) hours    LANOLIN OINTMENT    Apply topically every 2 (two) hours as needed for Dry Skin (nipple discomfort)    NORGESTIMATE-ETHINYL ESTRADIOL (ORTHO-CYCLEN) 0.25-35 MG-MCG PER TABLET    Take 1 tablet by mouth daily    ONDANSETRON (ZOFRAN-ODT) 4 MG DISINTEGRATING TABLET     Take 1 tablet (4 mg) by mouth every 6 (six) hours as needed for Nausea    ONDANSETRON (ZOFRAN-ODT) 4 MG DISINTEGRATING TABLET    Take 1 tablet (4 mg) by mouth every 6 (six) hours as needed for Nausea    PRENATAL MV-MIN-FE FUM-FA-DHA (PRENATAL 1 PO)    Take by mouth    PROMETHAZINE (PHENERGAN) 25 MG TABLET    Take 1 tablet (25 mg) by mouth every 6 (six) hours as needed for Nausea    SENNA-DOCUSATE (PERICOLACE) 8.6-50 MG PER TABLET    Take 1 tablet by mouth nightly as needed for Constipation   Modified Medications    No medications on file   Discontinued Medications    No medications on file           This note was generated by the Epic EMR system/ Dragon speech recognition and may contain inherent errors or omissions not intended by the user. Grammatical errors, random word insertions, deletions and pronoun errors  are occasional consequences of this technology due to software limitations. Not all errors are caught or corrected. If there are questions or concerns about the content of this note or information contained within the body of this dictation they should be addressed directly with the author for clarification.           Jamse Belfast, MD  09/29/22 [id number]       Jamse Belfast, MD  09/29/22 [phone]

## 2022-09-29 NOTE — Discharge Instructions (Addendum)
MiraLAX 3 times a day as discussed and Motrin every 8 hours as needed

## 2022-09-29 NOTE — ED Notes (Signed)
Pt discharged home, NAD

## 2022-10-24 ENCOUNTER — Emergency Department: Payer: Commercial Managed Care - POS

## 2022-10-24 ENCOUNTER — Emergency Department
Admission: EM | Admit: 2022-10-24 | Discharge: 2022-10-24 | Disposition: A | Discharge status: Home / Self Care | Payer: Commercial Managed Care - POS | Attending: Emergency Medicine | Admitting: Emergency Medicine

## 2022-10-24 DIAGNOSIS — N946 Dysmenorrhea, unspecified: Secondary | ICD-10-CM | POA: Insufficient documentation

## 2022-10-24 DIAGNOSIS — N3090 Cystitis, unspecified without hematuria: Secondary | ICD-10-CM | POA: Insufficient documentation

## 2022-10-24 HISTORY — DX: Polycystic ovarian syndrome: E28.2

## 2022-10-24 LAB — URINALYSIS WITH REFLEX TO MICROSCOPIC EXAM - REFLEX TO CULTURE
Bilirubin, UA: NEGATIVE
Glucose, UA: NEGATIVE
Ketones UA: NEGATIVE
Nitrite, UA: NEGATIVE
Specific Gravity UA: 1.024 (ref 1.001–1.035)
Urine pH: 6 (ref 5.0–8.0)
Urobilinogen, UA: NORMAL mg/dL (ref 0.2–2.0)

## 2022-10-24 LAB — CBC AND DIFFERENTIAL
Absolute NRBC: 0 10*3/uL (ref 0.00–0.00)
Basophils Absolute Automated: 0.04 10*3/uL (ref 0.00–0.08)
Basophils Automated: 0.7 %
Eosinophils Absolute Automated: 0.08 10*3/uL (ref 0.00–0.44)
Eosinophils Automated: 1.3 %
Hematocrit: 36.3 % (ref 34.7–43.7)
Hgb: 12.3 g/dL (ref 11.4–14.8)
Immature Granulocytes Absolute: 0.01 10*3/uL (ref 0.00–0.07)
Immature Granulocytes: 0.2 %
Instrument Absolute Neutrophil Count: 2.81 10*3/uL (ref 1.10–6.33)
Lymphocytes Absolute Automated: 2.7 10*3/uL (ref 0.42–3.22)
Lymphocytes Automated: 44.5 %
MCH: 31.6 pg (ref 25.1–33.5)
MCHC: 33.9 g/dL (ref 31.5–35.8)
MCV: 93.3 fL (ref 78.0–96.0)
MPV: 8.4 fL — ABNORMAL LOW (ref 8.9–12.5)
Monocytes Absolute Automated: 0.43 10*3/uL (ref 0.21–0.85)
Monocytes: 7.1 %
Neutrophils Absolute: 2.81 10*3/uL (ref 1.10–6.33)
Neutrophils: 46.2 %
Nucleated RBC: 0 /100 WBC (ref 0.0–0.0)
Platelets: 286 10*3/uL (ref 142–346)
RBC: 3.89 10*6/uL — ABNORMAL LOW (ref 3.90–5.10)
RDW: 12 % (ref 11–15)
WBC: 6.07 10*3/uL (ref 3.10–9.50)

## 2022-10-24 LAB — COMPREHENSIVE METABOLIC PANEL
ALT: 11 U/L (ref 0–55)
AST (SGOT): 14 U/L (ref 5–41)
Albumin/Globulin Ratio: 1.1 (ref 0.9–2.2)
Albumin: 3.6 g/dL (ref 3.5–5.0)
Alkaline Phosphatase: 77 U/L (ref 37–117)
Anion Gap: 7 (ref 5.0–15.0)
BUN: 8 mg/dL (ref 7.0–21.0)
Bilirubin, Total: 0.3 mg/dL (ref 0.2–1.2)
CO2: 25 mEq/L (ref 17–29)
Calcium: 8.8 mg/dL (ref 8.5–10.5)
Chloride: 108 mEq/L (ref 99–111)
Creatinine: 0.8 mg/dL (ref 0.4–1.0)
Globulin: 3.4 g/dL (ref 2.0–3.6)
Glucose: 86 mg/dL (ref 70–100)
Potassium: 3.9 mEq/L (ref 3.5–5.3)
Protein, Total: 7 g/dL (ref 6.0–8.3)
Sodium: 140 mEq/L (ref 135–145)
eGFR: >60 mL/min/{1.73_m2} (ref >=60)

## 2022-10-24 LAB — LIPASE: Lipase: 24 U/L (ref 8–78)

## 2022-10-24 LAB — URINE HCG QUALITATIVE: Urine HCG Qualitative: NEGATIVE

## 2022-10-24 MED ORDER — IBUPROFEN 600 MG PO TABS
600.0000 mg | ORAL_TABLET | Freq: Four times a day (QID) | ORAL | 0 refills | Status: DC | PRN
Start: 2022-10-24 — End: 2022-12-06

## 2022-10-24 MED ORDER — CEPHALEXIN 500 MG PO CAPS
500.0000 mg | ORAL_CAPSULE | Freq: Two times a day (BID) | ORAL | 0 refills | Status: AC
Start: 2022-10-24 — End: 2022-10-31

## 2022-10-24 MED ORDER — MORPHINE SULFATE 4 MG/ML IJ/IV SOLN (WRAP)
4.0000 mg | Freq: Once | Status: AC
Start: 2022-10-24 — End: 2022-10-24
  Administered 2022-10-24: 4 mg via INTRAVENOUS
  Filled 2022-10-24: qty 1

## 2022-10-24 MED ORDER — ACETAMINOPHEN 325 MG PO TABS
650.0000 mg | ORAL_TABLET | Freq: Four times a day (QID) | ORAL | 0 refills | Status: AC | PRN
Start: 2022-10-24 — End: ?

## 2022-10-24 MED ORDER — KETOROLAC TROMETHAMINE 30 MG/ML IJ SOLN
15.0000 mg | Freq: Once | INTRAMUSCULAR | Status: AC
Start: 2022-10-24 — End: 2022-10-24
  Administered 2022-10-24: 15 mg via INTRAVENOUS
  Filled 2022-10-24: qty 1

## 2022-10-24 NOTE — ED Triage Notes (Addendum)
Patient with hx of PCOS presents with heavy vaginal bleeding, suprapubic cramps and intermittent nausea x 3 days. Patient reports replacing menstrual devices every hour, so she recently changed into menstrual diapers. 800mg  Ibuprofen taken 0400 this morning.

## 2022-10-24 NOTE — ED Provider Notes (Signed)
EMERGENCY DEPARTMENT ATTENDING PHYSICIAN NOTE     I performed the substantive portion of the MDM. For the problems addressed, I personally developed, reviewed, and/or approved the plan and assessment as documented by the APP.  Patient was seen by APP, see their note for further documentation of history and exam.    BRIEF HISTORY OF PRESENT ILLNESS AND ADDITIONAL EXAM FINDINGS     Chief Complaint: Abdominal Pain and Vaginal Bleeding       History per APP and nursing    32 y.o. female presents with CO vaginal bleeding and cramps and nausea.  Taking ibuprofen for pain    Triage Vitals:  ED Triage Vitals [10/24/22 1535]   Enc Vitals Group      BP (!) 166/115      Heart Rate 79      Resp Rate 19      Temp 98.3 F (36.8 C)      Temp Source Oral      SpO2 99 %      Weight 117.5 kg      Height       Head Circumference       Peak Flow       Pain Score 10      Pain Loc       Pain Edu?       Excl. in [place]?             MEDICAL DECISION MAKING   [place] to ro torsion  Possible UTI noted    Will [place] to follow up GYN ASAP    External Records Reviewed? : Physician Office Records GYN note from January noted    Vital Signs: Reviewed the patient's vital signs.   Nursing Notes: Reviewed and utilized available nursing notes.  Medical Records Reviewed: Reviewed available past medical records.      RADIOLOGY IMAGING STUDIES      US Pelvic with Transvaginal (r/o torsion)   Final Result         1. Unremarkable transabdominal/transvaginal pelvic sonography.      Launa Flight, MD   10/24/2022 5:47 PM          EMERGENCY DEPT. MEDICATIONS      ED Medication Orders (From admission, onward)      Start Ordered     Status Ordering Provider    10/24/22 1652 10/24/22 1651  ketorolac (TORADOL) injection 15 mg  Once        Route: Intravenous  Ordered Dose: 15 mg       Last MAR action: Given MUNTEANU, OCTAVIAN C    10/24/22 1652 10/24/22 1651  morphine injection 4 mg  Once        Route: Intravenous  Ordered Dose: 4 mg       Last MAR action: Given MUNTEANU,  OCTAVIAN C            LABORATORY RESULTS    Ordered and independently interpreted AVAILABLE laboratory tests.   Results       Procedure Component Value Units Date/Time    Urinalysis Reflex to Microscopic Exam- Reflex to Culture [[id number]]  (Abnormal) Collected: 10/24/22 1654     Updated: 10/24/22 1737     Urine Type Urine, Clean Ca     Color, UA Yellow     Clarity, UA Hazy     Specific Gravity UA 1.024     Urine pH 6.0     Leukocyte Esterase, UA Trace     Nitrite, UA Negative  Protein, UR 30=1+     Glucose, UA Negative     Ketones UA Negative     Urobilinogen, UA Normal mg/dL      Bilirubin, UA Negative     Blood, UA Large     RBC, UA TNTC /hpf      WBC, UA 11-25 /hpf      Squamous Epithelial Cells, Urine 0-5 /hpf      Urine Mucus Present    Urine HCG Qualitative [[id number]] Collected: 10/24/22 1654    Specimen: Urine Updated: 10/24/22 1736     Urine HCG Qualitative Negative    Comprehensive metabolic panel [[id number]] Collected: 10/24/22 1427    Specimen: Blood Updated: 10/24/22 1702     Glucose 86 mg/dL      BUN 8.0 mg/dL      Creatinine 0.8 mg/dL      Sodium 742 mEq/L      Potassium 3.9 mEq/L      Chloride 108 mEq/L      CO2 25 mEq/L      Calcium 8.8 mg/dL      Protein, Total 7.0 g/dL      Albumin 3.6 g/dL      AST (SGOT) 14 U/L      ALT 11 U/L      Alkaline Phosphatase 77 U/L      Bilirubin, Total 0.3 mg/dL      Globulin 3.4 g/dL      Albumin/Globulin Ratio 1.1     Anion Gap 7.0     eGFR >60.0 mL/min/1.73 m2     Lipase [[id number]] Collected: 10/24/22 1427    Specimen: Blood Updated: 10/24/22 1702     Lipase 24 U/L     CBC and differential [[id number]]  (Abnormal) Collected: 10/24/22 1630    Specimen: Blood Updated: 10/24/22 1642     WBC 6.07 x10 3/uL      Hgb 12.3 g/dL      Hematocrit 41.6 %      Platelets 286 x10 3/uL      RBC 3.89 x10 6/uL      MCV 93.3 fL      MCH 31.6 pg      MCHC 33.9 g/dL      RDW 12 %      MPV 8.4 fL      Instrument Absolute Neutrophil Count 2.81 x10 3/uL      Neutrophils 46.2 %       Lymphocytes Automated 44.5 %      Monocytes 7.1 %      Eosinophils Automated 1.3 %      Basophils Automated 0.7 %      Immature Granulocytes 0.2 %      Nucleated RBC 0.0 /100 WBC      Neutrophils Absolute 2.81 x10 3/uL      Lymphocytes Absolute Automated 2.70 x10 3/uL      Monocytes Absolute Automated 0.43 x10 3/uL      Eosinophils Absolute Automated 0.08 x10 3/uL      Basophils Absolute Automated 0.04 x10 3/uL      Immature Granulocytes Absolute 0.01 x10 3/uL      Absolute NRBC 0.00 x10 3/uL               CRITICAL CARE/PROCEDURES        DIAGNOSIS    I (ED Physician) discussed final disposition with the APP    Diagnosis:  Final diagnoses:   Cystitis   Dysmenorrhea       Disposition:  ED Disposition       ED Disposition   Discharge    Condition   --    Date/Time   Sun Oct 24, 2022  7:15 PM    Comment   Aranza Geddes Credeur discharge to home/self care.    Condition at disposition: Stable                 Prescriptions:  Discharge Medication List as of 10/24/2022  7:15 PM        START taking these medications    Details   acetaminophen (TYLENOL) 325 MG tablet Take 2 tablets (650 mg) by mouth every 6 (six) hours as needed for Pain, Starting Sun 10/24/2022, E-Rx      cephALEXin (KEFLEX) 500 MG capsule Take 1 capsule (500 mg) by mouth 2 (two) times daily for 7 days, Starting Sun 10/24/2022, Until Sun 10/31/2022, E-Rx           CONTINUE these medications which have CHANGED    Details   ibuprofen (ADVIL) 600 MG tablet Take 1 tablet (600 mg) by mouth every 6 (six) hours as needed for Pain or Fever, Starting Sun 10/24/2022, E-Rx           CONTINUE these medications which have NOT CHANGED    Details   albuterol sulfate HFA (PROVENTIL) 108 (90 Base) MCG/ACT inhaler Inhale 2 puffs into the lungs every 6 (six) hours as needed for Wheezing, Historical Med      amLODIPine (NORVASC) 2.5 MG tablet Take 1 tablet (2.5 mg) by mouth daily, Historical Med      butalbital-acetaminophen-caffeine (FIORICET) 50-325-40 MG per tablet Take 1 tablet by  mouth every 4 (four) hours as needed for Headaches, Starting Sat 01/16/2022, E-Rx      DIPHENHYDRAMINE HCL PO Take by mouth, Historical Med      docusate sodium (COLACE) 100 MG capsule Take 1 capsule (100 mg total) by mouth daily, Starting Mon 04/21/2020, No Print      famotidine (PEPCID) 20 MG tablet Take 20 mg by mouth every 12 (twelve) hours, Historical Med      hydrocortisone 1 % ointment Apply topically 3 (three) times daily as needed (perineal care), Starting Sun 04/20/2020, No Print      lanolin ointment Apply topically every 2 (two) hours as needed for Dry Skin (nipple discomfort), Starting Sun 04/20/2020, No Print      metoclopramide (REGLAN) 10 MG tablet Take 1 tablet (10 mg) by mouth 3 (three) times daily as needed (nausea), Starting Wed 09/29/2022, E-Rx      norgestimate-ethinyl estradiol (ORTHO-CYCLEN) 0.25-35 MG-MCG per tablet Take 1 tablet by mouth daily, Historical Med      !! ondansetron (ZOFRAN-ODT) 4 MG disintegrating tablet Take 1 tablet (4 mg) by mouth every 6 (six) hours as needed for Nausea, Starting Thu 03/04/2022, E-Rx      !! ondansetron (ZOFRAN-ODT) 4 MG disintegrating tablet Take 1 tablet (4 mg) by mouth every 6 (six) hours as needed for Nausea, Starting Thu 06/24/2022, E-Rx      Prenatal MV-Min-Fe Fum-FA-DHA (PRENATAL 1 PO) Take by mouth, Historical Med      promethazine (PHENERGAN) 25 MG tablet Take 1 tablet (25 mg) by mouth every 6 (six) hours as needed for Nausea, Starting Fri 03/12/2022, E-Rx      senna-docusate (PERICOLACE) 8.6-50 MG per tablet Take 1 tablet by mouth nightly as needed for Constipation, Starting Sun 04/20/2020, No Print      traZODone (DESYREL) 50 MG tablet Take 1 tablet (50  mg) by mouth nightly, Historical Med       !! - Potential duplicate medications found. Please discuss with provider.             Bradly Chris, MD  10/24/22 2024

## 2022-10-24 NOTE — EDIE (Signed)
[facility]?NOTIFICATION?10/24/2022 15:32?FINLEIGH, CHEONG S?MRN: [id number]    Criteria Met      5 ED Visits in 12 Months    Security and Safety  No Security Events were found.  ED Care Guidelines  There are currently no ED Care Guidelines for this patient. Please check your facility's medical records system.        Prescription Drug Data  No Prescription Drug Data was found.    E.D. Visit Count (12 mo.)  Facility Visits   [place] [facility]: [facility]   Total 7   Note: Visits indicate total known visits.     Recent Emergency Department Visit Summary  Date Facility [facility] Type Diagnoses or Chief Complaint    Oct 24, 2022  [facility] Emergency Room: [facility].  [place]  Emergency      abdominal pain; emesis      Sep 29, 2022  [place] Emergency Room: [facility].  [place]  Emergency      Polycystic ovarian syndrome      Unspecified ovarian cyst, left side      Left lower quadrant pain      Abdominal Pain      Jun 24, 2022  [place] Emergency Room: [facility].  [place]  Emergency      Right lower quadrant pain      Nausea with vomiting, unspecified      Emesis      Abdominal Pain      Mar 12, 2022  [place] Emergency Room: [facility].  [place]  Emergency      Dehydration      Headache, unspecified      Pelvic and perineal pain      Nausea with vomiting, unspecified      Nausea      Headache      vomiting/weakness      Mar 04, 2022  [place] Emergency Room: [facility].  [place]  Emergency      Dysmenorrhea, unspecified      Pelvic Pain      abdominal pain; emesis; headache      Jan 16, 2022  [place] Emergency Room: [facility].  [place]  Emergency      Chest pain, unspecified      Headache, unspecified      Headache      high blood pressure/headache      Jan 10, 2022   Emergency Room: [facility].  [place]  Emergency      Elevated blood-pressure reading, without diagnosis of hypertension      Acute pharyngitis, unspecified      Acute  laryngitis      Mild intermittent asthma with (acute) exacerbation      Sore Throat      Sorethroat,cough        Recent Inpatient Visit Summary  No Recent Inpatient Visits were found.  Care Team  No Care Team was found.  [facility]  This patient has registered at the [facility] Emergency Room: [facility] Emergency Department  For more information visit: [email or web address]     PLEASE NOTE:     1.   Any care recommendations and other clinical information are provided as guidelines or for historical purposes only, and providers should exercise their own clinical judgment when providing care.    2.   You may only use this information for purposes of treatment,  payment or health care operations activities, and subject to the limitations of applicable [facility] Policies.    3.   You should consult directly with the organization that provided a care guideline or other clinical history with any questions about additional information or accuracy or completeness of information provided.    ? [id number] [facility] - [email or web address]

## 2022-10-28 NOTE — ED Provider Notes (Signed)
EMERGENCY DEPARTMENT NOTE     Patient initially seen and examined at   ED PHYSICIAN ASSIGNED       Date/Time Event User Comments    10/24/22 1917 Physician Assigned [place], MICHAEL JEFFREY Harrel Carina, MD assigned as Attending           ED [facility] (APP) ASSIGNED       Date/Time Event User Comments    10/24/22 1625 PA/NP Provider Assigned [place] Ponder. [place] Ponder, PA assigned as Physician Assistant            HISTORY OF PRESENT ILLNESS       Chief Complaint: Abdominal Pain and Vaginal Bleeding       32 y.o. female with past medical history as below Patient with hx of PCOS presents with heavy vaginal bleeding, suprapubic cramps and intermittent nausea x 3 days. Patient reports replacing menstrual devices every hour. Reports no diarrhea, constipation, fever, chills or body aches.     Independent Historian (other than patient): No  Additional History Provided by Independent Historian:  MEDICAL HISTORY     Past Medical History:  Past Medical History:   Diagnosis Date    Asthma     Headache     PCOS (polycystic ovarian syndrome)        Past Surgical History:  Past Surgical History:   Procedure Laterality Date    COLPOSCOPY  2018    TONSILLECTOMY  2019       Social History:  Social History     Socioeconomic History    Marital status: Married   Tobacco Use    Smoking status: Former     Types: Cigars     Quit date: 2019     Years since quitting: 5.3    Smokeless tobacco: Never   Vaping Use    Vaping status: Never Used   Substance and Sexual Activity    Alcohol use: Not Currently     Comment: socially    Drug use: Never    Sexual activity: Yes     Partners: Male     Birth control/protection: None     Social Determinants of Health     Financial Resource Strain: Low Risk  (09/29/2022)    Overall Financial Resource Strain (CARDIA)     Difficulty of Paying Living Expenses: Not very hard   Food Insecurity: No Food Insecurity (10/24/2022)    Hunger Vital Sign     Worried About Running Out of Food in the Last  Year: Never true     Ran Out of Food in the Last Year: Never true   Transportation Needs: No Transportation Needs (10/24/2022)    PRAPARE - Therapist, art (Medical): No     Lack of Transportation (Non-Medical): No   Physical Activity: Unknown (09/29/2022)    Exercise Vital Sign     Days of Exercise per Week: 5 days   Stress: No Stress Concern Present (09/29/2022)    [employer] of Occupational Health - Occupational Stress Questionnaire     Feeling of Stress : Not at all   Social Connections: Moderately Integrated (09/29/2022)    Social Connection and Isolation Panel [NHANES]     Frequency of Communication with Friends and Family: More than three times a week     Frequency of Social Gatherings with Friends and Family: Never     Attends Religious Services: More than 4 times per year     Active Member of [employer]  or Organizations: No     Attends Banker Meetings: Never     Marital Status: Married   Catering manager Violence: Not At Risk (10/24/2022)    Humiliation, Afraid, Rape, and Kick questionnaire     Fear of Current or Ex-Partner: No     Emotionally Abused: No     Physically Abused: No     Sexually Abused: No   Housing Stability: Low Risk  (10/24/2022)    Housing Stability Vital Sign     Unable to Pay for Housing in the Last Year: No     Number of Places Lived in the Last Year: 1     Unstable Housing in the Last Year: No       Family History:  History reviewed. No pertinent family history.    Outpatient Medication:  Discharge Medication List as of 10/24/2022  7:15 PM        CONTINUE these medications which have NOT CHANGED    Details   albuterol sulfate HFA (PROVENTIL) 108 (90 Base) MCG/ACT inhaler Inhale 2 puffs into the lungs every 6 (six) hours as needed for Wheezing, Historical Med      amLODIPine (NORVASC) 2.5 MG tablet Take 1 tablet (2.5 mg) by mouth daily, Historical Med      butalbital-acetaminophen-caffeine (FIORICET) 50-325-40 MG per tablet Take 1 tablet by mouth every 4  (four) hours as needed for Headaches, Starting Sat 01/16/2022, E-Rx      DIPHENHYDRAMINE HCL PO Take by mouth, Historical Med      docusate sodium (COLACE) 100 MG capsule Take 1 capsule (100 mg total) by mouth daily, Starting Mon 04/21/2020, No Print      famotidine (PEPCID) 20 MG tablet Take 20 mg by mouth every 12 (twelve) hours, Historical Med      hydrocortisone 1 % ointment Apply topically 3 (three) times daily as needed (perineal care), Starting Sun 04/20/2020, No Print      lanolin ointment Apply topically every 2 (two) hours as needed for Dry Skin (nipple discomfort), Starting Sun 04/20/2020, No Print      metoclopramide (REGLAN) 10 MG tablet Take 1 tablet (10 mg) by mouth 3 (three) times daily as needed (nausea), Starting Wed 09/29/2022, E-Rx      norgestimate-ethinyl estradiol (ORTHO-CYCLEN) 0.25-35 MG-MCG per tablet Take 1 tablet by mouth daily, Historical Med      !! ondansetron (ZOFRAN-ODT) 4 MG disintegrating tablet Take 1 tablet (4 mg) by mouth every 6 (six) hours as needed for Nausea, Starting Thu 03/04/2022, E-Rx      !! ondansetron (ZOFRAN-ODT) 4 MG disintegrating tablet Take 1 tablet (4 mg) by mouth every 6 (six) hours as needed for Nausea, Starting Thu 06/24/2022, E-Rx      Prenatal MV-Min-Fe Fum-FA-DHA (PRENATAL 1 PO) Take by mouth, Historical Med      promethazine (PHENERGAN) 25 MG tablet Take 1 tablet (25 mg) by mouth every 6 (six) hours as needed for Nausea, Starting Fri 03/12/2022, E-Rx      senna-docusate (PERICOLACE) 8.6-50 MG per tablet Take 1 tablet by mouth nightly as needed for Constipation, Starting Sun 04/20/2020, No Print      traZODone (DESYREL) 50 MG tablet Take 1 tablet (50 mg) by mouth nightly, Historical Med       !! - Potential duplicate medications found. Please discuss with provider.            REVIEW OF SYSTEMS   Review of Systems See History of Present Illness  PHYSICAL EXAM  ED Triage Vitals [10/24/22 1535]   Enc Vitals Group      BP (!) 166/115      Heart Rate 79      Resp  Rate 19      Temp 98.3 F (36.8 C)      Temp Source Oral      SpO2 99 %      Weight 117.5 kg      Height       Head Circumference       Peak Flow       Pain Score 10      Pain Loc       Pain Edu?       Excl. in GC?      Physical Exam   Constitutional: well appearing, well hydrated, non toxic, comfortable  HEENT: NC/AT, moist mucus membranes, oropharynx clear, nose normal, Pupils equal and reactive, EOMI  Neck: Supple, non tender, no bony or midline tenderness, full ROM, no meningeal signs  Pulmonary: CTAB, no wheezes rales or rhonchi. Normal work of breathing. No respiratory distress.  Cardiovascular: Regular Rate and Rhythm. No murmurs, rubs, or gallops.   Abdomen: Soft, non tender, non distended. No rebound, guarding, or rigidity  Skin: normal, no rash, lesions, wounds, edema or color change  MEDICAL DECISION MAKING     PRIMARY PROBLEM LIST      Acute illness/injury DIAGNOSIS: Cystitis and Dysmenorrhea  Chronic Illness Impacting Care of the above problem: Obesity Increases the risk of severe disease  Differential Diagnosis: Abdominal Pain: bowel obstruction, abscess, colitis, diverticulitis, appendicitis, peritonitis, bowel perforation, mesenteric ischemia, abdominal aortic aneurysm, cholecystitis, biliay colic, cholangitis     DISCUSSION      Patient with suprapubic craps and vaginal bleeding. Currently on her period.  No systemic symptoms  Vitals unremarkable  No rigidity, guarding, rebound tenderness, no gentle or deep palpation tenderness, no pain at McBurney's point, negative for Murphy's and Rovsing's sign. No masses, hernias, ecchymoses or bruits noted on exam.  Administered analgesia and antiemetic which improved patient's symptoms  Discussed case with ED attending, Dr. Crissie Figures, who is agreeable to the plan of care, treatment and disposition.    CBC unremarkable  CMP unremarkable  HCG negative  Unremarkable TVUS with no signs of torsion or other pathology  UA positive for leukocyte esterase and large  number of WBCs  Discharged home with treatment for pain and antibiotics for UTI and referral for GYN  Discussed signs and symptoms that should prompt patient to return to ED and follow up with PCP as well as discharge instructions and how to access patient records.  Patient had no other complaints, questions or concerns.         If patient is being hospitalized is severe sepsis or septic shock suspected?: N/A          External Records Reviewed?: Inpatient Records    Additional Notes                     Vital Signs: Reviewed the patient's vital signs.   Nursing Notes: Reviewed and utilized available nursing notes.  Medical Records Reviewed: Reviewed available past medical records.  Counseling: The emergency provider has spoken with the patient and discussed today's findings, in addition to providing specific details for the plan of care.  Questions are answered and there is agreement with the plan.      MIPS DOCUMENTATION              CARDIAC  STUDIES    The following cardiac studies were independently interpreted by me the Emergency Medicine Provider.  For full cardiac study results please see chart.                                                EMERGENCY IMAGING STUDIES    The following imagine studies were independently interpreted by me (emergency medicine provider):                       RADIOLOGY IMAGING STUDIES      US Pelvic with Transvaginal (r/o torsion)   Final Result         1. Unremarkable transabdominal/transvaginal pelvic sonography.      Launa Flight, MD   10/24/2022 5:47 PM          EMERGENCY DEPT. MEDICATIONS      ED Medication Orders (From admission, onward)      Start Ordered     Status Ordering Provider    10/24/22 1652 10/24/22 1651  ketorolac (TORADOL) injection 15 mg  Once        Route: Intravenous  Ordered Dose: 15 mg       Last MAR action: Given Rashaun Curl C    10/24/22 1652 10/24/22 1651  morphine injection 4 mg  Once        Route: Intravenous  Ordered Dose: 4 mg       Last MAR  action: Given Jahziel Sinn C            LABORATORY RESULTS    Ordered and independently interpreted AVAILABLE laboratory tests.   Results       Procedure Component Value Units Date/Time    Urinalysis Reflex to Microscopic Exam- Reflex to Culture [[id number]]  (Abnormal) Collected: 10/24/22 1654     Updated: 10/24/22 1737     Urine Type Urine, Clean Ca     Color, UA Yellow     Clarity, UA Hazy     Specific Gravity UA 1.024     Urine pH 6.0     Leukocyte Esterase, UA Trace     Nitrite, UA Negative     Protein, UR 30=1+     Glucose, UA Negative     Ketones UA Negative     Urobilinogen, UA Normal mg/dL      Bilirubin, UA Negative     Blood, UA Large     RBC, UA TNTC /hpf      WBC, UA 11-25 /hpf      Squamous Epithelial Cells, Urine 0-5 /hpf      Urine Mucus Present    Urine HCG Qualitative [[id number]] Collected: 10/24/22 1654    Specimen: Urine Updated: 10/24/22 1736     Urine HCG Qualitative Negative    Comprehensive metabolic panel [[id number]] Collected: 10/24/22 1427    Specimen: Blood Updated: 10/24/22 1702     Glucose 86 mg/dL      BUN 8.0 mg/dL      Creatinine 0.8 mg/dL      Sodium 086 mEq/L      Potassium 3.9 mEq/L      Chloride 108 mEq/L      CO2 25 mEq/L      Calcium 8.8 mg/dL      Protein, Total 7.0 g/dL      Albumin 3.6 g/dL  AST (SGOT) 14 U/L      ALT 11 U/L      Alkaline Phosphatase 77 U/L      Bilirubin, Total 0.3 mg/dL      Globulin 3.4 g/dL      Albumin/Globulin Ratio 1.1     Anion Gap 7.0     eGFR >60.0 mL/min/1.73 m2     Lipase [[id number]] Collected: 10/24/22 1427    Specimen: Blood Updated: 10/24/22 1702     Lipase 24 U/L     CBC and differential [[id number]]  (Abnormal) Collected: 10/24/22 1630    Specimen: Blood Updated: 10/24/22 1642     WBC 6.07 x10 3/uL      Hgb 12.3 g/dL      Hematocrit 78.2 %      Platelets 286 x10 3/uL      RBC 3.89 x10 6/uL      MCV 93.3 fL      MCH 31.6 pg      MCHC 33.9 g/dL      RDW 12 %      MPV 8.4 fL      Instrument Absolute Neutrophil Count 2.81 x10 3/uL       Neutrophils 46.2 %      Lymphocytes Automated 44.5 %      Monocytes 7.1 %      Eosinophils Automated 1.3 %      Basophils Automated 0.7 %      Immature Granulocytes 0.2 %      Nucleated RBC 0.0 /100 WBC      Neutrophils Absolute 2.81 x10 3/uL      Lymphocytes Absolute Automated 2.70 x10 3/uL      Monocytes Absolute Automated 0.43 x10 3/uL      Eosinophils Absolute Automated 0.08 x10 3/uL      Basophils Absolute Automated 0.04 x10 3/uL      Immature Granulocytes Absolute 0.01 x10 3/uL      Absolute NRBC 0.00 x10 3/uL               CRITICAL CARE/PROCEDURES    Procedures  DIAGNOSIS      Diagnosis:  Final diagnoses:   Cystitis   Dysmenorrhea       Disposition:  ED Disposition       ED Disposition   Discharge    Condition   --    Date/Time   Sun Oct 24, 2022  7:15 PM    Comment   Dillard Cannon [facility] discharge to home/self care.    Condition at disposition: Stable                 Prescriptions:  Discharge Medication List as of 10/24/2022  7:15 PM        START taking these medications    Details   acetaminophen (TYLENOL) 325 MG tablet Take 2 tablets (650 mg) by mouth every 6 (six) hours as needed for Pain, Starting Sun 10/24/2022, E-Rx      cephALEXin (KEFLEX) 500 MG capsule Take 1 capsule (500 mg) by mouth 2 (two) times daily for 7 days, Starting Sun 10/24/2022, Until Sun 10/31/2022, E-Rx           CONTINUE these medications which have CHANGED    Details   ibuprofen (ADVIL) 600 MG tablet Take 1 tablet (600 mg) by mouth every 6 (six) hours as needed for Pain or Fever, Starting Sun 10/24/2022, E-Rx           CONTINUE these medications which have NOT CHANGED  Details   albuterol sulfate HFA (PROVENTIL) 108 (90 Base) MCG/ACT inhaler Inhale 2 puffs into the lungs every 6 (six) hours as needed for Wheezing, Historical Med      amLODIPine (NORVASC) 2.5 MG tablet Take 1 tablet (2.5 mg) by mouth daily, Historical Med      butalbital-acetaminophen-caffeine (FIORICET) 50-325-40 MG per tablet Take 1 tablet by mouth every 4 (four) hours  as needed for Headaches, Starting Sat 01/16/2022, E-Rx      DIPHENHYDRAMINE HCL PO Take by mouth, Historical Med      docusate sodium (COLACE) 100 MG capsule Take 1 capsule (100 mg total) by mouth daily, Starting Mon 04/21/2020, No Print      famotidine (PEPCID) 20 MG tablet Take 20 mg by mouth every 12 (twelve) hours, Historical Med      hydrocortisone 1 % ointment Apply topically 3 (three) times daily as needed (perineal care), Starting Sun 04/20/2020, No Print      lanolin ointment Apply topically every 2 (two) hours as needed for Dry Skin (nipple discomfort), Starting Sun 04/20/2020, No Print      metoclopramide (REGLAN) 10 MG tablet Take 1 tablet (10 mg) by mouth 3 (three) times daily as needed (nausea), Starting Wed 09/29/2022, E-Rx      norgestimate-ethinyl estradiol (ORTHO-CYCLEN) 0.25-35 MG-MCG per tablet Take 1 tablet by mouth daily, Historical Med      !! ondansetron (ZOFRAN-ODT) 4 MG disintegrating tablet Take 1 tablet (4 mg) by mouth every 6 (six) hours as needed for Nausea, Starting Thu 03/04/2022, E-Rx      !! ondansetron (ZOFRAN-ODT) 4 MG disintegrating tablet Take 1 tablet (4 mg) by mouth every 6 (six) hours as needed for Nausea, Starting Thu 06/24/2022, E-Rx      Prenatal MV-Min-Fe Fum-FA-DHA (PRENATAL 1 PO) Take by mouth, Historical Med      promethazine (PHENERGAN) 25 MG tablet Take 1 tablet (25 mg) by mouth every 6 (six) hours as needed for Nausea, Starting Fri 03/12/2022, E-Rx      senna-docusate (PERICOLACE) 8.6-50 MG per tablet Take 1 tablet by mouth nightly as needed for Constipation, Starting Sun 04/20/2020, No Print      traZODone (DESYREL) 50 MG tablet Take 1 tablet (50 mg) by mouth nightly, Historical Med       !! - Potential duplicate medications found. Please discuss with provider.              This note was generated by the [employer] EMR system/ Dragon speech recognition and may contain inherent errors or omissions not intended by the user. Grammatical errors, random word insertions, deletions  and pronoun errors  are occasional consequences of this technology due to software limitations. Not all errors are caught or corrected. If there are questions or concerns about the content of this note or information contained within the body of this dictation they should be addressed directly with the author for clarification.           Argentina Ponder, [place]  10/29/22 0002

## 2022-12-06 ENCOUNTER — Emergency Department
Admission: EM | Admit: 2022-12-06 | Discharge: 2022-12-06 | Disposition: A | Discharge status: Home / Self Care | Payer: Commercial Managed Care - POS | Attending: Student in an Organized Health Care Education/Training Program | Admitting: Student in an Organized Health Care Education/Training Program

## 2022-12-06 DIAGNOSIS — R110 Nausea: Secondary | ICD-10-CM | POA: Insufficient documentation

## 2022-12-06 DIAGNOSIS — R102 Pelvic and perineal pain: Secondary | ICD-10-CM | POA: Insufficient documentation

## 2022-12-06 LAB — LAB USE ONLY - URINALYSIS WITH REFLEX TO MICROSCOPIC EXAM AND CULTURE
Urine Bilirubin: NEGATIVE
Urine Blood: NEGATIVE
Urine Glucose: NEGATIVE
Urine Ketones: NEGATIVE mg/dL
Urine Nitrite: NEGATIVE
Urine Specific Gravity: 1.03 (ref 1.001–1.035)
Urine Urobilinogen: 3 mg/dL — AB (ref 0.2–2.0)
Urine pH: 6 (ref 5.0–8.0)

## 2022-12-06 LAB — LAB USE ONLY - CBC WITH DIFFERENTIAL
Absolute Basophils: 0.03 10*3/uL (ref 0.00–0.08)
Absolute Eosinophils: 0.06 10*3/uL (ref 0.00–0.44)
Absolute Immature Granulocytes: 0.03 10*3/uL (ref 0.00–0.07)
Absolute Lymphocytes: 3.05 10*3/uL (ref 0.42–3.22)
Absolute Monocytes: 0.77 10*3/uL (ref 0.21–0.85)
Absolute Neutrophils: 4.11 10*3/uL (ref 1.10–6.33)
Absolute nRBC: 0 10*3/uL (ref <=0.00)
Basophils %: 0.4 %
Eosinophils %: 0.7 %
Hematocrit: 37.9 % (ref 34.7–43.7)
Hemoglobin: 13.2 g/dL (ref 11.4–14.8)
Immature Granulocytes %: 0.4 %
Lymphocytes %: 37.9 %
MCH: 32.3 pg (ref 25.1–33.5)
MCHC: 34.8 g/dL (ref 31.5–35.8)
MCV: 92.7 fL (ref 78.0–96.0)
MPV: 8.6 fL — ABNORMAL LOW (ref 8.9–12.5)
Monocytes %: 9.6 %
Neutrophils %: 51 %
Platelet Count: 322 10*3/uL (ref 142–346)
Preliminary Absolute Neutrophil Count: 4.11 10*3/uL (ref 1.10–6.33)
RBC: 4.09 10*6/uL (ref 3.90–5.10)
RDW: 12 % (ref 11–15)
WBC: 8.05 10*3/uL (ref 3.10–9.50)
nRBC %: 0 /100 WBC (ref <=0.0)

## 2022-12-06 LAB — COMPREHENSIVE METABOLIC PANEL
ALT: 16 U/L (ref 0–55)
AST (SGOT): 18 U/L (ref 5–41)
Albumin/Globulin Ratio: 1.2 (ref 0.9–2.2)
Albumin: 4.4 g/dL (ref 3.5–5.0)
Alkaline Phosphatase: 90 U/L (ref 37–117)
Anion Gap: 13 (ref 5.0–15.0)
BUN: 13 mg/dL (ref 7–21)
Bilirubin, Total: 0.8 mg/dL (ref 0.2–1.2)
CO2: 25 mEq/L (ref 17–29)
Calcium: 9.5 mg/dL (ref 8.5–10.5)
Chloride: 100 mEq/L (ref 99–111)
Creatinine: 1 mg/dL (ref 0.4–1.0)
GFR: >60 mL/min/{1.73_m2} (ref >=60.0)
Globulin: 3.8 g/dL — ABNORMAL HIGH (ref 2.0–3.6)
Glucose: 83 mg/dL (ref 70–100)
Potassium: 3.4 mEq/L — ABNORMAL LOW (ref 3.5–5.3)
Protein, Total: 8.2 g/dL (ref 6.0–8.3)
Sodium: 138 mEq/L (ref 135–145)

## 2022-12-06 LAB — LAB USE ONLY - URINE GRAY CULTURE HOLD TUBE

## 2022-12-06 LAB — SERUM HCG, QUALITATIVE: hCG Qualitative: NEGATIVE

## 2022-12-06 MED ORDER — SODIUM CHLORIDE 0.9 % IV BOLUS
500.0000 mL | Freq: Once | INTRAVENOUS | Status: AC
Start: 2022-12-06 — End: 2022-12-06
  Administered 2022-12-06: 500 mL via INTRAVENOUS

## 2022-12-06 MED ORDER — ONDANSETRON HCL 4 MG/2ML IJ SOLN
4.0000 mg | Freq: Once | INTRAMUSCULAR | Status: AC
Start: 2022-12-06 — End: 2022-12-06
  Administered 2022-12-06: 4 mg via INTRAVENOUS
  Filled 2022-12-06: qty 2

## 2022-12-06 MED ORDER — KETOROLAC TROMETHAMINE 10 MG PO TABS
10.0000 mg | ORAL_TABLET | Freq: Four times a day (QID) | ORAL | 0 refills | Status: AC | PRN
Start: 2022-12-06 — End: ?

## 2022-12-06 MED ORDER — PROMETHAZINE HCL 25 MG PO TABS
25.0000 mg | ORAL_TABLET | Freq: Four times a day (QID) | ORAL | 0 refills | Status: DC | PRN
Start: 2022-12-06 — End: 2023-06-14

## 2022-12-06 MED ORDER — POTASSIUM CHLORIDE 20 MEQ PO PACK
40.0000 meq | PACK | Freq: Once | ORAL | Status: AC
Start: 2022-12-06 — End: 2022-12-06
  Administered 2022-12-06: 40 meq via ORAL
  Filled 2022-12-06: qty 2

## 2022-12-06 MED ORDER — KETOROLAC TROMETHAMINE 30 MG/ML IJ SOLN
30.0000 mg | Freq: Once | INTRAMUSCULAR | Status: AC
Start: 2022-12-06 — End: 2022-12-06
  Administered 2022-12-06: 30 mg via INTRAVENOUS
  Filled 2022-12-06: qty 1

## 2022-12-06 NOTE — ED Provider Notes (Signed)
EMERGENCY DEPARTMENT NOTE     Patient initially seen and examined at   ED PHYSICIAN ASSIGNED       Date/Time Event User Comments    12/06/22 1522 Physician Assigned Loura Pardon, Kisa Fujii, Daylen Hack, MD assigned as Attending             HISTORY OF PRESENT ILLNESS   Independent Historian: patient    Chief Complaint: Abdominal Pain and Back Pain       32 y.o. female presents with complaints of pelvic pain rating to the back.  Patient states she has a history of PCOS and was diagnosed with endometriosis.  States that she has very similar flareups in the past.  Denies any other symptoms such as fevers, dysuria, abnormal vaginal discharge.  States she has been having some nausea vomiting with the symptoms.  Presented to ED as not improved with ibuprofen. Was started on OCPs 1 month ago per patient.     MEDICAL HISTORY     Past Medical History:  Past Medical History:   Diagnosis Date    Asthma     Headache     PCOS (polycystic ovarian syndrome)     Past Surgical History:  Past Surgical History:   Procedure Laterality Date    COLPOSCOPY  2018    TONSILLECTOMY  2019      Family History:  History reviewed. No pertinent family history. Social History:  Social History     Socioeconomic History    Marital status: Married   Tobacco Use    Smoking status: Former     Types: Cigars     Quit date: 2019     Years since quitting: 5.4    Smokeless tobacco: Never   Vaping Use    Vaping status: Never Used   Substance and Sexual Activity    Alcohol use: Not Currently     Comment: socially    Drug use: Never    Sexual activity: Yes     Partners: Male     Birth control/protection: None     Social Determinants of Health     Financial Resource Strain: Low Risk  (09/29/2022)    Overall Financial Resource Strain (CARDIA)     Difficulty of Paying Living Expenses: Not very hard   Food Insecurity: No Food Insecurity (12/06/2022)    Hunger Vital Sign     Worried About Running Out of Food in the Last Year: Never true     Ran Out of Food in  the Last Year: Never true   Transportation Needs: No Transportation Needs (12/06/2022)    PRAPARE - Therapist, art (Medical): No     Lack of Transportation (Non-Medical): No   Physical Activity: Unknown (09/29/2022)    Exercise Vital Sign     Days of Exercise per Week: 5 days   Stress: No Stress Concern Present (09/29/2022)    [employer] of Occupational Health - Occupational Stress Questionnaire     Feeling of Stress : Not at all   Social Connections: Moderately Integrated (09/29/2022)    Social Connection and Isolation Panel [NHANES]     Frequency of Communication with Friends and Family: More than three times a week     Frequency of Social Gatherings with Friends and Family: Never     Attends Religious Services: More than 4 times per year     Active Member of [employer] or Organizations: No     Attends Banker Meetings: Never  Marital Status: Married   Catering manager Violence: Not At Risk (12/06/2022)    Humiliation, Afraid, Rape, and Kick questionnaire     Fear of Current or Ex-Partner: No     Emotionally Abused: No     Physically Abused: No     Sexually Abused: No   Housing Stability: Low Risk  (12/06/2022)    Housing Stability Vital Sign     Unable to Pay for Housing in the Last Year: No     Number of Places Lived in the Last Year: 1     Unstable Housing in the Last Year: No        Outpatient Medication:  Previous Medications    ACETAMINOPHEN (TYLENOL) 325 MG TABLET    Take 2 tablets (650 mg) by mouth every 6 (six) hours as needed for Pain    ALBUTEROL SULFATE HFA (PROVENTIL) 108 (90 BASE) MCG/ACT INHALER    Inhale 2 puffs into the lungs every 6 (six) hours as needed for Wheezing    AMLODIPINE (NORVASC) 2.5 MG TABLET    Take 1 tablet (2.5 mg) by mouth daily    BUTALBITAL-ACETAMINOPHEN-CAFFEINE (FIORICET) 50-325-40 MG PER TABLET    Take 1 tablet by mouth every 4 (four) hours as needed for Headaches    DIPHENHYDRAMINE HCL PO    Take by mouth    DOCUSATE SODIUM (COLACE) 100 MG  CAPSULE    Take 1 capsule (100 mg total) by mouth daily    FAMOTIDINE (PEPCID) 20 MG TABLET    Take 20 mg by mouth every 12 (twelve) hours    HYDROCORTISONE 1 % OINTMENT    Apply topically 3 (three) times daily as needed (perineal care)    LANOLIN OINTMENT    Apply topically every 2 (two) hours as needed for Dry Skin (nipple discomfort)    NORGESTIMATE-ETHINYL ESTRADIOL (ORTHO-CYCLEN) 0.25-35 MG-MCG PER TABLET    Take 1 tablet by mouth daily    ONDANSETRON (ZOFRAN-ODT) 4 MG DISINTEGRATING TABLET    Take 1 tablet (4 mg) by mouth every 6 (six) hours as needed for Nausea    ONDANSETRON (ZOFRAN-ODT) 4 MG DISINTEGRATING TABLET    Take 1 tablet (4 mg) by mouth every 6 (six) hours as needed for Nausea    PRENATAL MV-MIN-FE FUM-FA-DHA (PRENATAL 1 PO)    Take by mouth    SENNA-DOCUSATE (PERICOLACE) 8.6-50 MG PER TABLET    Take 1 tablet by mouth nightly as needed for Constipation    TRAZODONE (DESYREL) 50 MG TABLET    Take 1 tablet (50 mg) by mouth nightly       PHYSICAL EXAM       Constitutional: Appears stated age  HEENT:  Atraumatic, non-icteric, no rhinorrhea   CV:  Well perfused, normal rate   Resp: Normal effort  GI: Soft, no distention, suprapubic and LLQ TTP  MSK: No acute injuries on extremities  Neuro: Alert, moving extremities spontaneous   Psych: Intact judgement and insight  Skin: Warm, dry ED Triage Vitals [12/06/22 1513]   Enc Vitals Group      BP 144/87      Heart Rate 95      Resp Rate 17      Temp 97.8 F (36.6 C)      Temp Source Oral      SpO2 99 %      Weight 108.9 kg      Height 1.626 m      Head Circumference       Peak  Flow       Pain Score 10      Pain Loc       Pain Edu?       Excl. in GC?           MEDICAL DECISION MAKING     PRIMARY PROBLEM LIST     Chronic Illness with Exacerbation/Progression Moderate pelvic pain in the setting of suspected endometriosis and PCOS  Chronic Illness Impacting Care of the above problem: Obesity Increases complexity of evaluation and Increases the risk of severe  disease  Differential Diagnosis: UTI, renal stone, endometriosis, diverticulitis, colitis, less likely torsion  DISCUSSION        32 y.o. female presents with suprapubic and LLQ abd pain. Notes very similar to previous episodes. Suspect endometriosis flare up, less likely torsion. Previous US and CT imaging negative for similar presentation. Will obtain labs for eval and give Toradol, Zofran and fluids for initial management.     Patient's labs are unremarkable.  Her symptoms have improved after Toradol.  Will prescribe Toradol for her pain.  Will prescribe Phenergan as she states this has helped better than Zofran for her nausea in the past.  I did discuss imaging with the patient but patient declined at this time given negative results in the past in the setting of similar symptoms.  States she will call her gynecologist tomorrow to return in stable condition.         PULSE OX  CARDIAC STUDIES  EMERGENCY IMAGING STUDIES   The following studies and imaging were independently interpreted by me Lessie Funderburke, MD.    For full study results please see chart.        CRITICAL CARE/PROCEDURES    Procedures      RADIOLOGY IMAGING STUDIES      No orders to display             External Records Reviewed?: Physician Office Records reviewed OB notes from 06/29/22 with she was seen for follow up low abd and back pain and evaluated for amenorrhea  Additional Notes                    Vital Signs: Reviewed the patient's vital signs.   Nursing Notes: Reviewed and utilized available nursing notes.  Medical Records Reviewed: Reviewed available past medical records.  Counseling: The emergency provider has spoken with the patient and discussed today's findings, in addition to providing specific details for the plan of care.  Questions are answered and there is agreement with the plan.      MIPS DOCUMENTATION        EMERGENCY DEPT. MEDICATIONS      ED Medication Orders (From admission, onward)      Start Ordered     Status Ordering  Provider    12/06/22 1616 12/06/22 1615  potassium chloride (KLOR-CON) packet 40 mEq  Once        Route: Oral  Ordered Dose: 40 mEq       Acknowledged Darwyn Ponzo    12/06/22 1539 12/06/22 1538  sodium chloride 0.9 % bolus 500 mL  Once        Route: Intravenous  Ordered Dose: 500 mL       Last MAR action: New Bag Kwanza Cancelliere    12/06/22 1539 12/06/22 1538  ondansetron (ZOFRAN) injection 4 mg  Once        Route: Intravenous  Ordered Dose: 4 mg       Last MAR action:  Given Jamarie Joplin    12/06/22 1539 12/06/22 1538  ketorolac (TORADOL) injection 30 mg  Once        Route: Intravenous  Ordered Dose: 30 mg       Last MAR action: Given Waverly Chavarria            LABORATORY RESULTS    Ordered and independently interpreted AVAILABLE laboratory tests.   Results       Procedure Component Value Units Date/Time    Culture, Urine [[id number]] Collected: 12/06/22 1527    Specimen: Urine, Clean Catch Updated: 12/06/22 1618    Comprehensive Metabolic Panel [[id number]]  (Abnormal) Collected: 12/06/22 1528    Specimen: Blood, Venous Updated: 12/06/22 1604     Glucose 83 mg/dL      BUN 13 mg/dL      Creatinine 1.0 mg/dL      Sodium 188 mEq/L      Potassium 3.4 mEq/L      Chloride 100 mEq/L      CO2 25 mEq/L      Calcium 9.5 mg/dL      Anion Gap 41.6     GFR >60.0 mL/min/1.73 m2      AST (SGOT) 18 U/L      ALT 16 U/L      Alkaline Phosphatase 90 U/L      Albumin 4.4 g/dL      Protein, Total 8.2 g/dL      Globulin 3.8 g/dL      Albumin/Globulin Ratio 1.2     Bilirubin, Total 0.8 mg/dL     Serum Beta HCG Qualitative [[id number]]  (Normal) Collected: 12/06/22 1528    Specimen: Blood, Venous Updated: 12/06/22 1559     hCG Qualitative Negative    Urinalysis with Reflex to Microscopic Exam and Culture [[id number]]  (Abnormal) Collected: 12/06/22 1527    Specimen: Urine, Clean Catch Updated: 12/06/22 1547    Narrative:      The following orders were created for panel order Urinalysis with Reflex to Microscopic  Exam and Culture.  Procedure                               Abnormality         Status                     ---------                               -----------         ------                     Urinalysis with Reflex t.Marland KitchenMarland Kitchen[[id number]]  Abnormal            Final result               Urine [employer] .Marland KitchenMarland Kitchen[[id number]]                      In process                   Please view results for these tests on the individual orders.    Urinalysis with Reflex to Microscopic Exam and Culture [[id number]]  (Abnormal) Collected: 12/06/22 1527    Specimen: Urine, Clean Catch Updated: 12/06/22 1547     Urine Color Yellow  Urine Clarity Clear     Urine Specific Gravity 1.030     Urine pH 6.0     Urine Leukocyte Esterase Moderate     Urine Nitrite Negative     Urine Protein 20= Trace     Urine Glucose Negative     Urine Ketones Negative mg/dL      Urine Urobilinogen 3.0 mg/dL      Urine Bilirubin Negative     Urine Blood Negative     RBC, UA 3-5 /hpf      Urine WBC 6-10 /hpf      Urine Squamous Epithelial Cells 0-5 /hpf      Urine Mucus Present    CBC with Differential [[id number]]  (Abnormal) Collected: 12/06/22 1528    Specimen: Blood, Venous Updated: 12/06/22 1545    Narrative:      The following orders were created for panel order CBC with Differential.  Procedure                               Abnormality         Status                     ---------                               -----------         ------                     CBC with Differential[[id number]]        Abnormal            Final result                 Please view results for these tests on the individual orders.    CBC with Differential [[id number]]  (Abnormal) Collected: 12/06/22 1528    Specimen: Blood, Venous Updated: 12/06/22 1545     WBC 8.05 x10 3/uL      Hemoglobin 13.2 g/dL      Hematocrit 40.3 %      Platelet Count 322 x10 3/uL      MPV 8.6 fL      RBC 4.09 x10 6/uL      MCV 92.7 fL      MCH 32.3 pg      MCHC 34.8 g/dL      RDW 12 %      nRBC % 0.0 /100  WBC      Absolute nRBC 0.00 x10 3/uL      Preliminary Absolute Neutrophil Count 4.11 x10 3/uL      Neutrophils % 51.0 %      Lymphocytes % 37.9 %      Monocytes % 9.6 %      Eosinophils % 0.7 %      Basophils % 0.4 %      Immature Granulocytes % 0.4 %      Absolute Neutrophils 4.11 x10 3/uL      Absolute Lymphocytes 3.05 x10 3/uL      Absolute Monocytes 0.77 x10 3/uL      Absolute Eosinophils 0.06 x10 3/uL      Absolute Basophils 0.03 x10 3/uL      Absolute Immature Granulocytes 0.03 x10 3/uL     Urine [employer] Tube [[id number]] Collected: 12/06/22 1527    Specimen:  Urine, Clean Catch Updated: 12/06/22 1542            DIAGNOSIS      Diagnosis:  Final diagnoses:   Pelvic pain in female       Disposition:  ED Disposition       ED Disposition   Discharge    Condition   --    Date/Time   Mon Dec 06, 2022  4:23 PM    Comment   Dillard Cannon Bumbaugh discharge to home/self care.    Condition at disposition: Stable                 Prescriptions:  Patient's Medications   New Prescriptions    KETOROLAC (TORADOL) 10 MG TABLET    Take 1 tablet (10 mg) by mouth every 6 (six) hours as needed for Pain   Previous Medications    ACETAMINOPHEN (TYLENOL) 325 MG TABLET    Take 2 tablets (650 mg) by mouth every 6 (six) hours as needed for Pain    ALBUTEROL SULFATE HFA (PROVENTIL) 108 (90 BASE) MCG/ACT INHALER    Inhale 2 puffs into the lungs every 6 (six) hours as needed for Wheezing    AMLODIPINE (NORVASC) 2.5 MG TABLET    Take 1 tablet (2.5 mg) by mouth daily    BUTALBITAL-ACETAMINOPHEN-CAFFEINE (FIORICET) 50-325-40 MG PER TABLET    Take 1 tablet by mouth every 4 (four) hours as needed for Headaches    DIPHENHYDRAMINE HCL PO    Take by mouth    DOCUSATE SODIUM (COLACE) 100 MG CAPSULE    Take 1 capsule (100 mg total) by mouth daily    FAMOTIDINE (PEPCID) 20 MG TABLET    Take 20 mg by mouth every 12 (twelve) hours    HYDROCORTISONE 1 % OINTMENT    Apply topically 3 (three) times daily as needed (perineal care)    LANOLIN OINTMENT     Apply topically every 2 (two) hours as needed for Dry Skin (nipple discomfort)    NORGESTIMATE-ETHINYL ESTRADIOL (ORTHO-CYCLEN) 0.25-35 MG-MCG PER TABLET    Take 1 tablet by mouth daily    ONDANSETRON (ZOFRAN-ODT) 4 MG DISINTEGRATING TABLET    Take 1 tablet (4 mg) by mouth every 6 (six) hours as needed for Nausea    ONDANSETRON (ZOFRAN-ODT) 4 MG DISINTEGRATING TABLET    Take 1 tablet (4 mg) by mouth every 6 (six) hours as needed for Nausea    PRENATAL MV-MIN-FE FUM-FA-DHA (PRENATAL 1 PO)    Take by mouth    SENNA-DOCUSATE (PERICOLACE) 8.6-50 MG PER TABLET    Take 1 tablet by mouth nightly as needed for Constipation    TRAZODONE (DESYREL) 50 MG TABLET    Take 1 tablet (50 mg) by mouth nightly   Modified Medications    Modified Medication Previous Medication    PROMETHAZINE (PHENERGAN) 25 MG TABLET promethazine (PHENERGAN) 25 MG tablet       Take 1 tablet (25 mg) by mouth every 6 (six) hours as needed for Nausea    Take 1 tablet (25 mg) by mouth every 6 (six) hours as needed for Nausea   Discontinued Medications    IBUPROFEN (ADVIL) 600 MG TABLET    Take 1 tablet (600 mg) by mouth every 6 (six) hours as needed for Pain or Fever    METOCLOPRAMIDE (REGLAN) 10 MG TABLET    Take 1 tablet (10 mg) by mouth 3 (three) times daily as needed (nausea)  This note was generated by the Epic EMR system/ Dragon speech recognition and may contain inherent errors or omissions not intended by the user. Grammatical errors, random word insertions, deletions and pronoun errors  are occasional consequences of this technology due to software limitations. Not all errors are caught or corrected. If there are questions or concerns about the content of this note or information contained within the body of this dictation they should be addressed directly with the author for clarification.       Treylen Gibbs, MD  12/06/22 1625

## 2022-12-06 NOTE — ED Notes (Signed)
PIV removed. Reviewed discharge instructions, follow-up care, and 2 e-scripts with pt. All questions addressed. Pt ambulatory to lobby with steady gait at time of discharge.

## 2022-12-06 NOTE — EDIE (Signed)
[facility]?NOTIFICATION?12/06/2022 15:02?Angela [name], Angela [name]?MRN: [id number]    Criteria Met      5 ED Visits in 12 Months    Security and Safety  No Security Events were found.  ED Care Guidelines  There are currently no ED Care Guidelines for this patient. Please check your facility'[name] medical records system.        Prescription Drug Data  No Prescription Drug Data was found.    E.D. Visit Count (12 mo.)  Facility Visits   [place] [facility]: [facility] 8   Total 8   Note: Visits indicate total known visits.     Recent Emergency Department Visit Summary  Date Facility [facility] Type Diagnoses or Chief Complaint    Dec 06, 2022  [facility]: [facility].  [place]  Emergency      abdominal pain, back pain      Oct 24, 2022  [place] Emergency [facility]: [facility]  [place]  Emergency      Cystitis, unspecified without hematuria      Dysmenorrhea, unspecified      Abdominal Pain      Vaginal Bleeding      abdominal pain; emesis      Sep 29, 2022  [place] Emergency Room: [facility].  [place]  Emergency      Polycystic ovarian syndrome      Unspecified ovarian cyst, left side      Left lower quadrant pain      Abdominal Pain      Jun 24, 2022  [place] Emergency Room: [facility].  [place]  Emergency      Right lower quadrant pain      Nausea with vomiting, unspecified      Emesis      Abdominal Pain      Mar 12, 2022  [place] Emergency Room: [facility].  [place]  Emergency      Dehydration      Headache, unspecified      Pelvic and perineal pain      Nausea with vomiting, unspecified      Nausea      Headache      vomiting/weakness      Mar 04, 2022  [place] Emergency Room: [facility].  [place]  Emergency      Dysmenorrhea, unspecified      Pelvic Pain      abdominal pain; emesis; headache      Jan 16, 2022  [place] Emergency Room: [facility].  [place]  Emergency      Chest pain, unspecified      Headache, unspecified      Headache       high blood pressure/headache      Jan 10, 2022  [place] Emergency Room: [facility]  [place]  Emergency      Elevated blood-pressure reading, without diagnosis of hypertension      Acute pharyngitis, unspecified      Acute laryngitis      Mild intermittent asthma with (acute) exacerbation      Sore Throat      Sorethroat,cough        Recent Inpatient Visit Summary  No Recent Inpatient Visits were found.  Care Team  No Care Team was found.  [facility]  This patient has registered at the [facility] Emergency Room: [facility] Emergency Department  For  more information visit: [email or web address]     PLEASE NOTE:     1.   Any care recommendations and other clinical information are provided as guidelines or for historical purposes only, and providers should exercise their own clinical judgment when providing care.    2.   You may only use this information for purposes of treatment, payment or health care operations activities, and subject to the limitations of applicable [facility] Policies.    3.   You should consult directly with the organization that provided a care guideline or other clinical history with any questions about additional information or accuracy or completeness of information provided.    ? 2024 [facility] - [email or web address]

## 2022-12-08 LAB — CULTURE, URINE: Culture Urine: NORMAL

## 2023-01-04 HISTORY — PX: PELVIC LAPAROSCOPY: SHX162

## 2023-03-26 ENCOUNTER — Emergency Department
Admission: EM | Admit: 2023-03-26 | Discharge: 2023-03-26 | Disposition: A | Discharge status: Home / Self Care | Payer: No Typology Code available for payment source | Attending: Emergency Medicine | Admitting: Emergency Medicine

## 2023-03-26 DIAGNOSIS — R102 Pelvic and perineal pain: Secondary | ICD-10-CM | POA: Insufficient documentation

## 2023-03-26 LAB — LAB USE ONLY - CBC WITH DIFFERENTIAL
Absolute Basophils: 0.04 10*3/uL (ref 0.00–0.08)
Absolute Eosinophils: 0.09 10*3/uL (ref 0.00–0.44)
Absolute Immature Granulocytes: 0.02 10*3/uL (ref 0.00–0.07)
Absolute Lymphocytes: 3.29 10*3/uL — ABNORMAL HIGH (ref 0.42–3.22)
Absolute Monocytes: 0.57 10*3/uL (ref 0.21–0.85)
Absolute Neutrophils: 2.69 10*3/uL (ref 1.10–6.33)
Absolute nRBC: 0 10*3/uL (ref <=0.00)
Basophils %: 0.6 %
Eosinophils %: 1.3 %
Hematocrit: 36.2 % (ref 34.7–43.7)
Hemoglobin: 12.2 g/dL (ref 11.4–14.8)
Immature Granulocytes %: 0.3 %
Lymphocytes %: 49.1 %
MCH: 31.2 pg (ref 25.1–33.5)
MCHC: 33.7 g/dL (ref 31.5–35.8)
MCV: 92.6 fL (ref 78.0–96.0)
MPV: 8.5 fL — ABNORMAL LOW (ref 8.9–12.5)
Monocytes %: 8.5 %
Neutrophils %: 40.2 %
Platelet Count: 305 10*3/uL (ref 142–346)
Preliminary Absolute Neutrophil Count: 2.69 10*3/uL (ref 1.10–6.33)
RBC: 3.91 10*6/uL (ref 3.90–5.10)
RDW: 12 % (ref 11–15)
WBC: 6.7 10*3/uL (ref 3.10–9.50)
nRBC %: 0 /100{WBCs} (ref <=0.0)

## 2023-03-26 LAB — COMPREHENSIVE METABOLIC PANEL
ALT: 19 U/L (ref 0–55)
AST (SGOT): 22 U/L (ref 5–41)
Albumin/Globulin Ratio: 1 (ref 0.9–2.2)
Albumin: 3.8 g/dL (ref 3.5–5.0)
Alkaline Phosphatase: 93 U/L (ref 37–117)
Anion Gap: 9 (ref 5.0–15.0)
BUN: 14 mg/dL (ref 7–21)
Bilirubin, Total: 0.4 mg/dL (ref 0.2–1.2)
CO2: 24 meq/L (ref 17–29)
Calcium: 9.4 mg/dL (ref 8.5–10.5)
Chloride: 105 meq/L (ref 99–111)
Creatinine: 0.9 mg/dL (ref 0.4–1.0)
GFR: >60 mL/min/{1.73_m2} (ref >=60.0)
Globulin: 3.8 g/dL — ABNORMAL HIGH (ref 2.0–3.6)
Glucose: 85 mg/dL (ref 70–100)
Potassium: 3.6 meq/L (ref 3.5–5.3)
Protein, Total: 7.6 g/dL (ref 6.0–8.3)
Sodium: 138 meq/L (ref 135–145)

## 2023-03-26 LAB — URINALYSIS WITH REFLEX TO MICROSCOPIC EXAM - REFLEX TO CULTURE
Urine Bilirubin: NEGATIVE
Urine Blood: NEGATIVE
Urine Glucose: NEGATIVE
Urine Ketones: NEGATIVE mg/dL
Urine Leukocyte Esterase: NEGATIVE
Urine Nitrite: NEGATIVE
Urine Protein: NEGATIVE
Urine Specific Gravity: 1.019 (ref 1.001–1.035)
Urine Urobilinogen: NORMAL mg/dL (ref 0.2–2.0)
Urine pH: 6.5 (ref 5.0–8.0)

## 2023-03-26 LAB — LIPASE: Lipase: 22 U/L (ref 8–78)

## 2023-03-26 LAB — URINE HCG QUALITATIVE: Urine HCG Qualitative: NEGATIVE

## 2023-03-26 LAB — SERUM HCG, QUALITATIVE: hCG Qualitative: NEGATIVE

## 2023-03-26 MED ORDER — KETOROLAC TROMETHAMINE 30 MG/ML IJ SOLN
30.0000 mg | Freq: Once | INTRAMUSCULAR | Status: AC
Start: 2023-03-26 — End: 2023-03-26
  Administered 2023-03-26: 30 mg via INTRAVENOUS
  Filled 2023-03-26: qty 1

## 2023-03-26 MED ORDER — OXYCODONE-ACETAMINOPHEN 5-325 MG PO TABS
1.0000 | ORAL_TABLET | Freq: Once | ORAL | Status: AC
Start: 2023-03-26 — End: 2023-03-26
  Administered 2023-03-26: 1 via ORAL
  Filled 2023-03-26: qty 1

## 2023-03-26 MED ORDER — MORPHINE SULFATE 10 MG/ML IJ/IV SOLN (WRAP)
6.0000 mg | Freq: Once | Status: AC
Start: 2023-03-26 — End: 2023-03-26
  Administered 2023-03-26: 6 mg via INTRAVENOUS
  Filled 2023-03-26: qty 1

## 2023-03-26 MED ORDER — HYDROCODONE-ACETAMINOPHEN 5-325 MG PO TABS
1.0000 | ORAL_TABLET | Freq: Four times a day (QID) | ORAL | 0 refills | Status: AC | PRN
Start: 2023-03-26 — End: 2023-04-02

## 2023-03-26 NOTE — EDIE (Signed)
[facility] NOTIFICATION 03/26/2023 09:31 [name], Angela OLIVOS DOB: 1990-10-20 MRN: [id number]    Criteria Met      5 ED Visits in 12 Months    Security and Safety  No Security Events were found.  ED Care Guidelines  There are currently no ED Care Guidelines for this patient. Please check your facility's medical records system.        Prescription Drug Data  No Prescription Drug Data was found.    E.D. Visit Count (12 mo.)  Facility Visits   [name] 5   Total 5   Note: Visits indicate total known visits.     Recent Emergency Department Visit Summary  Date Facility [name] Type Diagnoses or Chief Complaint    Mar 26, 2023  [name] Emergency Room: [facility].  [name]  Emergency      Abdominal pain, Back pain      Dec 06, 2022  [name]  Emergency      Pelvic and perineal pain      Back Pain      Abdominal Pain      abdominal pain, back pain      Oct 24, 2022  [name]  [name]  Emergency      Cystitis, unspecified without hematuria      Dysmenorrhea, unspecified      Abdominal Pain      Vaginal Bleeding      abdominal pain; emesis      Sep 29, 2022  [name]  [name]  Emergency      Polycystic ovarian syndrome      Unspecified ovarian cyst, left side      Left lower quadrant pain      Abdominal Pain      Jun 24, 2022  [name]  [name]  Emergency      Right lower quadrant pain      Nausea with vomiting, unspecified      Emesis      Abdominal Pain        Recent Inpatient Visit Summary  No Recent Inpatient Visits were found.  Care Team  No Care Team was found.  [facility]  This patient has registered at the [facility] Emergency Room: [facility] Emergency Department  For more information visit: [email or web address]     PLEASE NOTE:     1.   Any care  recommendations and other clinical information are provided as guidelines or for historical purposes only, and providers should exercise their own clinical judgment when providing care.    2.   You may only use this information for purposes of treatment, payment or health care operations activities, and subject to the limitations of applicable [facility] Policies.    3.   You should consult directly with the organization that provided a care guideline or other clinical history with any questions about additional information or accuracy or completeness of information provided.    ? 2024 [facility] - [email or web address]

## 2023-03-26 NOTE — ED Triage Notes (Signed)
32 y/o female states hx of endometrosis, states worsening  aching throbbing abdominal and pain since last Thursday. No vaginal bleeding or discharge noted. Nausea, but states she is able to tolerate PO     Job Founds, RN

## 2023-03-26 NOTE — ED Provider Notes (Signed)
EMERGENCY DEPARTMENT NOTE     Patient initially seen and examined at   ED PHYSICIAN ASSIGNED       Date/Time Event User Comments    03/26/23 0951 Physician Assigned Matilde Pottenger C. Sumayyah Custodio, Assunta Gambles, DO assigned as Attending           ED MIDLEVEL (APP) ASSIGNED       None            HISTORY OF PRESENT ILLNESS       Chief Complaint: Abdominal Pain (states hx of endometrosis, states worsening  aching throbbing abdominal and pain since last Thursday. No vaginal bleeding or discharge noted. Nausea, but states she is able to tolerate PO )       32 y.o. female with past medical history as below presents with pelvic pain for a week. States she has a history of endometriosis and this feels similar. Has been taking NSAIDS and tylenol without relief. Denies vaginal bleeding, discharge, nausea, vomiting, diarrhea, constipation.     Independent Historian (other than patient): No  Additional History Provided by Independent Historian:  MEDICAL HISTORY     Past Medical History:  Past Medical History:   Diagnosis Date    Asthma     Headache     PCOS (polycystic ovarian syndrome)        Past Surgical History:  Past Surgical History[1]    Social History:  Social History[2]    Family History:  Family History[3]    Outpatient Medication:  Previous Medications    ACETAMINOPHEN (TYLENOL) 325 MG TABLET    Take 2 tablets (650 mg) by mouth every 6 (six) hours as needed for Pain    ALBUTEROL SULFATE HFA (PROVENTIL) 108 (90 BASE) MCG/ACT INHALER    Inhale 2 puffs into the lungs every 6 (six) hours as needed for Wheezing    AMLODIPINE (NORVASC) 2.5 MG TABLET    Take 1 tablet (2.5 mg) by mouth daily    BUTALBITAL-ACETAMINOPHEN-CAFFEINE (FIORICET) 50-325-40 MG PER TABLET    Take 1 tablet by mouth every 4 (four) hours as needed for Headaches    DIPHENHYDRAMINE HCL PO    Take by mouth    DOCUSATE SODIUM (COLACE) 100 MG CAPSULE    Take 1 capsule (100 mg total) by mouth daily    ELAGOLIX SODIUM 200 MG TAB    Take by mouth    FAMOTIDINE (PEPCID)  20 MG TABLET    Take 20 mg by mouth every 12 (twelve) hours    HYDROCORTISONE 1 % OINTMENT    Apply topically 3 (three) times daily as needed (perineal care)    KETOROLAC (TORADOL) 10 MG TABLET    Take 1 tablet (10 mg) by mouth every 6 (six) hours as needed for Pain    LANOLIN OINTMENT    Apply topically every 2 (two) hours as needed for Dry Skin (nipple discomfort)    NORGESTIMATE-ETHINYL ESTRADIOL (ORTHO-CYCLEN) 0.25-35 MG-MCG PER TABLET    Take 1 tablet by mouth daily    ONDANSETRON (ZOFRAN-ODT) 4 MG DISINTEGRATING TABLET    Take 1 tablet (4 mg) by mouth every 6 (six) hours as needed for Nausea    ONDANSETRON (ZOFRAN-ODT) 4 MG DISINTEGRATING TABLET    Take 1 tablet (4 mg) by mouth every 6 (six) hours as needed for Nausea    PRENATAL MV-MIN-FE FUM-FA-DHA (PRENATAL 1 PO)    Take by mouth    PROMETHAZINE (PHENERGAN) 25 MG TABLET    Take 1 tablet (25 mg) by  mouth every 6 (six) hours as needed for Nausea    SENNA-DOCUSATE (PERICOLACE) 8.6-50 MG PER TABLET    Take 1 tablet by mouth nightly as needed for Constipation    TRAZODONE (DESYREL) 50 MG TABLET    Take 1 tablet (50 mg) by mouth nightly         REVIEW OF SYSTEMS   Review of Systems See History of Present Illness  PHYSICAL EXAM     ED Triage Vitals [03/26/23 0944]   Encounter Vitals Group      BP 136/81      Systolic BP Percentile       Diastolic BP Percentile       Heart Rate 85      Resp Rate 20      Temp 98.2 F (36.8 C)      Temp src Oral      SpO2 96 %      Weight 118 kg      Height 1.626 m      Head Circumference       Peak Flow       Pain Score 10      Pain Loc       Pain Education       Exclude from Growth Chart      Physical Exam   Vital Signs and Nursing notes reviewed.    Constitutional: well appearing, well hydrated, non toxic, comfortable  HEENT: NC/AT, moist mucus membranes, oropharynx clear, nose normal, Pupils equal and reactive, EOMI  Neck: Supple, non tender, no bony or midline tenderness, full ROM, no meningeal signs  Pulmonary: CTAB, no  wheezes rales or rhonchi. Normal work of breathing. No respiratory distress.  Cardiovascular: Regular Rate and Rhythm. No murmurs, rubs, or gallops.   Abdomen: Soft, non tender, non distended. No rebound, guarding, or rigidity  Extremities: no deformities, no edema, normal ROM, compartments soft, neurovascularly intact throughout  Skin: normal, no rash, lesions, wounds, edema or color change  Neuro: CN 2-12 intact grossly, no facial asymmetry, no focal weakness, AAOx3  Psych: normal mood and affect       MEDICAL DECISION MAKING     PRIMARY PROBLEM LIST      Acute illness/injury with risk to life or bodily function (based on differential diagnosis or evaluation) DIAGNOSIS:pelvic pain     Differential Diagnosis: Abdominal Pain: bowel obstruction, abscess, colitis, diverticulitis, appendicitis, peritonitis, bowel perforation, mesenteric ischemia, abdominal aortic aneurysm, cholecystitis, biliay colic, cholangitis  PID UTI endometriosis torsion cyst    DISCUSSION      33 year old with one week of pelvic pain consistent with previous history of endometriosis. Not improving with nsaids and tylenol. States norco usually helps but doesn't have any left at home. She is overall well appearing and comfortable without abd tenderness. Doubt cyst. Likely endometriosis pain. Labs reassuring. Feeling much better after dose of pain meds. Will plan to [place] to home with rx for norco and obgyn follow up.     If patient is being hospitalized is severe sepsis or septic shock suspected?: N/A          External Records Reviewed?: [place] Prescription Drug Monitor Reviewed, and no concerning prescriptions noted.    Additional Notes              ED Course as of 03/26/23 1258   Sat Mar 26, 2023   1255 Patient reassessed - she appears comfortable. States she is feeling better. Pain releived. Will plan to [place] to home.  Likely pain associated with known history of endometriosis. Will plan to  with obgyn follow up and pain meds. Return for new or  worsening symptoms.  [LP]      ED Course User Index  [LP] Gena Laski, Assunta Gambles, DO         Vital Signs: Reviewed the patient's vital signs.   Nursing Notes: Reviewed and utilized available nursing notes.  Medical Records Reviewed: Reviewed available past medical records.  Counseling: The emergency provider has spoken with the patient and discussed today's findings, in addition to providing specific details for the plan of care.  Questions are answered and there is agreement with the plan.      CARDIAC STUDIES    The following cardiac studies were independently interpreted by me the Emergency Medicine Provider.  For full cardiac study results please see chart.                                                  RADIOLOGY IMAGING STUDIES      No orders to display       EMERGENCY IMAGING STUDIES    The following imagine studies were independently interpreted by me (emergency medicine provider):                         EMERGENCY DEPT. MEDICATIONS      ED Medication Orders (From admission, onward)      Start Ordered     Status Ordering Provider    03/26/23 1130 03/26/23 1129  morphine injection 6 mg  Once        Route: Intravenous  Ordered Dose: 6 mg       Last MAR action: Given Kimmy Parish C    03/26/23 1051 03/26/23 1050  oxyCODONE-acetaminophen (PERCOCET) 5-325 MG per tablet 1 tablet  Once        Route: Oral  Ordered Dose: 1 tablet       Last MAR action: Given Lemon Whitacre C    03/26/23 1051 03/26/23 1050  ketorolac (TORADOL) injection 30 mg  Once        Route: Intravenous  Ordered Dose: 30 mg       Last MAR action: Given Murat Rideout C            LABORATORY RESULTS    Ordered and independently interpreted AVAILABLE laboratory tests.   Results       Procedure Component Value Units Date/Time    Comprehensive Metabolic Panel [[id number]]  (Abnormal) Collected: 03/26/23 1037    Specimen: Blood, Venous Updated: 03/26/23 1118     Glucose 85 mg/dL      BUN 14 mg/dL      Creatinine 0.9 mg/dL      Sodium 706 mEq/L       Potassium 3.6 mEq/L      Chloride 105 mEq/L      CO2 24 mEq/L      Calcium 9.4 mg/dL      Anion Gap 9.0     GFR >60.0 mL/min/1.73 m2      AST (SGOT) 22 U/L      ALT 19 U/L      Alkaline Phosphatase 93 U/L      Albumin 3.8 g/dL      Protein, Total 7.6 g/dL      Globulin 3.8 g/dL  Albumin/Globulin Ratio 1.0     Bilirubin, Total 0.4 mg/dL     Lipase [[id number]]  (Normal) Collected: 03/26/23 1037    Specimen: Blood, Venous Updated: 03/26/23 1118     Lipase 22 U/L     Serum Beta HCG Qualitative [[id number]]  (Normal) Collected: 03/26/23 1037    Specimen: Blood, Venous Updated: 03/26/23 1105     hCG Qualitative Negative    Urine HCG Qualitative [[id number]]  (Normal) Collected: 03/26/23 1036    Specimen: Urine, Clean Catch Updated: 03/26/23 1051     Urine HCG Qualitative Negative    Urinalysis with Reflex to Microscopic Exam and Culture [[id number]]  (Normal) Collected: 03/26/23 1036    Specimen: Urine, Clean Catch Updated: 03/26/23 1050     Urine Color Straw     Urine Clarity Clear     Urine Specific Gravity 1.019     Urine pH 6.5     Urine Leukocyte Esterase Negative     Urine Nitrite Negative     Urine Protein Negative     Urine Glucose Negative     Urine Ketones Negative mg/dL      Urine Urobilinogen Normal mg/dL      Urine Bilirubin Negative     Urine Blood Negative    Narrative:      Urine Microscopic not indicated    CBC with Differential (Order) [[id number]]  (Abnormal) Collected: 03/26/23 1037    Specimen: Blood, Venous Updated: 03/26/23 1049    Narrative:      The following orders were created for panel order CBC with Differential (Order).  Procedure                               Abnormality         Status                     ---------                               -----------         ------                     CBC with Differential (C.Marland KitchenMarland Kitchen[[id number]]  Abnormal            Final result                 Please view results for these tests on the individual orders.    CBC with Differential (Component) [[id number]]   (Abnormal) Collected: 03/26/23 1037    Specimen: Blood, Venous Updated: 03/26/23 1049     WBC 6.70 x10 3/uL      Hemoglobin 12.2 g/dL      Hematocrit 74.2 %      Platelet Count 305 x10 3/uL      MPV 8.5 fL      RBC 3.91 x10 6/uL      MCV 92.6 fL      MCH 31.2 pg      MCHC 33.7 g/dL      RDW 12 %      nRBC % 0.0 /100 WBC      Absolute nRBC 0.00 x10 3/uL      Preliminary Absolute Neutrophil Count 2.69 x10 3/uL      Neutrophils % 40.2 %      Lymphocytes % 49.1 %  Monocytes % 8.5 %      Eosinophils % 1.3 %      Basophils % 0.6 %      Immature Granulocytes % 0.3 %      Absolute Neutrophils 2.69 x10 3/uL      Absolute Lymphocytes 3.29 x10 3/uL      Absolute Monocytes 0.57 x10 3/uL      Absolute Eosinophils 0.09 x10 3/uL      Absolute Basophils 0.04 x10 3/uL      Absolute Immature Granulocytes 0.02 x10 3/uL               CRITICAL CARE/PROCEDURES    Procedures    DIAGNOSIS      Diagnosis:  Final diagnoses:   Pelvic pain in female       Disposition:  ED Disposition       ED Disposition   Discharge    Condition   --    Date/Time   Sat Mar 26, 2023 12:56 PM    Comment   Dillard Cannon Sudano discharge to home/self care.    Condition at disposition: Stable                 Prescriptions:  Patient's Medications   New Prescriptions    HYDROCODONE-ACETAMINOPHEN (NORCO) 5-325 MG PER TABLET    Take 1 tablet by mouth every 6 (six) hours as needed for Pain   Previous Medications    ACETAMINOPHEN (TYLENOL) 325 MG TABLET    Take 2 tablets (650 mg) by mouth every 6 (six) hours as needed for Pain    ALBUTEROL SULFATE HFA (PROVENTIL) 108 (90 BASE) MCG/ACT INHALER    Inhale 2 puffs into the lungs every 6 (six) hours as needed for Wheezing    AMLODIPINE (NORVASC) 2.5 MG TABLET    Take 1 tablet (2.5 mg) by mouth daily    BUTALBITAL-ACETAMINOPHEN-CAFFEINE (FIORICET) 50-325-40 MG PER TABLET    Take 1 tablet by mouth every 4 (four) hours as needed for Headaches    DIPHENHYDRAMINE HCL PO    Take by mouth    DOCUSATE SODIUM (COLACE) 100 MG  CAPSULE    Take 1 capsule (100 mg total) by mouth daily    ELAGOLIX SODIUM 200 MG TAB    Take by mouth    FAMOTIDINE (PEPCID) 20 MG TABLET    Take 20 mg by mouth every 12 (twelve) hours    HYDROCORTISONE 1 % OINTMENT    Apply topically 3 (three) times daily as needed (perineal care)    KETOROLAC (TORADOL) 10 MG TABLET    Take 1 tablet (10 mg) by mouth every 6 (six) hours as needed for Pain    LANOLIN OINTMENT    Apply topically every 2 (two) hours as needed for Dry Skin (nipple discomfort)    NORGESTIMATE-ETHINYL ESTRADIOL (ORTHO-CYCLEN) 0.25-35 MG-MCG PER TABLET    Take 1 tablet by mouth daily    ONDANSETRON (ZOFRAN-ODT) 4 MG DISINTEGRATING TABLET    Take 1 tablet (4 mg) by mouth every 6 (six) hours as needed for Nausea    ONDANSETRON (ZOFRAN-ODT) 4 MG DISINTEGRATING TABLET    Take 1 tablet (4 mg) by mouth every 6 (six) hours as needed for Nausea    PRENATAL MV-MIN-FE FUM-FA-DHA (PRENATAL 1 PO)    Take by mouth    PROMETHAZINE (PHENERGAN) 25 MG TABLET    Take 1 tablet (25 mg) by mouth every 6 (six) hours as needed for Nausea    SENNA-DOCUSATE (PERICOLACE) 8.6-50 MG  PER TABLET    Take 1 tablet by mouth nightly as needed for Constipation    TRAZODONE (DESYREL) 50 MG TABLET    Take 1 tablet (50 mg) by mouth nightly   Modified Medications    No medications on file   Discontinued Medications    No medications on file           This note was generated by the Epic EMR system/ Dragon speech recognition and may contain inherent errors or omissions not intended by the user. Grammatical errors, random word insertions, deletions and pronoun errors  are occasional consequences of this technology due to software limitations. Not all errors are caught or corrected. If there are questions or concerns about the content of this note or information contained within the body of this dictation they should be addressed directly with the author for clarification.             [1]   Past Surgical History:  Procedure Laterality Date     COLPOSCOPY  2018    TONSILLECTOMY  2019   [2]   Social History  Socioeconomic History    Marital status: Married   Tobacco Use    Smoking status: Former     Types: Cigars     Quit date: 2019     Years since quitting: 5.7    Smokeless tobacco: Never   Vaping Use    Vaping status: Never Used   Substance and Sexual Activity    Alcohol use: Not Currently     Comment: socially    Drug use: Never    Sexual activity: Yes     Partners: Male     Birth control/protection: None     Social Determinants of Health     Financial Resource Strain: Low Risk  (09/29/2022)    Overall Financial Resource Strain (CARDIA)     Difficulty of Paying Living Expenses: Not very hard   Food Insecurity: No Food Insecurity (12/06/2022)    Hunger Vital Sign     Worried About Running Out of Food in the Last Year: Never true     Ran Out of Food in the Last Year: Never true   Transportation Needs: No Transportation Needs (12/06/2022)    PRAPARE - Therapist, art (Medical): No     Lack of Transportation (Non-Medical): No   Physical Activity: Unknown (09/29/2022)    Exercise Vital Sign     Days of Exercise per Week: 5 days   Stress: No Stress Concern Present (09/29/2022)    [employer] of Occupational Health - Occupational Stress Questionnaire     Feeling of Stress : Not at all   Social Connections: Moderately Integrated (09/29/2022)    Social Connection and Isolation Panel [NHANES]     Frequency of Communication with Friends and Family: More than three times a week     Frequency of Social Gatherings with Friends and Family: Never     Attends Religious Services: More than 4 times per year     Active Member of [employer] or Organizations: No     Attends Banker Meetings: Never     Marital Status: Married   Catering manager Violence: Not At Risk (12/06/2022)    Humiliation, Afraid, Rape, and Kick questionnaire     Fear of Current or Ex-Partner: No     Emotionally Abused: No     Physically Abused: No     Sexually Abused: No    Housing  Stability: Low Risk  (12/06/2022)    Housing Stability Vital Sign     Unable to Pay for Housing in the Last Year: No     Number of Places Lived in the Last Year: 1     Unstable Housing in the Last Year: No   [3] No family history on file.       Dejanae Helser, Assunta Gambles, DO  03/26/23 1301

## 2023-06-14 ENCOUNTER — Emergency Department
Admission: EM | Admit: 2023-06-14 | Discharge: 2023-06-14 | Disposition: A | Discharge status: Home / Self Care | Payer: No Typology Code available for payment source | Attending: Emergency Medicine | Admitting: Emergency Medicine

## 2023-06-14 DIAGNOSIS — R110 Nausea: Secondary | ICD-10-CM | POA: Insufficient documentation

## 2023-06-14 DIAGNOSIS — R1030 Lower abdominal pain, unspecified: Secondary | ICD-10-CM | POA: Insufficient documentation

## 2023-06-14 LAB — URINE HCG QUALITATIVE: Urine HCG Qualitative: NEGATIVE

## 2023-06-14 LAB — URINALYSIS WITH REFLEX TO MICROSCOPIC EXAM - REFLEX TO CULTURE
Urine Bilirubin: NEGATIVE
Urine Blood: NEGATIVE
Urine Glucose: NEGATIVE
Urine Leukocyte Esterase: NEGATIVE
Urine Nitrite: NEGATIVE
Urine Specific Gravity: 1.029 (ref 1.001–1.035)
Urine Urobilinogen: NORMAL mg/dL (ref 0.2–2.0)
Urine pH: 6.5 (ref 5.0–8.0)

## 2023-06-14 LAB — LAB USE ONLY - URINE GRAY CULTURE HOLD TUBE

## 2023-06-14 MED ORDER — PROMETHAZINE HCL 25 MG PO TABS
25.0000 mg | ORAL_TABLET | Freq: Four times a day (QID) | ORAL | 0 refills | Status: AC | PRN
Start: 2023-06-14 — End: ?

## 2023-06-14 MED ORDER — HYDROCODONE-ACETAMINOPHEN 5-325 MG PO TABS
1.0000 | ORAL_TABLET | Freq: Four times a day (QID) | ORAL | 0 refills | Status: AC | PRN
Start: 2023-06-14 — End: 2023-06-21

## 2023-06-14 MED ORDER — OXYCODONE-ACETAMINOPHEN 5-325 MG PO TABS
2.0000 | ORAL_TABLET | Freq: Once | ORAL | Status: AC
Start: 2023-06-14 — End: 2023-06-14
  Administered 2023-06-14: 2 via ORAL
  Filled 2023-06-14: qty 2

## 2023-06-14 MED ORDER — PROMETHAZINE HCL 25 MG PO TABS
25.0000 mg | ORAL_TABLET | Freq: Once | ORAL | Status: AC
Start: 2023-06-14 — End: 2023-06-14
  Administered 2023-06-14: 25 mg via ORAL
  Filled 2023-06-14: qty 1

## 2023-06-14 NOTE — ED Notes (Signed)
 VSS, NAD, Pt given DCI and prescriptions, knows to fill medications and take as directed, understands to follow up with PCP and referred MD.  Hospital care, health status, disposition, and follow up were explained.  All questions answered and understood, p

## 2023-06-14 NOTE — Discharge Instructions (Signed)
 1. Return immediately if worse in any way.    2. Follow up with your primary medical doctor for recheck.  Return to the emergency deparment if unable to follow up as instructed for any reason.    3. If you have any issues or questions about follow up pleas

## 2023-06-14 NOTE — EDIE (Signed)
 [facility] NOTIFICATION 06/14/2023 14:29 Burek, Vanecia S DOB: 1990/11/09 MRN: [id number]    Criteria Met      5 ED Visits in 12 Months    Security and Safety  No Security Events were found.  ED Care Guidelines  There are currently no ED Care Guidelines f

## 2023-06-15 NOTE — ED Provider Notes (Signed)
 EMERGENCY DEPARTMENT NOTE     Patient initially seen and examined at   ED PHYSICIAN ASSIGNED       Date/Time Event User Comments    06/14/23 1436 Physician Assigned Cope, Fonnie Crookshanks D Malina Geers, Ozell BIRCH, MD assigned as Attending           ED MIDLEVEL (APP) ASSIGNED       None            HISTORY OF PRESENT ILLNESS       Chief Complaint: Abdominal Pain and Back Pain       History of Present Illness  The patient presents for evaluation of endometriosis.    She has been diagnosed with endometriosis and is currently experiencing persistent pain, which she had anticipated would subside. She is seeking a prescription for promethazine  to manage her nausea. She has been managing the pain with ibuprofen  800 and Tylenol , but these medications have not been effective in the past week. She has previously found relief with Norco, but she does not recall the dosage. Additionally, she has used Percocet and morphine  in the past, both of which were well-tolerated.    MEDICATIONS  Current: Ibuprofen , Tylenol , Norco, Percocet, morphine     Independent Historian (other than patient): No  Additional History Provided by Independent Historian:  MEDICAL HISTORY     Past Medical History:  Past Medical History:   Diagnosis Date    Asthma     Headache     PCOS (polycystic ovarian syndrome)        Past Surgical History:  Past Surgical History[1]    Social History:  Social History[2]    Family History:  Family History[3]    Outpatient Medication:  Discharge Medication List as of 06/14/2023  3:56 PM        CONTINUE these medications which have NOT CHANGED    Details   acetaminophen  (TYLENOL ) 325 MG tablet Take 2 tablets (650 mg) by mouth every 6 (six) hours as needed for Pain, Starting Sun 10/24/2022, E-Rx      albuterol  sulfate HFA (PROVENTIL ) 108 (90 Base) MCG/ACT inhaler Inhale 2 puffs into the lungs every 6 (six) hours as needed for Wheezing, Historical Med      amLODIPine (NORVASC) 2.5 MG tablet Take 1 tablet (2.5 mg) by mouth daily,  Historical Med      butalbital -acetaminophen -caffeine  (FIORICET) 50-325-40 MG per tablet Take 1 tablet by mouth every 4 (four) hours as needed for Headaches, Starting Sat 01/16/2022, E-Rx      DIPHENHYDRAMINE HCL PO Take by mouth, Historical Med      docusate sodium  (COLACE) 100 MG capsule Take 1 capsule (100 mg total) by mouth daily, Starting Mon 04/21/2020, No Print      Elagolix Sodium 200 MG Tab Take by mouth, Historical Med      famotidine (PEPCID) 20 MG tablet Take 20 mg by mouth every 12 (twelve) hours, Historical Med      hydrocortisone  1 % ointment Apply topically 3 (three) times daily as needed (perineal care), Starting Sun 04/20/2020, No Print      ketorolac  (TORADOL ) 10 MG tablet Take 1 tablet (10 mg) by mouth every 6 (six) hours as needed for Pain, Starting Mon 12/06/2022, E-Rx      lanolin ointment Apply topically every 2 (two) hours as needed for Dry Skin (nipple discomfort), Starting Sun 04/20/2020, No Print      norgestimate-ethinyl estradiol (ORTHO-CYCLEN) 0.25-35 MG-MCG per tablet Take 1 tablet by mouth daily, Historical Med      !!  ondansetron  (ZOFRAN -ODT) 4 MG disintegrating tablet Take 1 tablet (4 mg) by mouth every 6 (six) hours as needed for Nausea, Starting Thu 03/04/2022, E-Rx      !! ondansetron  (ZOFRAN -ODT) 4 MG disintegrating tablet Take 1 tablet (4 mg) by mouth every 6 (six) hours as needed for Nausea, Starting Thu 06/24/2022, E-Rx      Prenatal MV-Min-Fe Fum-FA-DHA (PRENATAL 1 PO) Take by mouth, Historical Med      senna-docusate (PERICOLACE) 8.6-50 MG per tablet Take 1 tablet by mouth nightly as needed for Constipation, Starting Sun 04/20/2020, No Print      traZODone (DESYREL) 50 MG tablet Take 1 tablet (50 mg) by mouth nightly, Historical Med       !! - Potential duplicate medications found. Please discuss with provider.            REVIEW OF SYSTEMS   Review of Systems See History of Present Illness  PHYSICAL EXAM     ED Triage Vitals [06/14/23 1431]   Encounter Vitals Group      BP (!)  142/91      Systolic BP Percentile       Diastolic BP Percentile       Heart Rate 91      Resp Rate 18      Temp 98.3 F (36.8 C)      Temp src Oral      SpO2 97 %      Weight 117.9 kg      Height 1.626 m      Head Circumference       Peak Flow       Pain Score 10      Pain Loc       Pain Education       Exclude from Growth Chart      Physical Exam   Physical Exam       Nursing note and vitals reviewed.  Constitutional:  Well developed, well nourished.  Awake & alert.    Head:  Atraumatic.  Normocephalic.    ENT:  Mucous membranes are moist and intact.  Patent airway.  Neck:  Supple.  No JVD.   Pulmonary/Chest:  No evidence of respiratory distress.    Abdominal:  Soft and non-distended.  There is lower abd tenderness.  No rebound, guarding, or rigidity.    Back:  No CVA tenderness. No swelling.  Skin:  Skin is warm and dry.  No diaphoresis.    Neurological:  Alert, awake, and appropriate.  Normal speech.   Psychiatric:  Good eye contact.  Normal interaction, affect, and behavior    MEDICAL DECISION MAKING     PRIMARY PROBLEM LIST      Acute illness/injury with risk to life or bodily function (based on differential diagnosis or evaluation) DIAGNOSIS:abdominal pain         DISCUSSION    Doubt acute SBI or surgical process  Assessment & Plan  1. Endometriosis.  Her urine test results are within normal limits. The primary objective is to manage her pain effectively. A prescription for Norco will be provided, to be taken as needed for pain relief. Additionally, a prescription for Percocet 2 tablets will be issued to avoid the need for intravenous administration. She is advised to continue her follow-up appointments with her gynecologist and inform them about her persistent pain. A prescription for promethazine  will also be provided to manage her nausea. All prescriptions will be sent to [facility] in [place] on [address]. If she  experiences fevers or any pain that deviates from her usual symptoms, she should seek  immediate medical attention.    If patient is being hospitalized is severe sepsis or septic shock suspected?: N/A          External Records Reviewed?: [place]  Prescription Drug Monitor Reviewed, and no concerning prescriptions noted.    Additional Notes                     Vital Signs: Reviewed the patient's vital signs.   Nursing Notes: Reviewed and utilized available nursing notes.  Medical Records Reviewed: Reviewed available past medical records.  Counseling: The emergency provider has spoken with the patient and discussed today's findings, in addition to providing specific details for the plan of care.  Questions are answered and there is agreement with the plan.      CARDIAC STUDIES    The following cardiac studies were independently interpreted by me the Emergency Medicine Provider.  For full cardiac study results please see chart.                                                  RADIOLOGY IMAGING STUDIES      No orders to display       EMERGENCY IMAGING STUDIES    The following imagine studies were independently interpreted by me (emergency medicine provider):                         EMERGENCY DEPT. MEDICATIONS      ED Medication Orders (From admission, onward)      Start Ordered     Status Ordering Provider    06/14/23 1554 06/14/23 1553  promethazine  (PHENERGAN ) tablet 25 mg  Once        Route: Oral  Ordered Dose: 25 mg       Last MAR action: Given Jodeci Roarty D    06/14/23 1553 06/14/23 1552  oxyCODONE -acetaminophen  (PERCOCET) 5-325 MG per tablet 2 tablet  Once        Route: Oral  Ordered Dose: 2 tablet       Last MAR action: Given Sharvil Hoey D            LABORATORY RESULTS    Ordered and independently interpreted AVAILABLE laboratory tests.   Results  Laboratory Studies  Urine test results are normal.     Results       Procedure Component Value Units Date/Time    Urine HCG Qualitative [[id number]]  (Normal) Collected: 06/14/23 1443    Specimen: Urine, Clean Catch Updated: 06/14/23 1453      Urine HCG Qualitative Negative    Urinalysis with Reflex to Microscopic Exam and Culture [[id number]]  (Abnormal) Collected: 06/14/23 1443    Specimen: Urine, Clean Catch Updated: 06/14/23 1453     Urine Color Straw     Urine Clarity Clear     Urine Specific Gravity 1.029     Urine pH 6.5     Urine Leukocyte Esterase Negative     Urine Nitrite Negative     Urine Protein 20= Trace     Urine Glucose Negative     Urine Ketones Trace mg/dL      Urine Urobilinogen Normal mg/dL      Urine Bilirubin Negative     Urine Blood Negative  RBC, UA 0-2 /hpf      Urine WBC 0-5 /hpf      Urine Squamous Epithelial Cells 0-5 /hpf      Urine Mucus Present              CRITICAL CARE/PROCEDURES    Procedures    DIAGNOSIS      Diagnosis:  Final diagnoses:   Lower abdominal pain       Disposition:  ED Disposition       ED Disposition   Discharge    Condition   --    Date/Time   Tue Jun 14, 2023  3:54 PM    Comment   Elleigh Cassetta Wynes discharge to home/self care.    Condition at disposition: Stable                 Prescriptions:  Discharge Medication List as of 06/14/2023  3:56 PM        START taking these medications    Details   HYDROcodone -acetaminophen  (NORCO) 5-325 MG per tablet Take 1 tablet by mouth every 6 (six) hours as needed for Pain, Starting Tue 06/14/2023, Until Tue 06/21/2023 at 2359, E-Rx           CONTINUE these medications which have CHANGED    Details   promethazine  (PHENERGAN ) 25 MG tablet Take 1 tablet (25 mg) by mouth every 6 (six) hours as needed for Nausea, Starting Tue 06/14/2023, E-Rx           CONTINUE these medications which have NOT CHANGED    Details   acetaminophen  (TYLENOL ) 325 MG tablet Take 2 tablets (650 mg) by mouth every 6 (six) hours as needed for Pain, Starting Sun 10/24/2022, E-Rx      albuterol  sulfate HFA (PROVENTIL ) 108 (90 Base) MCG/ACT inhaler Inhale 2 puffs into the lungs every 6 (six) hours as needed for Wheezing, Historical Med      amLODIPine (NORVASC) 2.5 MG tablet Take 1 tablet (2.5 mg)  by mouth daily, Historical Med      butalbital -acetaminophen -caffeine  (FIORICET) 50-325-40 MG per tablet Take 1 tablet by mouth every 4 (four) hours as needed for Headaches, Starting Sat 01/16/2022, E-Rx      DIPHENHYDRAMINE HCL PO Take by mouth, Historical Med      docusate sodium  (COLACE) 100 MG capsule Take 1 capsule (100 mg total) by mouth daily, Starting Mon 04/21/2020, No Print      Elagolix Sodium 200 MG Tab Take by mouth, Historical Med      famotidine (PEPCID) 20 MG tablet Take 20 mg by mouth every 12 (twelve) hours, Historical Med      hydrocortisone  1 % ointment Apply topically 3 (three) times daily as needed (perineal care), Starting Sun 04/20/2020, No Print      ketorolac  (TORADOL ) 10 MG tablet Take 1 tablet (10 mg) by mouth every 6 (six) hours as needed for Pain, Starting Mon 12/06/2022, E-Rx      lanolin ointment Apply topically every 2 (two) hours as needed for Dry Skin (nipple discomfort), Starting Sun 04/20/2020, No Print      norgestimate-ethinyl estradiol (ORTHO-CYCLEN) 0.25-35 MG-MCG per tablet Take 1 tablet by mouth daily, Historical Med      !! ondansetron  (ZOFRAN -ODT) 4 MG disintegrating tablet Take 1 tablet (4 mg) by mouth every 6 (six) hours as needed for Nausea, Starting Thu 03/04/2022, E-Rx      !! ondansetron  (ZOFRAN -ODT) 4 MG disintegrating tablet Take 1 tablet (4 mg) by mouth every 6 (six) hours  as needed for Nausea, Starting Thu 06/24/2022, E-Rx      Prenatal MV-Min-Fe Fum-FA-DHA (PRENATAL 1 PO) Take by mouth, Historical Med      senna-docusate (PERICOLACE) 8.6-50 MG per tablet Take 1 tablet by mouth nightly as needed for Constipation, Starting Sun 04/20/2020, No Print      traZODone (DESYREL) 50 MG tablet Take 1 tablet (50 mg) by mouth nightly, Historical Med       !! - Potential duplicate medications found. Please discuss with provider.              This note was generated by the Epic EMR system/ Dragon speech recognition and may contain inherent errors or omissions not intended by the  user. Grammatical errors, random word insertions, deletions and pronoun errors  are occasional consequences of this technology due to software limitations. Not all errors are caught or corrected. If there are questions or concerns about the content of this note or information contained within the body of this dictation they should be addressed directly with the author for clarification.           [1]   Past Surgical History:  Procedure Laterality Date    COLPOSCOPY  2018    TONSILLECTOMY  2019   [2]   Social History  Socioeconomic History    Marital status: Married   Tobacco Use    Smoking status: Former     Types: Cigars     Quit date: 2019     Years since quitting: 5.9    Smokeless tobacco: Never   Vaping Use    Vaping status: Never Used   Substance and Sexual Activity    Alcohol use: Not Currently     Comment: socially    Drug use: Never    Sexual activity: Yes     Partners: Male     Birth control/protection: None     Social Drivers of Health     Financial Resource Strain: Low Risk  (09/29/2022)    Overall Financial Resource Strain (CARDIA)     Difficulty of Paying Living Expenses: Not very hard   Food Insecurity: No Food Insecurity (06/14/2023)    Hunger Vital Sign     Worried About Running Out of Food in the Last Year: Never true     Ran Out of Food in the Last Year: Never true   Transportation Needs: No Transportation Needs (06/14/2023)    PRAPARE - Therapist, Art (Medical): No     Lack of Transportation (Non-Medical): No   Physical Activity: Unknown (09/29/2022)    Exercise Vital Sign     Days of Exercise per Week: 5 days   Stress: No Stress Concern Present (09/29/2022)    [employer] of Occupational Health - Occupational Stress Questionnaire     Feeling of Stress : Not at all   Social Connections: Moderately Integrated (09/29/2022)    Social Connection and Isolation Panel [NHANES]     Frequency of Communication with Friends and Family: More than three times a week     Frequency of  Social Gatherings with Friends and Family: Never     Attends Religious Services: More than 4 times per year     Active Member of [employer] or Organizations: No     Attends Banker Meetings: Never     Marital Status: Married   Catering Manager Violence: Not At Risk (06/14/2023)    Humiliation, Afraid, Rape, and Kick questionnaire  Fear of Current or Ex-Partner: No     Emotionally Abused: No     Physically Abused: No     Sexually Abused: No   Housing Stability: Low Risk  (06/14/2023)    Housing Stability Vital Sign     Unable to Pay for Housing in the Last Year: No     Number of Times Moved in the Last Year: 1     Homeless in the Last Year: No   [3] No family history on file.       Aretha Ozell BIRCH, MD  06/15/23 2203

## 2023-06-17 ENCOUNTER — Emergency Department
Admission: EM | Admit: 2023-06-17 | Discharge: 2023-06-17 | Disposition: A | Discharge status: Home / Self Care | Payer: No Typology Code available for payment source | Attending: Emergency Medicine | Admitting: Emergency Medicine

## 2023-06-17 DIAGNOSIS — R102 Pelvic and perineal pain: Secondary | ICD-10-CM | POA: Insufficient documentation

## 2023-06-17 DIAGNOSIS — G8929 Other chronic pain: Secondary | ICD-10-CM | POA: Insufficient documentation

## 2023-06-17 MED ORDER — MORPHINE SULFATE 4 MG/ML IJ/IV SOLN (WRAP)
4.0000 mg | Freq: Once | Status: AC
  Administered 2023-06-17: 4 mg via INTRAMUSCULAR
  Filled 2023-06-17: qty 1

## 2023-06-17 MED ORDER — ONDANSETRON 4 MG PO TBDP
4.0000 mg | ORAL_TABLET | Freq: Once | ORAL | Status: AC
Start: 2023-06-17 — End: 2023-06-17
  Administered 2023-06-17: 4 mg via ORAL
  Filled 2023-06-17: qty 1

## 2023-06-17 NOTE — EDIE (Signed)
 [facility] NOTIFICATION 06/17/2023 08:02 [name], [name] DOB: 03-Nov-1990 MRN: [id number]    Criteria Met      5 ED Visits in 12 Months    Security and Safety  No Security Events were found.  ED Care Guidelines  There are currently no ED Care Guidelines f

## 2023-06-17 NOTE — ED Notes (Signed)
 MD at bedside.

## 2023-06-17 NOTE — ED Provider Notes (Signed)
 EMERGENCY DEPARTMENT NOTE     Patient initially seen and examined at   ED PHYSICIAN ASSIGNED       Date/Time Event User Comments    06/17/23 0807 Physician Assigned Candace Cruise, DO assigned as Attending           ED MIDLEVEL (APP) ASSIG

## 2023-07-17 ENCOUNTER — Emergency Department
Admission: EM | Admit: 2023-07-17 | Discharge: 2023-07-17 | Disposition: A | Discharge status: Home / Self Care | Payer: No Typology Code available for payment source | Attending: Emergency Medicine | Admitting: Emergency Medicine

## 2023-07-17 ENCOUNTER — Emergency Department: Payer: No Typology Code available for payment source

## 2023-07-17 DIAGNOSIS — R1030 Lower abdominal pain, unspecified: Secondary | ICD-10-CM

## 2023-07-17 DIAGNOSIS — J102 Influenza due to other identified influenza virus with gastrointestinal manifestations: Secondary | ICD-10-CM | POA: Insufficient documentation

## 2023-07-17 DIAGNOSIS — J101 Influenza due to other identified influenza virus with other respiratory manifestations: Secondary | ICD-10-CM

## 2023-07-17 DIAGNOSIS — N898 Other specified noninflammatory disorders of vagina: Secondary | ICD-10-CM | POA: Insufficient documentation

## 2023-07-17 DIAGNOSIS — J09X3 Influenza due to identified novel influenza A virus with gastrointestinal manifestations: Secondary | ICD-10-CM | POA: Insufficient documentation

## 2023-07-17 DIAGNOSIS — Z87891 Personal history of nicotine dependence: Secondary | ICD-10-CM | POA: Insufficient documentation

## 2023-07-17 HISTORY — DX: Endometriosis, unspecified: N80.9

## 2023-07-17 LAB — URINALYSIS WITH REFLEX TO MICROSCOPIC EXAM - REFLEX TO CULTURE
Urine Bilirubin: NEGATIVE
Urine Blood: NEGATIVE
Urine Glucose: NEGATIVE
Urine Nitrite: NEGATIVE
Urine Specific Gravity: 1.03 (ref 1.001–1.035)
Urine Urobilinogen: NORMAL mg/dL (ref 0.2–2.0)
Urine pH: 6 (ref 5.0–8.0)

## 2023-07-17 LAB — LAB USE ONLY - CBC WITH DIFFERENTIAL
Absolute Basophils: 0.03 10*3/uL (ref 0.00–0.08)
Absolute Eosinophils: 0 10*3/uL (ref 0.00–0.44)
Absolute Immature Granulocytes: 0.01 10*3/uL (ref 0.00–0.07)
Absolute Lymphocytes: 1.37 10*3/uL (ref 0.42–3.22)
Absolute Monocytes: 1.01 10*3/uL — ABNORMAL HIGH (ref 0.21–0.85)
Absolute Neutrophils: 3.07 10*3/uL (ref 1.10–6.33)
Absolute nRBC: 0 10*3/uL (ref <=0.00)
Basophils %: 0.5 %
Eosinophils %: 0 %
Hematocrit: 39.1 % (ref 34.7–43.7)
Hemoglobin: 13.1 g/dL (ref 11.4–14.8)
Immature Granulocytes %: 0.2 %
Lymphocytes %: 25 %
MCH: 30.7 pg (ref 25.1–33.5)
MCHC: 33.5 g/dL (ref 31.5–35.8)
MCV: 91.6 fL (ref 78.0–96.0)
MPV: 8.7 fL — ABNORMAL LOW (ref 8.9–12.5)
Monocytes %: 18.4 %
Neutrophils %: 55.9 %
Platelet Count: 271 10*3/uL (ref 142–346)
Preliminary Absolute Neutrophil Count: 3.07 10*3/uL (ref 1.10–6.33)
RBC: 4.27 10*6/uL (ref 3.90–5.10)
RDW: 13 % (ref 11–15)
WBC: 5.49 10*3/uL (ref 3.10–9.50)
nRBC %: 0 /100{WBCs} (ref <=0.0)

## 2023-07-17 LAB — COMPREHENSIVE METABOLIC PANEL
ALT: 31 U/L (ref <=55)
AST (SGOT): 31 U/L (ref <=41)
Albumin/Globulin Ratio: 1 (ref 0.9–2.2)
Albumin: 4.3 g/dL (ref 3.5–5.0)
Alkaline Phosphatase: 91 U/L (ref 37–117)
Anion Gap: 13 (ref 5.0–15.0)
BUN: 14 mg/dL (ref 7–21)
Bilirubin, Total: 0.7 mg/dL (ref 0.2–1.2)
CO2: 24 meq/L (ref 17–29)
Calcium: 10 mg/dL (ref 8.5–10.5)
Chloride: 99 meq/L (ref 99–111)
Creatinine: 1 mg/dL (ref 0.4–1.0)
GFR: >60 mL/min/{1.73_m2} (ref >=60.0)
Globulin: 4.3 g/dL — ABNORMAL HIGH (ref 2.0–3.6)
Glucose: 87 mg/dL (ref 70–100)
Potassium: 3.5 meq/L (ref 3.5–5.3)
Protein, Total: 8.6 g/dL — ABNORMAL HIGH (ref 6.0–8.3)
Sodium: 136 meq/L (ref 135–145)

## 2023-07-17 LAB — COVID-19 (SARS-COV-2) & INFLUENZA  A/B, NAA (ROCHE LIAT)
Influenza A RNA: DETECTED — AB
Influenza B RNA: NOT DETECTED
SARS-CoV-2 (COVID-19) RNA: NOT DETECTED

## 2023-07-17 LAB — LACTIC ACID
Lactic Acid: 0.9 mmol/L (ref 0.2–2.0)
Lactic Acid: 1.2 mmol/L (ref 0.2–2.0)

## 2023-07-17 LAB — LIPASE: Lipase: 23 U/L (ref 8–78)

## 2023-07-17 LAB — URINE HCG QUALITATIVE: Urine HCG Qualitative: NEGATIVE

## 2023-07-17 MED ORDER — ONDANSETRON HCL 4 MG/2ML IJ SOLN
4.0000 mg | Freq: Once | INTRAMUSCULAR | Status: AC
Start: 2023-07-17 — End: 2023-07-17
  Administered 2023-07-17: 4 mg via INTRAVENOUS

## 2023-07-17 MED ORDER — KETOROLAC TROMETHAMINE 30 MG/ML IJ SOLN
15.0000 mg | Freq: Once | INTRAMUSCULAR | Status: AC
Start: 2023-07-17 — End: 2023-07-17
  Administered 2023-07-17: 15 mg via INTRAVENOUS

## 2023-07-17 MED ORDER — FLUCONAZOLE 150 MG PO TABS
150.0000 mg | ORAL_TABLET | Freq: Once | ORAL | 0 refills | Status: AC | PRN
Start: 2023-07-17 — End: 2023-07-18

## 2023-07-17 MED ORDER — IBUPROFEN 600 MG PO TABS
600.0000 mg | ORAL_TABLET | Freq: Four times a day (QID) | ORAL | 0 refills | Status: DC | PRN
Start: 2023-07-17 — End: 2023-10-24

## 2023-07-17 MED ORDER — IOHEXOL 350 MG/ML IV SOLN
100.0000 mL | Freq: Once | INTRAVENOUS | Status: AC | PRN
Start: 2023-07-17 — End: 2023-07-17
  Administered 2023-07-17: 100 mL via INTRAVENOUS

## 2023-07-17 MED ORDER — MORPHINE SULFATE 4 MG/ML IJ/IV SOLN (WRAP)
4.0000 mg | Freq: Once | Status: AC
Start: 2023-07-17 — End: 2023-07-17
  Administered 2023-07-17: 4 mg via INTRAVENOUS
  Filled 2023-07-17: qty 1

## 2023-07-17 MED ORDER — SODIUM CHLORIDE 0.9 % IV BOLUS
1000.0000 mL | Freq: Once | INTRAVENOUS | Status: AC
Start: 2023-07-17 — End: 2023-07-17
  Administered 2023-07-17: 1000 mL via INTRAVENOUS

## 2023-07-17 MED ORDER — ACETAMINOPHEN 500 MG PO TABS
1000.0000 mg | ORAL_TABLET | Freq: Once | ORAL | Status: AC
Start: 2023-07-17 — End: 2023-07-17
  Administered 2023-07-17: 1000 mg via ORAL
  Filled 2023-07-17: qty 2

## 2023-07-17 NOTE — Discharge Instructions (Signed)
Please follow-up with your PCP and your OB/GYN.  Return to emergency room for any new or worsening symptoms.

## 2023-07-17 NOTE — EDIE (Signed)
 [facility] NOTIFICATION 07/17/2023 12:48 Angela [name], Angela [name] DOB: [birth year] MRN: [id number]    Criteria Met      5 ED Visits in 12 Months    Security and Safety  No Security Events were found.  ED Care Guidelines  There are currently no ED Care Guidelines for this patient. Please check your facility'[name] medical records system.        Prescription Drug Data  No Prescription Drug Data was found.    E.D. Visit Count (12 mo.)  Facility Visits   [place] Emergency Room: [facility] 7   Total 7   Note: Visits indicate total known visits.     Recent Emergency Department Visit Summary  Date Facility [facility] Type Diagnoses or Chief Complaint    Jul 17, 2023  [facility] Emergency Room: [facility] [place]  [place]  Emergency      Abdominal Pain      Jun 17, 2023  [place] Emergency Room: [facility].  [place]  Emergency      Pelvic and perineal pain      Other chronic pain      Abdominal pain      Jun 14, 2023  [place] Emergency Room: [facility].  [place]  Emergency      Lower abdominal pain, unspecified      Abdominal Pain      Back Pain      Abdominal pain, Back pain      Mar 26, 2023  [place] Emergency Room: [facility].  [place]  Emergency      Pelvic and perineal pain      Abdominal Pain      Abdominal pain, Back pain      Dec 06, 2022  [place] Emergency Room: [facility].  [place]  Emergency      Pelvic and perineal pain      Back Pain      Abdominal Pain      abdominal pain, back pain      Oct 24, 2022  [place] Emergency Room: [facility].  [place]  Emergency      Cystitis, unspecified without hematuria      Dysmenorrhea, unspecified      Abdominal Pain      Vaginal Bleeding      abdominal pain; emesis      Sep 29, 2022  [place] Emergency Room: [facility].  [place]  Emergency      Polycystic ovarian syndrome      Unspecified ovarian cyst, left side      Left lower quadrant pain      Abdominal Pain        Recent Inpatient Visit Summary  No Recent Inpatient  Visits were found.  Care Team  No Care Team was found.  [facility]  This patient has registered at the [facility] Emergency Room: [facility] Emergency Department  For more information visit: [email or web address]     PLEASE NOTE:     1.   Any care recommendations and other clinical information are provided as guidelines or for historical purposes only, and providers should exercise their own clinical judgment when providing care.    2.   You may only use this information for purposes of treatment, payment or health care operations activities, and subject to the limitations of applicable [facility] Policies.    3.   You should consult directly with the  organization that provided a care guideline or other clinical history with any questions about additional information or accuracy or completeness of information provided.    ? 2025 [facility] - [email or web address]

## 2023-07-17 NOTE — ED Provider Notes (Signed)
 EMERGENCY DEPARTMENT ATTENDING PHYSICIAN NOTE     I performed the substantive portion of the MDM. For the problems addressed, I personally developed, reviewed, and/or approved the plan and assessment as documented by the APP.  Patient was seen by APP, see their note for further documentation of history and exam.    BRIEF HISTORY OF PRESENT ILLNESS AND ADDITIONAL EXAM FINDINGS     Chief Complaint: Abdominal Pain       History per APP    33 y.o. female presents with abd pain    Triage Vitals:  ED Triage Vitals   Encounter Vitals Group      BP 07/17/23 1304 135/75      Systolic BP Percentile --       Diastolic BP Percentile --       Heart Rate 07/17/23 1304 (!) 129      Resp Rate 07/17/23 1304 18      Temp 07/17/23 1304 (!) 100.7 F (38.2 C)      Temp src 07/17/23 1304 Oral      SpO2 07/17/23 1304 98 %      Weight 07/17/23 1301 117.9 kg      Height 07/17/23 1301 1.626 m      Head Circumference --       Peak Flow --       Pain Score 07/17/23 1301 10      Pain Loc --       Pain Education --       Exclude from Growth Chart --             MEDICAL DECISION MAKING           Vital Signs: Reviewed the patient's vital signs.   Nursing Notes: Reviewed and utilized available nursing notes.  Medical Records Reviewed: Reviewed available past medical records.    CARDIAC STUDIES    The following cardiac studies were independently interpreted by me the Emergency Medicine Physician.  For full cardiac study results please see chart. I discussed testing results with the APP.              EMERGENCY IMAGING STUDIES    The following imagine studies were independently interpreted by me (emergency medicine physician). I discussed testing results with the APP.                     RADIOLOGY IMAGING STUDIES      No orders to display       EMERGENCY DEPT. MEDICATIONS      ED Medication Orders (From admission, onward)      None            LABORATORY RESULTS    Ordered and independently interpreted AVAILABLE laboratory tests.   Results       ** No  results found for the last 24 hours. **              CRITICAL CARE/PROCEDURES        DIAGNOSIS    I (ED Physician) discussed final disposition with the APP    Diagnosis:  Final diagnoses:   None       Disposition:  ED Disposition       None            Prescriptions:  Patient's Medications   New Prescriptions    No medications on file   Previous Medications    ACETAMINOPHEN  (TYLENOL ) 325 MG TABLET    Take 2 tablets (650 mg) by  mouth every 6 (six) hours as needed for Pain    ALBUTEROL  SULFATE HFA (PROVENTIL ) 108 (90 BASE) MCG/ACT INHALER    Inhale 2 puffs into the lungs every 6 (six) hours as needed for Wheezing    AMLODIPINE (NORVASC) 2.5 MG TABLET    Take 1 tablet (2.5 mg) by mouth daily    BUTALBITAL -ACETAMINOPHEN -CAFFEINE  (FIORICET) 50-325-40 MG PER TABLET    Take 1 tablet by mouth every 4 (four) hours as needed for Headaches    DIPHENHYDRAMINE HCL PO    Take by mouth    DOCUSATE SODIUM  (COLACE) 100 MG CAPSULE    Take 1 capsule (100 mg total) by mouth daily    ELAGOLIX SODIUM 200 MG TAB    Take by mouth    FAMOTIDINE (PEPCID) 20 MG TABLET    Take 20 mg by mouth every 12 (twelve) hours    HYDROCORTISONE  1 % OINTMENT    Apply topically 3 (three) times daily as needed (perineal care)    KETOROLAC  (TORADOL ) 10 MG TABLET    Take 1 tablet (10 mg) by mouth every 6 (six) hours as needed for Pain    LANOLIN OINTMENT    Apply topically every 2 (two) hours as needed for Dry Skin (nipple discomfort)    NORGESTIMATE-ETHINYL ESTRADIOL (ORTHO-CYCLEN) 0.25-35 MG-MCG PER TABLET    Take 1 tablet by mouth daily    ONDANSETRON  (ZOFRAN -ODT) 4 MG DISINTEGRATING TABLET    Take 1 tablet (4 mg) by mouth every 6 (six) hours as needed for Nausea    ONDANSETRON  (ZOFRAN -ODT) 4 MG DISINTEGRATING TABLET    Take 1 tablet (4 mg) by mouth every 6 (six) hours as needed for Nausea    PRENATAL MV-MIN-FE FUM-FA-DHA (PRENATAL 1 PO)    Take by mouth    PROMETHAZINE  (PHENERGAN ) 25 MG TABLET    Take 1 tablet (25 mg) by mouth every 6 (six) hours as needed  for Nausea    SENNA-DOCUSATE (PERICOLACE) 8.6-50 MG PER TABLET    Take 1 tablet by mouth nightly as needed for Constipation    TRAZODONE (DESYREL) 50 MG TABLET    Take 1 tablet (50 mg) by mouth nightly   Modified Medications    No medications on file   Discontinued Medications    No medications on file          Caro Fairy NOVAK, MD  07/17/23 1348

## 2023-07-17 NOTE — ED Provider Notes (Signed)
 EMERGENCY DEPARTMENT NOTE     Patient initially seen and examined at   ED PHYSICIAN ASSIGNED       Date/Time Event User Comments    07/17/23 1348 Physician Assigned MARFORI, [name] [name], [name], MD assigned as Attending           ED [facility] (APP) ASSIGNED       Date/Time Event User Comments    07/17/23 1340 PA/NP Provider Assigned Weatherford, Aaryanna Hyden A Kayson Tasker, Glendia LABOR, PA assigned as Physician Assistant            HISTORY OF PRESENT ILLNESS       Chief Complaint: Abdominal Pain       33 y.o. female with endometriosis PCOS and asthma presents to the emergency room for lower abdominal pain, nausea and vomiting.  Reports that she has pain in her bilateral lower abdomen that is constant, sharp, nonradiating without alleviating provoking factors. States that she thinks it is an endometriosis flare since it feels similar in the past.  Has some nausea and has had a couple episodes of nonbloody, nonbilious emesis. Also notes some vaginal discharge and vaginal itching which she thinks is a yeast infection.  She just finished antibiotics for sinus infection with resolution of symptoms. LMP 01/2023, she has regular periods.  On OCPs. Denies any sexual partners or concern for STDs.  Denies history abdominal surgeries.  Reports regular BM.      Independent Historian (other than patient): No  Additional History Provided by Independent Historian:  MEDICAL HISTORY     Past Medical History:  Past Medical History:   Diagnosis Date    Asthma     Endometriosis     Headache     PCOS (polycystic ovarian syndrome)        Past Surgical History:  Past Surgical History[1]    Social History:  Social History[2]    Family History:  Family History[3]    Outpatient Medication:  Discharge Medication List as of 07/17/2023  5:14 PM        CONTINUE these medications which have NOT CHANGED    Details   acetaminophen  (TYLENOL ) 325 MG tablet Take 2 tablets (650 mg) by mouth every 6 (six) hours as needed for Pain, Starting Sun 10/24/2022, E-Rx       albuterol  sulfate HFA (PROVENTIL ) 108 (90 Base) MCG/ACT inhaler Inhale 2 puffs into the lungs every 6 (six) hours as needed for Wheezing, Historical Med      amLODIPine (NORVASC) 2.5 MG tablet Take 1 tablet (2.5 mg) by mouth daily, Historical Med      butalbital -acetaminophen -caffeine  (FIORICET) 50-325-40 MG per tablet Take 1 tablet by mouth every 4 (four) hours as needed for Headaches, Starting Sat 01/16/2022, E-Rx      DIPHENHYDRAMINE HCL PO Take by mouth, Historical Med      docusate sodium  (COLACE) 100 MG capsule Take 1 capsule (100 mg total) by mouth daily, Starting Mon 04/21/2020, No Print      Elagolix Sodium 200 MG Tab Take by mouth, Historical Med      famotidine (PEPCID) 20 MG tablet Take 20 mg by mouth every 12 (twelve) hours, Historical Med      hydrocortisone  1 % ointment Apply topically 3 (three) times daily as needed (perineal care), Starting Sun 04/20/2020, No Print      ketorolac  (TORADOL ) 10 MG tablet Take 1 tablet (10 mg) by mouth every 6 (six) hours as needed for Pain, Starting Mon 12/06/2022, E-Rx  lanolin ointment Apply topically every 2 (two) hours as needed for Dry Skin (nipple discomfort), Starting Sun 04/20/2020, No Print      norgestimate-ethinyl estradiol (ORTHO-CYCLEN) 0.25-35 MG-MCG per tablet Take 1 tablet by mouth daily, Historical Med      !! ondansetron  (ZOFRAN -ODT) 4 MG disintegrating tablet Take 1 tablet (4 mg) by mouth every 6 (six) hours as needed for Nausea, Starting Thu 03/04/2022, E-Rx      !! ondansetron  (ZOFRAN -ODT) 4 MG disintegrating tablet Take 1 tablet (4 mg) by mouth every 6 (six) hours as needed for Nausea, Starting Thu 06/24/2022, E-Rx      Prenatal MV-Min-Fe Fum-FA-DHA (PRENATAL 1 PO) Take by mouth, Historical Med      promethazine  (PHENERGAN ) 25 MG tablet Take 1 tablet (25 mg) by mouth every 6 (six) hours as needed for Nausea, Starting Tue 06/14/2023, E-Rx      senna-docusate (PERICOLACE) 8.6-50 MG per tablet Take 1 tablet by mouth nightly as needed for  Constipation, Starting Sun 04/20/2020, No Print      traZODone (DESYREL) 50 MG tablet Take 1 tablet (50 mg) by mouth nightly, Historical Med       !! - Potential duplicate medications found. Please discuss with provider.            REVIEW OF SYSTEMS   Review of Systems See History of Present Illness  PHYSICAL EXAM     ED Triage Vitals   Encounter Vitals Group      BP 07/17/23 1304 135/75      Systolic BP Percentile --       Diastolic BP Percentile --       Heart Rate 07/17/23 1304 (!) 129      Resp Rate 07/17/23 1304 18      Temp 07/17/23 1304 (!) 100.7 F (38.2 C)      Temp src 07/17/23 1304 Oral      SpO2 07/17/23 1304 98 %      Weight 07/17/23 1301 117.9 kg      Height 07/17/23 1301 1.626 m      Head Circumference --       Peak Flow --       Pain Score 07/17/23 1301 10      Pain Loc --       Pain Education --       Exclude from Growth Chart --      Physical Exam  Vitals and nursing note reviewed.   Constitutional:       Appearance: Normal appearance.   HENT:      Head: Normocephalic and atraumatic.      Nose: Nose normal.      Mouth/Throat:      Mouth: Mucous membranes are moist.      Pharynx: Oropharynx is clear.   Eyes:      Conjunctiva/sclera: Conjunctivae normal.   Cardiovascular:      Rate and Rhythm: Normal rate and regular rhythm.      Heart sounds: Normal heart sounds.   Pulmonary:      Effort: Pulmonary effort is normal.      Breath sounds: Normal breath sounds.   Abdominal:      Palpations: Abdomen is soft.      Tenderness: There is abdominal tenderness in the right lower quadrant and left lower quadrant. There is no right CVA tenderness, left CVA tenderness, guarding or rebound.   Musculoskeletal:      Cervical back: Normal range of motion and neck supple. No rigidity  or tenderness.   Lymphadenopathy:      Cervical: No cervical adenopathy.   Skin:     General: Skin is warm.      Findings: No rash.   Neurological:      Mental Status: She is alert and oriented to person, place, and time.   Psychiatric:          Mood and Affect: Mood normal.         Behavior: Behavior normal.          MEDICAL DECISION MAKING     PRIMARY PROBLEM LIST      Acute illness/injury with risk to life or bodily function (based on differential diagnosis or evaluation) DIAGNOSIS: Lower abdominal pain, influenza A, and vaginal irritation     Ddx includes but not limited to viral gastroenteritis, appendicitis, diverticulitis, bowel obstruction, colitis, UTI, PID, TOA, yeast infection, BV, ovarian torsion    DISCUSSION      33 year old female here for bilateral lower abdominal pain, vomiting and vaginal irritation.  Patient initially tachycardic and febrile here, which resolved with fluids and antipyretics.  No leukocytosis or elevated lactic acid unlikely sepsis.   Patient has minimal bilateral lower abdominal tenderness without peritoneal signs on exam.  CT abdomen pelvis shows no acute intra-abdominal pathology. Ultrasound pelvis shows no evidence of TOA or ovarian torsion.  CMP unremarkable.  Lipase normal.  Patient declines pelvic exam and has no risk factors for PID, but will send self swab GC/C and trichomonas.  Patient offered other STD testing, declines.  UA does appear to be contaminated with some WBCs and WBC esterase, but patient has no UTI symptoms so will hold off on antibiotic treatment and send for urine for culture. Urine pregnancy negative.  Pain control in ED.      1710 Rechecked patient.  States that she feels better after meds.  Sitting comfortably on gurney.  VS improved.      Stable for outpatient management and discharge.  Patient positive for influenza A, suspect this is source of fever. Recommend supportive care, increase hydration and rest.  Will treat vaginal irritation with fluconazole  with GC/C pending.  Instructed follow-up with PCP in 2 to 3 days.  Given strict ED return precautions.  Patient verbalizes understanding and agrees plan.    Discussed case with ED attending, Dr. DOROTHA [name], who is agreeable to the plan  of care, treatment and disposition.           If patient is being hospitalized is severe sepsis or septic shock suspected?: N/A                  External Records Reviewed?: N/A    Additional Notes                     Vital Signs: Reviewed the patient's vital signs.   Nursing Notes: Reviewed and utilized available nursing notes.  Medical Records Reviewed: Reviewed available past medical records.  Counseling: The emergency provider has spoken with the patient and discussed today's findings, in addition to providing specific details for the plan of care.  Questions are answered and there is agreement with the plan.      CARDIAC STUDIES    The following cardiac studies were independently interpreted by me the Emergency Medicine Provider.  For full cardiac study results please see chart.  RADIOLOGY IMAGING STUDIES      CT Abd/Pelvis with IV Contrast   Final Result    No abnormal findings.      Deward Holt, MD   07/17/2023 4:25 PM      US  Pelvis with Doppler Ltd (Transabdominal, r/o torsion)   Final Result      Normal pelvic ultrasound       Norleen Ruth, MD   07/17/2023 3:55 PM          EMERGENCY IMAGING STUDIES    The following imagine studies were independently interpreted by me (emergency medicine provider):                         EMERGENCY DEPT. MEDICATIONS      ED Medication Orders (From admission, onward)      Start Ordered     Status Ordering Provider    07/17/23 1617 07/17/23 1617  iohexol  (OMNIPAQUE ) 350 MG/ML injection 100 mL  IMG once as needed        Route: Intravenous  Ordered Dose: 100 mL       Last MAR action: Imaging Agent Given MARFORI, JOSEPH B    07/17/23 1615 07/17/23 1614  acetaminophen  (TYLENOL ) tablet 1,000 mg  Once        Route: Oral  Ordered Dose: 1,000 mg       Last MAR action: Given Preciosa Bundrick A    07/17/23 1512 07/17/23 1511  morphine  injection 4 mg  Once        Route: Intravenous  Ordered Dose: 4 mg       Last MAR action: Given Anyiah Coverdale,  Mel Tadros A    07/17/23 1422 07/17/23 1421  ketorolac  (TORADOL ) injection 15 mg  Once        Route: Intravenous  Ordered Dose: 15 mg       Last MAR action: Given Rayshawn Maney A    07/17/23 1422 07/17/23 1421  sodium chloride  0.9 % bolus 1,000 mL  Once        Route: Intravenous  Ordered Dose: 1,000 mL       Last MAR action: Stopped Kamree Wiens A    07/17/23 1422 07/17/23 1421  ondansetron  (ZOFRAN ) injection 4 mg  Once        Route: Intravenous  Ordered Dose: 4 mg       Last MAR action: Given Ravina Milner A            LABORATORY RESULTS    Ordered and independently interpreted AVAILABLE laboratory tests.   Results       Procedure Component Value Units Date/Time    Urine HCG Qualitative [[id number]]  (Normal) Collected: 07/17/23 1502    Specimen: Urine, Clean Catch Updated: 07/17/23 1519     Urine HCG Qualitative Negative    Urinalysis with Reflex to Microscopic Exam and Culture [[id number]]  (Abnormal) Collected: 07/17/23 1502    Specimen: Urine, Clean Catch Updated: 07/17/23 1519     Urine Color Yellow     Urine Clarity Hazy     Urine Specific Gravity 1.030     Urine pH 6.0     Urine Leukocyte Esterase Small     Urine Nitrite Negative     Urine Protein 70= 1+     Urine Glucose Negative     Urine Ketones 10= 1+ mg/dL      Urine Urobilinogen Normal mg/dL      Urine Bilirubin Negative     Urine Blood  Negative     RBC, UA 3-5 /hpf      Urine WBC 11-25 /hpf      Urine Squamous Epithelial Cells 6-10 /hpf      Urine Mucus Present    Comprehensive Metabolic Panel [[id number]]  (Abnormal) Collected: 07/17/23 1433    Specimen: Blood, Venous Updated: 07/17/23 1508     Glucose 87 mg/dL      BUN 14 mg/dL      Creatinine 1.0 mg/dL      Sodium 863 mEq/L      Potassium 3.5 mEq/L      Chloride 99 mEq/L      CO2 24 mEq/L      Calcium  10.0 mg/dL      Anion Gap 86.9     GFR >60.0 mL/min/1.73 m2      AST (SGOT) 31 U/L      ALT 31 U/L      Alkaline Phosphatase 91 U/L      Albumin 4.3 g/dL      Protein, Total 8.6 g/dL      Globulin  4.3 g/dL      Albumin/Globulin Ratio 1.0     Bilirubin, Total 0.7 mg/dL     RNCPI-80 and Influenza (Liat) (symptomatic) [[id number]]  (Abnormal) Collected: 07/17/23 1438    Specimen: Swab from Anterior Nares Updated: 07/17/23 1508     SARS-CoV-2 (COVID-19) RNA Not Detected     Influenza A RNA Detected     Influenza B RNA Not Detected    Narrative:      A result of Detected indicates POSITIVE for the presence of viral RNA  A result of Not Detected indicates NEGATIVE for the presence of viral RNA    Test performed using the Roche cobas Liat SARS-CoV-2 & Influenza A/B assay. This is a multiplex real-time RT-PCR assay for the detection of SARS-CoV-2, influenza A, and influenza B virus RNA. Viral nucleic acids may persist in vivo, independent of viability. Detection of viral nucleic acid does not imply the presence of infectious virus, or that virus nucleic acid is the cause of clinical symptoms. Negative results do not preclude SARS-CoV-2, influenza A, and/or influenza B infection and should not be used as the sole basis for diagnosis, treatment or other patient management decisions. Invalid results may be due to inhibiting substances in the specimen and recollection should occur.     Lactic Acid [[id number]]  (Normal) Collected: 07/17/23 1438    Specimen: Blood, Venous Updated: 07/17/23 1504     Lactic Acid 0.9 mmol/L     Lipase [[id number]]  (Normal) Collected: 07/17/23 1433    Specimen: Blood, Venous Updated: 07/17/23 1502     Lipase 23 U/L     Lactic Acid [[id number]]  (Normal) Collected: 07/17/23 1433    Specimen: Blood, Venous Updated: 07/17/23 1456     Lactic Acid 1.2 mmol/L     Culture, Blood, Aerobic And Anaerobic [[id number]] Collected: 07/17/23 1438    Specimen: Blood, Venous Updated: 07/17/23 1453    CBC with Differential (Order) [[id number]]  (Abnormal) Collected: 07/17/23 1433    Specimen: Blood, Venous Updated: 07/17/23 1439    Narrative:      The following orders were created for panel order CBC with  Differential (Order).  Procedure                               Abnormality         Status                     ---------                               -----------         ------  CBC with Differential (...[[id number]]  Abnormal            Final result                 Please view results for these tests on the individual orders.    CBC with Differential (Component) [[id number]]  (Abnormal) Collected: 07/17/23 1433    Specimen: Blood, Venous Updated: 07/17/23 1439     WBC 5.49 x10 3/uL      Hemoglobin 13.1 g/dL      Hematocrit 60.8 %      Platelet Count 271 x10 3/uL      MPV 8.7 fL      RBC 4.27 x10 6/uL      MCV 91.6 fL      MCH 30.7 pg      MCHC 33.5 g/dL      RDW 13 %      nRBC % 0.0 /100 WBC      Absolute nRBC 0.00 x10 3/uL      Preliminary Absolute Neutrophil Count 3.07 x10 3/uL      Neutrophils % 55.9 %      Lymphocytes % 25.0 %      Monocytes % 18.4 %      Eosinophils % 0.0 %      Basophils % 0.5 %      Immature Granulocytes % 0.2 %      Absolute Neutrophils 3.07 x10 3/uL      Absolute Lymphocytes 1.37 x10 3/uL      Absolute Monocytes 1.01 x10 3/uL      Absolute Eosinophils 0.00 x10 3/uL      Absolute Basophils 0.03 x10 3/uL      Absolute Immature Granulocytes 0.01 x10 3/uL               CRITICAL CARE/PROCEDURES    Procedures    DIAGNOSIS      Diagnosis:  Final diagnoses:   Lower abdominal pain   Influenza A   Vaginal irritation       Disposition:  ED Disposition       ED Disposition   Discharge from ED Observation    Condition   --    Date/Time   Sun Jul 17, 2023  5:14 PM    Comment   --               Prescriptions:  Discharge Medication List as of 07/17/2023  5:14 PM        START taking these medications    Details   fluconazole  (DIFLUCAN ) 150 MG tablet Take 1 tablet (150 mg) by mouth once as needed (vaginal irritation), Starting Sun 07/17/2023, Until Mon 07/18/2023 at 2359, E-Rx      ibuprofen  (ADVIL ) 600 MG tablet Take 1 tablet (600 mg) by mouth every 6 (six) hours as needed for Pain or  Fever, Starting Sun 07/17/2023, E-Rx           CONTINUE these medications which have NOT CHANGED    Details   acetaminophen  (TYLENOL ) 325 MG tablet Take 2 tablets (650 mg) by mouth every 6 (six) hours as needed for Pain, Starting Sun 10/24/2022, E-Rx      albuterol  sulfate HFA (PROVENTIL ) 108 (90 Base) MCG/ACT inhaler Inhale 2 puffs into the lungs every 6 (six) hours as needed for Wheezing, Historical Med      amLODIPine (NORVASC) 2.5 MG tablet Take 1 tablet (2.5 mg) by mouth daily, Historical Med      butalbital -acetaminophen -caffeine  (FIORICET) 50-325-40  MG per tablet Take 1 tablet by mouth every 4 (four) hours as needed for Headaches, Starting Sat 01/16/2022, E-Rx      DIPHENHYDRAMINE HCL PO Take by mouth, Historical Med      docusate sodium  (COLACE) 100 MG capsule Take 1 capsule (100 mg total) by mouth daily, Starting Mon 04/21/2020, No Print      Elagolix Sodium 200 MG Tab Take by mouth, Historical Med      famotidine (PEPCID) 20 MG tablet Take 20 mg by mouth every 12 (twelve) hours, Historical Med      hydrocortisone  1 % ointment Apply topically 3 (three) times daily as needed (perineal care), Starting Sun 04/20/2020, No Print      ketorolac  (TORADOL ) 10 MG tablet Take 1 tablet (10 mg) by mouth every 6 (six) hours as needed for Pain, Starting Mon 12/06/2022, E-Rx      lanolin ointment Apply topically every 2 (two) hours as needed for Dry Skin (nipple discomfort), Starting Sun 04/20/2020, No Print      norgestimate-ethinyl estradiol (ORTHO-CYCLEN) 0.25-35 MG-MCG per tablet Take 1 tablet by mouth daily, Historical Med      !! ondansetron  (ZOFRAN -ODT) 4 MG disintegrating tablet Take 1 tablet (4 mg) by mouth every 6 (six) hours as needed for Nausea, Starting Thu 03/04/2022, E-Rx      !! ondansetron  (ZOFRAN -ODT) 4 MG disintegrating tablet Take 1 tablet (4 mg) by mouth every 6 (six) hours as needed for Nausea, Starting Thu 06/24/2022, E-Rx      Prenatal MV-Min-Fe Fum-FA-DHA (PRENATAL 1 PO) Take by mouth, Historical Med       promethazine  (PHENERGAN ) 25 MG tablet Take 1 tablet (25 mg) by mouth every 6 (six) hours as needed for Nausea, Starting Tue 06/14/2023, E-Rx      senna-docusate (PERICOLACE) 8.6-50 MG per tablet Take 1 tablet by mouth nightly as needed for Constipation, Starting Sun 04/20/2020, No Print      traZODone (DESYREL) 50 MG tablet Take 1 tablet (50 mg) by mouth nightly, Historical Med       !! - Potential duplicate medications found. Please discuss with provider.              This note was generated by the Epic EMR system/ Dragon speech recognition and may contain inherent errors or omissions not intended by the user. Grammatical errors, random word insertions, deletions and pronoun errors  are occasional consequences of this technology due to software limitations. Not all errors are caught or corrected. If there are questions or concerns about the content of this note or information contained within the body of this dictation they should be addressed directly with the author for clarification.           [1]   Past Surgical History:  Procedure Laterality Date    COLPOSCOPY  2018    TONSILLECTOMY  2019   [2]   Social History  Socioeconomic History    Marital status: Married   Tobacco Use    Smoking status: Former     Types: Cigars     Quit date: 2019     Years since quitting: 6.0    Smokeless tobacco: Never   Vaping Use    Vaping status: Never Used   Substance and Sexual Activity    Alcohol use: Not Currently     Comment: socially    Drug use: Never    Sexual activity: Yes     Partners: Male     Birth control/protection: None  Social Drivers of Psychologist, Prison And Probation Services Strain: Low Risk  (09/29/2022)    Overall Financial Resource Strain (CARDIA)     Difficulty of Paying Living Expenses: Not very hard   Food Insecurity: No Food Insecurity (06/14/2023)    Hunger Vital Sign     Worried About Running Out of Food in the Last Year: Never true     Ran Out of Food in the Last Year: Never true   Transportation Needs: No  Transportation Needs (06/14/2023)    PRAPARE - Therapist, Art (Medical): No     Lack of Transportation (Non-Medical): No   Physical Activity: Unknown (09/29/2022)    Exercise Vital Sign     Days of Exercise per Week: 5 days   Stress: No Stress Concern Present (09/29/2022)    [employer] of Occupational Health - Occupational Stress Questionnaire     Feeling of Stress : Not at all   Social Connections: Moderately Integrated (09/29/2022)    Social Connection and Isolation Panel [NHANES]     Frequency of Communication with Friends and Family: More than three times a week     Frequency of Social Gatherings with Friends and Family: Never     Attends Religious Services: More than 4 times per year     Active Member of [employer] or Organizations: No     Attends Banker Meetings: Never     Marital Status: Married   Catering Manager Violence: Not At Risk (06/14/2023)    Humiliation, Afraid, Rape, and Kick questionnaire     Fear of Current or Ex-Partner: No     Emotionally Abused: No     Physically Abused: No     Sexually Abused: No   Housing Stability: Low Risk  (06/14/2023)    Housing Stability Vital Sign     Unable to Pay for Housing in the Last Year: No     Number of Times Moved in the Last Year: 1     Homeless in the Last Year: No   [3] No family history on file.       Damarion Mendizabal, Glendia LABOR, [place]  07/18/23 [phone]

## 2023-07-18 LAB — CHLAMYDIA TRACHOMATIS, NEISSERIA GONORRHEA AND TRICHOMONAS VAGINALIS, PCR
Chlamydia trachomatis DNA: NEGATIVE
Neisseria gonorrhoeae DNA: NEGATIVE
Trichomonas vaginalis DNA: NEGATIVE

## 2023-07-18 LAB — LAB USE ONLY - URINE GRAY CULTURE HOLD TUBE

## 2023-07-18 LAB — VAGINAL, BACTERIAL VAGINOSIS AND TRICHOMONAS VAGINALIS, PCR
Bacterial vaginosis marker DNA: NOT DETECTED
Trichomonas vaginalis DNA: NOT DETECTED

## 2023-07-19 LAB — CULTURE, URINE: Culture Urine: NORMAL

## 2023-07-19 MED ORDER — CEPHALEXIN 500 MG PO CAPS
500.0000 mg | ORAL_CAPSULE | Freq: Two times a day (BID) | ORAL | 0 refills | Status: AC
Start: 2023-07-19 — End: 2023-07-26

## 2023-07-20 NOTE — Progress Notes (Signed)
 Subjective    Patient ID: Angela [name] is a(n) 33 y.o. female.    HPI  VIDEO VISIT:15 min    32yo P1 with known advanced stage endometriosis here complaining about ongoing pelvic pain. She has been taking Orilissa without an improvement in symptoms. She has been without a period since starting Orilissa.  She has been speaking with friends and desires a trial of OCPs and metformin. I let her know that I am unaware of metformin use benefiting endometriosis related symptoms however her BMI is 46 and she may benefit from decreased insulin resistance and weight loss.  We reviewed use/SE.  No tob use or personal or FH DVT/PE.    Patient's medications, allergies, past medical, surgical, social and family histories were reviewed and updated as appropriate.  Patient Active Problem List    Diagnosis Date Noted   . Endometriosis determined by laparoscopy 01/04/2023   . Pelvic pain 12/13/2022   . Dysmenorrhea 12/13/2022   . Abnormal uterine bleeding 03/15/2022     Outpatient Medications Prior to Visit   Medication Sig   . acetaminophen  (TYLENOL ) 325 MG tablet Take 2 tablets by mouth every 6 hours as needed.   . albuterol  108 (90 Base) MCG/ACT inhaler Inhale 2 puffs into the lungs every 6 hours as needed.  FOR WHEEZING   . amLODIPine (NORVASC) 2.5 MG tablet Take 2 tablets by mouth nightly.   . chlorthalidone 25 MG tablet Take 1 tablet by mouth daily.   . diphenhydrAMINE HCl (BENADRYL PO) Take by mouth as needed.   . docusate sodium  (COLACE) 100 MG capsule Take 1 capsule by mouth 2 times daily.   . HYDROcodone -acetaminophen  (NORCO) 5-325 MG per tablet Take 1 tablet by mouth every 6 hours as needed.  FOR PAIN   . HYDROmorphone (DILAUDID) 2 MG tablet Take 1 tablet by mouth every 4 hours as needed for pain.   . ibuprofen  (MOTRIN ) 800 MG tablet Take 1 tablet by mouth every 6 hours as needed for pain.  Take with food   . ibuprofen  (MOTRIN ) 800 MG tablet Take 1 tablet by mouth every 6 hours as needed for pain.  Take with food    . norethindrone (MICRONOR) 0.35 MG tablet Take 1 tablet by mouth daily.   . ORILISSA 200 MG TABS Take 1 tablet by mouth 2 times daily.   . oxyCODONE -acetaminophen  (PERCOCET) 5-325 MG per tablet Take 1 tablet by mouth every 6 hours as needed for pain.   . promethazine  (PHENERGAN ) 25 MG tablet Take 1 tablet by mouth every 6 hours as needed for nausea.   . promethazine  (PHENERGAN ) 25 MG tablet Take 1 tablet by mouth every 6 hours as needed for nausea.       Past Medical History:   . Endometriosis determined by laparoscopy   . Hypertension     Past Surgical History:   Procedure Laterality Date   . HAND SURGERY Left    . PR LAPAROSCOPY SURG W/BX SINGLE/MULTIPLE N/A 01/04/2023    OPERATIVE AND DIAGNOSTIC LAPAROSCOPY performed by Corean Harris, MD at [facility] OR   . TONSILLECTOMY         Review of Systems   Constitutional: Negative.    Respiratory: Negative.     Cardiovascular: Negative.    Gastrointestinal: Negative.    Genitourinary:  Positive for pelvic pain.   Psychiatric/Behavioral: Negative.         Objective    Physical Exam  Constitutional:       Appearance: Normal appearance.  She is obese.   HENT:      Head: Normocephalic and atraumatic.   Musculoskeletal:         General: Normal range of motion.   Neurological:      General: No focal deficit present.      Mental Status: She is alert and oriented to person, place, and time.   Psychiatric:         Mood and Affect: Mood normal.         Behavior: Behavior normal.         Assessment/Plan  32yo P1 with endometriosis/pelvic pain desiring mgt change  -stop Orilissa and trial of OCP/metformin  consider lupron use if no improvement

## 2023-07-22 LAB — CULTURE BLOOD AEROBIC AND ANAEROBIC: Culture Blood: NO GROWTH

## 2023-07-25 ENCOUNTER — Emergency Department
Admission: EM | Admit: 2023-07-25 | Discharge: 2023-07-25 | Disposition: A | Discharge status: Home / Self Care | Payer: No Typology Code available for payment source | Attending: Emergency Medicine | Admitting: Emergency Medicine

## 2023-07-25 ENCOUNTER — Emergency Department: Payer: No Typology Code available for payment source

## 2023-07-25 DIAGNOSIS — R051 Acute cough: Secondary | ICD-10-CM

## 2023-07-25 DIAGNOSIS — J101 Influenza due to other identified influenza virus with other respiratory manifestations: Secondary | ICD-10-CM | POA: Insufficient documentation

## 2023-07-25 DIAGNOSIS — N390 Urinary tract infection, site not specified: Secondary | ICD-10-CM | POA: Insufficient documentation

## 2023-07-25 DIAGNOSIS — J102 Influenza due to other identified influenza virus with gastrointestinal manifestations: Secondary | ICD-10-CM | POA: Insufficient documentation

## 2023-07-25 DIAGNOSIS — Z8742 Personal history of other diseases of the female genital tract: Secondary | ICD-10-CM

## 2023-07-25 DIAGNOSIS — R112 Nausea with vomiting, unspecified: Secondary | ICD-10-CM

## 2023-07-25 DIAGNOSIS — Z87891 Personal history of nicotine dependence: Secondary | ICD-10-CM | POA: Insufficient documentation

## 2023-07-25 LAB — LAB USE ONLY - CBC WITH DIFFERENTIAL
Absolute Basophils: 0.02 10*3/uL (ref 0.00–0.08)
Absolute Eosinophils: 0.07 10*3/uL (ref 0.00–0.44)
Absolute Immature Granulocytes: 0.02 10*3/uL (ref 0.00–0.07)
Absolute Lymphocytes: 3.79 10*3/uL — ABNORMAL HIGH (ref 0.42–3.22)
Absolute Monocytes: 0.39 10*3/uL (ref 0.21–0.85)
Absolute Neutrophils: 2.15 10*3/uL (ref 1.10–6.33)
Absolute nRBC: 0 10*3/uL (ref <=0.00)
Basophils %: 0.3 %
Eosinophils %: 1.1 %
Hematocrit: 37.2 % (ref 34.7–43.7)
Hemoglobin: 12.3 g/dL (ref 11.4–14.8)
Immature Granulocytes %: 0.3 %
Lymphocytes %: 58.9 %
MCH: 30.7 pg (ref 25.1–33.5)
MCHC: 33.1 g/dL (ref 31.5–35.8)
MCV: 92.8 fL (ref 78.0–96.0)
MPV: 8.9 fL (ref 8.9–12.5)
Monocytes %: 6.1 %
Neutrophils %: 33.3 %
Platelet Count: 305 10*3/uL (ref 142–346)
Preliminary Absolute Neutrophil Count: 2.15 10*3/uL (ref 1.10–6.33)
RBC: 4.01 10*6/uL (ref 3.90–5.10)
RDW: 12 % (ref 11–15)
WBC: 6.44 10*3/uL (ref 3.10–9.50)
nRBC %: 0 /100{WBCs} (ref <=0.0)

## 2023-07-25 LAB — URINALYSIS WITH REFLEX TO MICROSCOPIC EXAM - REFLEX TO CULTURE
Urine Bilirubin: NEGATIVE
Urine Blood: NEGATIVE
Urine Glucose: NEGATIVE
Urine Ketones: NEGATIVE mg/dL
Urine Nitrite: NEGATIVE
Urine Specific Gravity: 1.033 (ref 1.001–1.035)
Urine Urobilinogen: NORMAL mg/dL (ref 0.2–2.0)
Urine pH: 6.5 (ref 5.0–8.0)

## 2023-07-25 LAB — COMPREHENSIVE METABOLIC PANEL
ALT: 25 U/L (ref <=55)
AST (SGOT): 25 U/L (ref <=41)
Albumin/Globulin Ratio: 1.1 (ref 0.9–2.2)
Albumin: 4.2 g/dL (ref 3.5–5.0)
Alkaline Phosphatase: 73 U/L (ref 37–117)
Anion Gap: 8 (ref 5.0–15.0)
BUN: 17 mg/dL (ref 7–21)
Bilirubin, Total: 0.3 mg/dL (ref 0.2–1.2)
CO2: 26 meq/L (ref 17–29)
Calcium: 9.5 mg/dL (ref 8.5–10.5)
Chloride: 101 meq/L (ref 99–111)
Creatinine: 1.1 mg/dL — ABNORMAL HIGH (ref 0.4–1.0)
GFR: >60 mL/min/{1.73_m2} (ref >=60.0)
Globulin: 4 g/dL — ABNORMAL HIGH (ref 2.0–3.6)
Glucose: 82 mg/dL (ref 70–100)
Potassium: 3.8 meq/L (ref 3.5–5.3)
Protein, Total: 8.2 g/dL (ref 6.0–8.3)
Sodium: 135 meq/L (ref 135–145)

## 2023-07-25 LAB — LAB USE ONLY - GOLD SST HOLD TUBE

## 2023-07-25 LAB — SERUM HCG, QUALITATIVE: hCG Qualitative: NEGATIVE

## 2023-07-25 LAB — LIPASE: Lipase: 37 U/L (ref 8–78)

## 2023-07-25 LAB — LAB USE ONLY - URINE GRAY CULTURE HOLD TUBE

## 2023-07-25 LAB — LAB USE ONLY - LAVENDER - EDTA HOLD TUBE

## 2023-07-25 MED ORDER — PROCHLORPERAZINE EDISYLATE 10 MG/2ML IJ SOLN
10.0000 mg | Freq: Once | INTRAMUSCULAR | Status: AC
Start: 2023-07-25 — End: 2023-07-25
  Administered 2023-07-25: 10 mg via INTRAVENOUS
  Filled 2023-07-25: qty 2

## 2023-07-25 MED ORDER — ONDANSETRON HCL 4 MG/2ML IJ SOLN
4.0000 mg | Freq: Once | INTRAMUSCULAR | Status: AC
Start: 2023-07-25 — End: 2023-07-25
  Administered 2023-07-25: 4 mg via INTRAVENOUS
  Filled 2023-07-25: qty 2

## 2023-07-25 MED ORDER — KETOROLAC TROMETHAMINE 30 MG/ML IJ SOLN
30.0000 mg | Freq: Once | INTRAMUSCULAR | Status: AC
Start: 2023-07-25 — End: 2023-07-25
  Administered 2023-07-25: 30 mg via INTRAVENOUS
  Filled 2023-07-25: qty 1

## 2023-07-25 NOTE — ED Notes (Signed)
 EMERGENCY DEPARTMENT ATTENDING PHYSICIAN NOTE     I performed the substantive portion of the MDM. For the problems addressed, I personally developed, reviewed, and/or approved the plan and assessment as documented by the APP.  Patient was seen by APP, see their note for further documentation of history and exam.    BRIEF HISTORY OF PRESENT ILLNESS AND ADDITIONAL EXAM FINDINGS     Chief Complaint: Emesis       History per APP    33 y.o. female presents with n/v. Occurred a week ago. Also diagnosed with the flu    Triage Vitals:  ED Triage Vitals [07/25/23 1136]   Encounter Vitals Group      BP 110/69      Systolic BP Percentile       Diastolic BP Percentile       Heart Rate 94      Resp Rate 18      Temp 97.6 F (36.4 C)      Temp src Oral      SpO2 99 %      Weight 116 kg      Height       Head Circumference       Peak Flow       Pain Score       Pain Loc       Pain Education       Exclude from Growth Chart             MEDICAL DECISION MAKING   Just had CT a week ago and unremarkable. Labs unremarkable as well. Still with nausea but no vomiting at this time. No CP ro SOB. Persistent cough. APP at work recommended she come in for an XR to make sure she does not have PNA given a cough...        Vital Signs: Reviewed the patient's vital signs.   Nursing Notes: Reviewed and utilized available nursing notes.  Medical Records Reviewed: Reviewed available past medical records.    CARDIAC STUDIES    The following cardiac studies were independently interpreted by me the Emergency Medicine Physician.  For full cardiac study results please see chart. I discussed testing results with the APP.              EMERGENCY IMAGING STUDIES    The following imagine studies were independently interpreted by me (emergency medicine physician). I discussed testing results with the APP.                     RADIOLOGY IMAGING STUDIES      XR Chest  AP Portable    (Results Pending)       EMERGENCY DEPT. MEDICATIONS      ED Medication Orders  (From admission, onward)      Start Ordered     Status Ordering Provider    07/25/23 1159 07/25/23 1158  ketorolac  (TORADOL ) injection 30 mg  Once        Route: Intravenous  Ordered Dose: 30 mg       Last MAR action: Given TANNESEN, JENNILYN P    07/25/23 1159 07/25/23 1158  ondansetron  (ZOFRAN ) injection 4 mg  Once        Route: Intravenous  Ordered Dose: 4 mg       Last MAR action: Given TANNESEN, JENNILYN P            LABORATORY RESULTS    Ordered and independently interpreted AVAILABLE laboratory tests.  Results       Procedure Component Value Units Date/Time    Comprehensive Metabolic Panel [[id number]]  (Abnormal) Collected: 07/25/23 1149    Specimen: Blood, Venous Updated: 07/25/23 1219     Glucose 82 mg/dL      BUN 17 mg/dL      Creatinine 1.1 mg/dL      Sodium 864 mEq/L      Potassium 3.8 mEq/L      Chloride 101 mEq/L      CO2 26 mEq/L      Calcium  9.5 mg/dL      Anion Gap 8.0     GFR >60.0 mL/min/1.73 m2      AST (SGOT) 25 U/L      ALT 25 U/L      Alkaline Phosphatase 73 U/L      Albumin 4.2 g/dL      Protein, Total 8.2 g/dL      Globulin 4.0 g/dL      Albumin/Globulin Ratio 1.1     Bilirubin, Total 0.3 mg/dL     Lipase [[id number]]  (Normal) Collected: 07/25/23 1149    Specimen: Blood, Venous Updated: 07/25/23 1219     Lipase 37 U/L     Serum Beta HCG Qualitative [[id number]]  (Normal) Collected: 07/25/23 1149    Specimen: Blood, Venous Updated: 07/25/23 1211     hCG Qualitative Negative    CBC with Differential (Order) [[id number]]  (Abnormal) Collected: 07/25/23 1149    Specimen: Blood, Venous Updated: 07/25/23 1207    Narrative:      The following orders were created for panel order CBC with Differential (Order).  Procedure                               Abnormality         Status                     ---------                               -----------         ------                     CBC with Differential (...[[id number]]  Abnormal            Final result                 Please view results for these  tests on the individual orders.    CBC with Differential (Component) [[id number]]  (Abnormal) Collected: 07/25/23 1149    Specimen: Blood, Venous Updated: 07/25/23 1207     WBC 6.44 x10 3/uL      Hemoglobin 12.3 g/dL      Hematocrit 62.7 %      Platelet Count 305 x10 3/uL      MPV 8.9 fL      RBC 4.01 x10 6/uL      MCV 92.8 fL      MCH 30.7 pg      MCHC 33.1 g/dL      RDW 12 %      nRBC % 0.0 /100 WBC      Absolute nRBC 0.00 x10 3/uL      Preliminary Absolute Neutrophil Count 2.15 x10 3/uL      Neutrophils % 33.3 %      Lymphocytes %  58.9 %      Monocytes % 6.1 %      Eosinophils % 1.1 %      Basophils % 0.3 %      Immature Granulocytes % 0.3 %      Absolute Neutrophils 2.15 x10 3/uL      Absolute Lymphocytes 3.79 x10 3/uL      Absolute Monocytes 0.39 x10 3/uL      Absolute Eosinophils 0.07 x10 3/uL      Absolute Basophils 0.02 x10 3/uL      Absolute Immature Granulocytes 0.02 x10 3/uL     Lavender - EDTA Hold Tube [[id number]] Collected: 07/25/23 1149    Specimen: Blood, Venous Updated: 07/25/23 1152    Rainbow Draw [[id number]] Collected: 07/25/23 1149    Specimen: Blood, Venous Updated: 07/25/23 1151    Narrative:      The following orders were created for panel order Rainbow Draw.  Procedure                               Abnormality         Status                     ---------                               -----------         ------                     Viviane SST Hold [id number]]                              In process                 Lavender - EDTA Hold Tube[[id number]]                       In process                   Please view results for these tests on the individual orders.    Gold SST Hold Tube [[id number]] Collected: 07/25/23 1149    Specimen: Blood, Venous Updated: 07/25/23 1151              CRITICAL CARE/PROCEDURES        DIAGNOSIS    I (ED Physician) discussed final disposition with the APP    Diagnosis:  Final diagnoses:   None       Disposition:  ED Disposition       None             Prescriptions:  Patient's Medications   New Prescriptions    No medications on file   Previous Medications    ACETAMINOPHEN  (TYLENOL ) 325 MG TABLET    Take 2 tablets (650 mg) by mouth every 6 (six) hours as needed for Pain    ALBUTEROL  SULFATE HFA (PROVENTIL ) 108 (90 BASE) MCG/ACT INHALER    Inhale 2 puffs into the lungs every 6 (six) hours as needed for Wheezing    AMLODIPINE (NORVASC) 2.5 MG TABLET    Take 1 tablet (2.5 mg) by mouth daily    BUTALBITAL -ACETAMINOPHEN -CAFFEINE  (FIORICET) 50-325-40 MG PER TABLET    Take 1 tablet by mouth every 4 (four) hours as needed  for Headaches    CEPHALEXIN  (KEFLEX ) 500 MG CAPSULE    Take 1 capsule (500 mg) by mouth every 12 (twelve) hours for 7 days    DIPHENHYDRAMINE HCL PO    Take by mouth    DOCUSATE SODIUM  (COLACE) 100 MG CAPSULE    Take 1 capsule (100 mg total) by mouth daily    ELAGOLIX SODIUM 200 MG TAB    Take by mouth    FAMOTIDINE (PEPCID) 20 MG TABLET    Take 20 mg by mouth every 12 (twelve) hours    HYDROCORTISONE  1 % OINTMENT    Apply topically 3 (three) times daily as needed (perineal care)    IBUPROFEN  (ADVIL ) 600 MG TABLET    Take 1 tablet (600 mg) by mouth every 6 (six) hours as needed for Pain or Fever    KETOROLAC  (TORADOL ) 10 MG TABLET    Take 1 tablet (10 mg) by mouth every 6 (six) hours as needed for Pain    LANOLIN OINTMENT    Apply topically every 2 (two) hours as needed for Dry Skin (nipple discomfort)    NORGESTIMATE-ETHINYL ESTRADIOL (ORTHO-CYCLEN) 0.25-35 MG-MCG PER TABLET    Take 1 tablet by mouth daily    ONDANSETRON  (ZOFRAN -ODT) 4 MG DISINTEGRATING TABLET    Take 1 tablet (4 mg) by mouth every 6 (six) hours as needed for Nausea    ONDANSETRON  (ZOFRAN -ODT) 4 MG DISINTEGRATING TABLET    Take 1 tablet (4 mg) by mouth every 6 (six) hours as needed for Nausea    PRENATAL MV-MIN-FE FUM-FA-DHA (PRENATAL 1 PO)    Take by mouth    PROMETHAZINE  (PHENERGAN ) 25 MG TABLET    Take 1 tablet (25 mg) by mouth every 6 (six) hours as needed for Nausea     SENNA-DOCUSATE (PERICOLACE) 8.6-50 MG PER TABLET    Take 1 tablet by mouth nightly as needed for Constipation    TRAZODONE (DESYREL) 50 MG TABLET    Take 1 tablet (50 mg) by mouth nightly   Modified Medications    No medications on file   Discontinued Medications    No medications on file          Garon Eden, MD  07/25/23 1450

## 2023-07-25 NOTE — ED Notes (Signed)
First encounter with patient. Pt reports nausea, vomiting and decreased PO intake starting approx 1/19-1/20. Pt was seen here at that time, diagnosed with Influenza A and UTI. Pt reports she is still currently taking Keflex. Well appearing. Skin warm, dry.

## 2023-07-25 NOTE — Discharge Instructions (Signed)
 Continue your prescribed medication as prescribed.  Rest, keep hydrated. Do bland diet, small amounts until better.  Follow-up with your primary care in 2-3 days if not improving for re-evaluation.  Follow-up with your OBGYN as indicated by them, sooner with worsening symptoms.  Return to the ER with any worsening or concerns.

## 2023-07-25 NOTE — ED Provider Notes (Signed)
 EMERGENCY DEPARTMENT NOTE     Patient initially seen and examined at   ED PHYSICIAN ASSIGNED       Date/Time Event User Comments    07/25/23 1233 Physician Assigned GARON ROLANDO Garon Rolando, MD assigned as Attending           ED [facility] (APP) ASSIGNED       Date/Time Event User Comments    07/25/23 1140 PA/NP Provider Assigned BETTYLOU, Kaityln Kallstrom P. Davon Folta P, FNP assigned as Nurse Practitioner            HISTORY OF PRESENT ILLNESS       Chief Complaint: Emesis       33 y.o. female with past medical history as below (hx of asthma, endometriosis, PCOS) presents for continued low abdominal pain and N/V since 07/16/2023, seen in ER 07/17/2023 and dx with flu. Reports has been taking promethazine  with little effect. No vomit today d/t no PO today.  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  Also currently on cephalexin  for UTI, no urinary sx at this time.   Denies chest pain, SOB, diarrhea.  Reports hx of endometriosis and does get flares, reports pain feels similar to endometriosis; is followed by OBGYN, is taking a specific medication for endometriosis/has close follow-up and plan.   8/10 pain, unsure what makes worse, reports medications in past helpful.     Independent Historian (other than patient): No  Additional History Provided by Independent Historian:      MEDICAL HISTORY     Past Medical History:  Past Medical History:   Diagnosis Date    Asthma     Endometriosis     Headache     PCOS (polycystic ovarian syndrome)        Past Surgical History:  Past Surgical History[1]    Social History:  Social History[2]    Family History:  Family History[3]    Outpatient Medication:  Discharge Medication List as of 07/25/2023  1:29 PM        CONTINUE these medications which have NOT CHANGED    Details   acetaminophen  (TYLENOL ) 325 MG tablet Take 2 tablets (650 mg) by mouth every 6 (six) hours as needed for  Pain, Starting Sun 10/24/2022, E-Rx      albuterol  sulfate HFA (PROVENTIL ) 108 (90 Base) MCG/ACT inhaler Inhale 2 puffs into the lungs every 6 (six) hours as needed for Wheezing, Historical Med      amLODIPine (NORVASC) 2.5 MG tablet Take 1 tablet (2.5 mg) by mouth daily, Historical Med      butalbital -acetaminophen -caffeine  (FIORICET) 50-325-40 MG per tablet Take 1 tablet by mouth every 4 (four) hours as needed for Headaches, Starting Sat 01/16/2022, E-Rx      cephALEXin  (KEFLEX ) 500 MG capsule Take 1 capsule (500 mg) by mouth every 12 (twelve) hours for 7 days, Starting Tue 07/19/2023, Until Tue 07/26/2023, Normal      DIPHENHYDRAMINE HCL PO Take by mouth, Historical Med      docusate sodium  (COLACE) 100 MG capsule Take 1 capsule (100 mg total) by mouth daily, Starting Mon 04/21/2020, No Print      Elagolix Sodium 200 MG Tab Take by mouth, Historical Med      famotidine (PEPCID) 20 MG tablet Take 20 mg by mouth every 12 (twelve) hours, Historical Med      hydrocortisone  1 % ointment Apply topically 3 (three) times daily  as needed (perineal care), Starting Sun 04/20/2020, No Print      ibuprofen  (ADVIL ) 600 MG tablet Take 1 tablet (600 mg) by mouth every 6 (six) hours as needed for Pain or Fever, Starting Sun 07/17/2023, E-Rx      ketorolac  (TORADOL ) 10 MG tablet Take 1 tablet (10 mg) by mouth every 6 (six) hours as needed for Pain, Starting Mon 12/06/2022, E-Rx      lanolin ointment Apply topically every 2 (two) hours as needed for Dry Skin (nipple discomfort), Starting Sun 04/20/2020, No Print      norgestimate-ethinyl estradiol (ORTHO-CYCLEN) 0.25-35 MG-MCG per tablet Take 1 tablet by mouth daily, Historical Med      !! ondansetron  (ZOFRAN -ODT) 4 MG disintegrating tablet Take 1 tablet (4 mg) by mouth every 6 (six) hours as needed for Nausea, Starting Thu 03/04/2022, E-Rx      !! ondansetron  (ZOFRAN -ODT) 4 MG disintegrating tablet Take 1 tablet (4 mg) by mouth every 6 (six) hours as needed for Nausea, Starting Thu  06/24/2022, E-Rx      Prenatal MV-Min-Fe Fum-FA-DHA (PRENATAL 1 PO) Take by mouth, Historical Med      promethazine  (PHENERGAN ) 25 MG tablet Take 1 tablet (25 mg) by mouth every 6 (six) hours as needed for Nausea, Starting Tue 06/14/2023, E-Rx      senna-docusate (PERICOLACE) 8.6-50 MG per tablet Take 1 tablet by mouth nightly as needed for Constipation, Starting Sun 04/20/2020, No Print      traZODone (DESYREL) 50 MG tablet Take 1 tablet (50 mg) by mouth nightly, Historical Med       !! - Potential duplicate medications found. Please discuss with provider.            REVIEW OF SYSTEMS   Review of Systems See History of Present Illness      PHYSICAL EXAM     ED Triage Vitals   Encounter Vitals Group      BP 07/25/23 1136 110/69      Systolic BP Percentile --       Diastolic BP Percentile --       Heart Rate 07/25/23 1136 94      Resp Rate 07/25/23 1136 18      Temp 07/25/23 1136 97.6 F (36.4 C)      Temp src 07/25/23 1136 Oral      SpO2 07/25/23 1136 99 %      Weight 07/25/23 1136 116 kg      Height --       Head Circumference --       Peak Flow --       Pain Score 07/25/23 1304 8      Pain Loc --       Pain Education --       Exclude from Growth Chart --      Physical Exam  Vitals and nursing note reviewed.   Constitutional:       General: She is not in acute distress.     Appearance: Normal appearance. She is not ill-appearing, toxic-appearing or diaphoretic.   HENT:      Head: Normocephalic and atraumatic.      Right Ear: External ear normal.      Left Ear: External ear normal.      Nose: Nose normal.      Mouth/Throat:      Mouth: Mucous membranes are moist.      Pharynx: Oropharynx is clear.   Eyes:      General: No  scleral icterus.        Right eye: No discharge.         Left eye: No discharge.      Conjunctiva/sclera: Conjunctivae normal.   Cardiovascular:      Rate and Rhythm: Normal rate and regular rhythm.      Pulses: Normal pulses.      Heart sounds: Normal heart sounds.   Pulmonary:      Effort:  Pulmonary effort is normal.      Breath sounds: Normal breath sounds.   Abdominal:      General: There is no distension.      Palpations: Abdomen is soft.      Tenderness: There is no guarding.      Comments: No increase in tenderness with palpation   Musculoskeletal:         General: Normal range of motion.      Cervical back: Normal range of motion and neck supple. No rigidity.      Right lower leg: No edema.      Left lower leg: No edema.   Skin:     General: Skin is warm and dry.      Capillary Refill: Capillary refill takes less than 2 seconds.      Coloration: Skin is not jaundiced or pale.   Neurological:      General: No focal deficit present.      Mental Status: She is alert and oriented to person, place, and time.      Gait: Gait normal.   Psychiatric:         Mood and Affect: Mood normal.         Behavior: Behavior normal.              MEDICAL DECISION MAKING     PRIMARY PROBLEM LIST      Acute illness/injury DIAGNOSIS:  Influenza A, cough, NV  Chronic Illness Impacting Care of the above problem: Asthma and Other (explain)   Increase the risk of disease progression      DISCUSSION      Very pleasant, in NAD.  VS reviewed by me; oxygen saturation read/interpreted by me/ED provider WNL 98% RA with no needed intervention.  Discussed results/imaging from last visit, patient reports no change to pain, offered imaging - shared decision making not to image at this time.  Medicate for discomfort, nausea.  CBC to evaluate for infectious processes, anemia; CMP to evaluate liver, kidneys, electrolytes; lipase; UA to evaluate for cystitis; hCG.  CXR to evaluate for PNA, edema, effusion, CHF, acute etiology.   Ddx also include but not limited to bronchitis, other viral illness, asthma/reactive airway, allergies, post-tussive syndrome; endometriosis flare, food/substance related.   Discussed with case attending, Dr. Garon, who is agreeable to the plan of care, treatment and disposition.     Labs reviewed by me and  discussed with patient, creatinine 1.1 (last for 1.0), otherwise CBC/CMP/lipase unremarkable at this time, neg hcg, UA with trace leuks/0-5 WBC/0-5 squams; management includes already on antibiotic for UTI, management sx, PO challenge, CXR.     Xray reviewed by me and discussed with patient, CXR with no obvious PNA/acute etiology; management includes monitor sx at home, meds as already prescribed, follow-up with PCP and OBGYN.  See ED course below.     Social Determinants of Health Considerations: Yes (explain)  R/t hx of smoking    Pt indicates understanding and agrees with [facility] plan.  Stable at discharge, ambulation steady on own.  If patient is being hospitalized is severe sepsis or septic shock suspected?: N/A          External Records Reviewed?: [place]  Prescription Drug Monitor Reviewed, and no concerning prescriptions noted.        Additional Notes                ED Course as of 07/25/23 1917   Mon Jul 25, 2023   1254 Continues to have nausea, will give additional medication.  [JT]   1324 On re-evaluation in NAD, improvement with medication, able to tolerate PO.  Discussed results, possible causes, typical time frame/sx can return to work post flu, had OBGYN plan for endometriosis. Reports ready for [place].  Discussed rest, hydration, follow-up with PCP/OBGYN, when to return to the ER. [JT]      ED Course User Index  [JT] Kiyra Slaubaugh P, FNP         Vital Signs: Reviewed the patient's vital signs.   Nursing Notes: Reviewed and utilized available nursing notes.  Medical Records Reviewed: Reviewed available past medical records.  Counseling: The emergency provider has spoken with the patient and discussed today's findings, in addition to providing specific details for the plan of care.  Questions are answered and there is agreement with the plan.          MIPS DOCUMENTATION              CARDIAC STUDIES    The following cardiac studies were independently interpreted by me the Emergency Medicine Provider.   For full cardiac study results please see chart.        EMERGENCY IMAGING STUDIES    The following imagine studies were independently interpreted by me (emergency medicine provider):    Chest Xray Interpreted by me (ED provider) SIDE : N/A  Comparison: None available  RESULT: No infiltrate. No pneumothorax. No large hemothorax. No CHF.  IMPRESSION: No acute abnormality        RADIOLOGY IMAGING STUDIES      XR Chest  AP Portable   Final Result    No active disease       Barnie Lamp, MD   07/25/2023 12:39 PM          EMERGENCY DEPT. MEDICATIONS      ED Medication Orders (From admission, onward)      Start Ordered     Status Ordering Provider    07/25/23 1255 07/25/23 1254  prochlorperazine  (COMPAZINE ) injection 10 mg  Once        Route: Intravenous  Ordered Dose: 10 mg       Last MAR action: Given Joeangel Jeanpaul P    07/25/23 1159 07/25/23 1158  ketorolac  (TORADOL ) injection 30 mg  Once        Route: Intravenous  Ordered Dose: 30 mg       Last MAR action: Given Kedrick Mcnamee P    07/25/23 1159 07/25/23 1158  ondansetron  (ZOFRAN ) injection 4 mg  Once        Route: Intravenous  Ordered Dose: 4 mg       Last MAR action: Given Saad Buhl P            LABORATORY RESULTS    Ordered and independently interpreted AVAILABLE laboratory tests.   Results       Procedure Component Value Units Date/Time    Urinalysis with Reflex to Microscopic Exam and Culture [[id number]]  (Abnormal) Collected: 07/25/23 1305    Specimen: Urine, Clean Catch Updated: 07/25/23  1314     Urine Color Yellow     Urine Clarity Clear     Urine Specific Gravity 1.033     Urine pH 6.5     Urine Leukocyte Esterase Trace     Urine Nitrite Negative     Urine Protein 30= 1+     Urine Glucose Negative     Urine Ketones Negative mg/dL      Urine Urobilinogen Normal mg/dL      Urine Bilirubin Negative     Urine Blood Negative     RBC, UA 0-2 /hpf      Urine WBC 0-5 /hpf      Urine Squamous Epithelial Cells 0-5 /hpf      Urine Mucus Present     Urine [facility] Culture Hold Tube [[id number]] Collected: 07/25/23 1305    Specimen: Urine, Clean Catch Updated: 07/25/23 1309    Comprehensive Metabolic Panel [[id number]]  (Abnormal) Collected: 07/25/23 1149    Specimen: Blood, Venous Updated: 07/25/23 1219     Glucose 82 mg/dL      BUN 17 mg/dL      Creatinine 1.1 mg/dL      Sodium 864 mEq/L      Potassium 3.8 mEq/L      Chloride 101 mEq/L      CO2 26 mEq/L      Calcium  9.5 mg/dL      Anion Gap 8.0     GFR >60.0 mL/min/1.73 m2      AST (SGOT) 25 U/L      ALT 25 U/L      Alkaline Phosphatase 73 U/L      Albumin 4.2 g/dL      Protein, Total 8.2 g/dL      Globulin 4.0 g/dL      Albumin/Globulin Ratio 1.1     Bilirubin, Total 0.3 mg/dL     Lipase [[id number]]  (Normal) Collected: 07/25/23 1149    Specimen: Blood, Venous Updated: 07/25/23 1219     Lipase 37 U/L     Serum Beta HCG Qualitative [[id number]]  (Normal) Collected: 07/25/23 1149    Specimen: Blood, Venous Updated: 07/25/23 1211     hCG Qualitative Negative    CBC with Differential (Order) [[id number]]  (Abnormal) Collected: 07/25/23 1149    Specimen: Blood, Venous Updated: 07/25/23 1207    Narrative:      The following orders were created for panel order CBC with Differential (Order).  Procedure                               Abnormality         Status                     ---------                               -----------         ------                     CBC with Differential (...[[id number]]  Abnormal            Final result                 Please view results for these tests on the individual orders.    CBC with Differential (Component) [[id number]]  (Abnormal) Collected: 07/25/23 1149  Specimen: Blood, Venous Updated: 07/25/23 1207     WBC 6.44 x10 3/uL      Hemoglobin 12.3 g/dL      Hematocrit 62.7 %      Platelet Count 305 x10 3/uL      MPV 8.9 fL      RBC 4.01 x10 6/uL      MCV 92.8 fL      MCH 30.7 pg      MCHC 33.1 g/dL      RDW 12 %      nRBC % 0.0 /100 WBC      Absolute nRBC 0.00 x10 3/uL       Preliminary Absolute Neutrophil Count 2.15 x10 3/uL      Neutrophils % 33.3 %      Lymphocytes % 58.9 %      Monocytes % 6.1 %      Eosinophils % 1.1 %      Basophils % 0.3 %      Immature Granulocytes % 0.3 %      Absolute Neutrophils 2.15 x10 3/uL      Absolute Lymphocytes 3.79 x10 3/uL      Absolute Monocytes 0.39 x10 3/uL      Absolute Eosinophils 0.07 x10 3/uL      Absolute Basophils 0.02 x10 3/uL      Absolute Immature Granulocytes 0.02 x10 3/uL     Lavender - EDTA Hold Tube [[id number]] Collected: 07/25/23 1149    Specimen: Blood, Venous Updated: 07/25/23 1152    Rainbow Draw [[id number]] Collected: 07/25/23 1149    Specimen: Blood, Venous Updated: 07/25/23 1151    Narrative:      The following orders were created for panel order Rainbow Draw.  Procedure                               Abnormality         Status                     ---------                               -----------         ------                     Viviane SST Hold [id number]]                              In process                 Lavender - EDTA Hold [id number]]                       In process                   Please view results for these tests on the individual orders.    Gold SST Hold Tube [[id number]] Collected: 07/25/23 1149    Specimen: Blood, Venous Updated: 07/25/23 1151              CRITICAL CARE/PROCEDURES    Procedures        DIAGNOSIS      Diagnosis:  Final diagnoses:   Influenza A   Acute cough   Nausea and vomiting, unspecified vomiting type  History of endometriosis       Disposition:  ED Disposition       ED Disposition   Discharge    Condition   --    Date/Time   Mon Jul 25, 2023  1:42 PM    Comment   Pt discharge home walking with steady gait, not in any distress, Aox4, pain is controlled. Discharge  paper and follow up reviewed with pt, all questions answered and verbalized understanding.        Angela [name] discharge to home/self care.     Condition at disposition: Stable                  Prescriptions:  Discharge Medication List as of 07/25/2023  1:29 PM        CONTINUE these medications which have NOT CHANGED    Details   acetaminophen  (TYLENOL ) 325 MG tablet Take 2 tablets (650 mg) by mouth every 6 (six) hours as needed for Pain, Starting Sun 10/24/2022, E-Rx      albuterol  sulfate HFA (PROVENTIL ) 108 (90 Base) MCG/ACT inhaler Inhale 2 puffs into the lungs every 6 (six) hours as needed for Wheezing, Historical Med      amLODIPine (NORVASC) 2.5 MG tablet Take 1 tablet (2.5 mg) by mouth daily, Historical Med      butalbital -acetaminophen -caffeine  (FIORICET) 50-325-40 MG per tablet Take 1 tablet by mouth every 4 (four) hours as needed for Headaches, Starting Sat 01/16/2022, E-Rx      cephALEXin  (KEFLEX ) 500 MG capsule Take 1 capsule (500 mg) by mouth every 12 (twelve) hours for 7 days, Starting Tue 07/19/2023, Until Tue 07/26/2023, Normal      DIPHENHYDRAMINE HCL PO Take by mouth, Historical Med      docusate sodium  (COLACE) 100 MG capsule Take 1 capsule (100 mg total) by mouth daily, Starting Mon 04/21/2020, No Print      Elagolix Sodium 200 MG Tab Take by mouth, Historical Med      famotidine (PEPCID) 20 MG tablet Take 20 mg by mouth every 12 (twelve) hours, Historical Med      hydrocortisone  1 % ointment Apply topically 3 (three) times daily as needed (perineal care), Starting Sun 04/20/2020, No Print      ibuprofen  (ADVIL ) 600 MG tablet Take 1 tablet (600 mg) by mouth every 6 (six) hours as needed for Pain or Fever, Starting Sun 07/17/2023, E-Rx      ketorolac  (TORADOL ) 10 MG tablet Take 1 tablet (10 mg) by mouth every 6 (six) hours as needed for Pain, Starting Mon 12/06/2022, E-Rx      lanolin ointment Apply topically every 2 (two) hours as needed for Dry Skin (nipple discomfort), Starting Sun 04/20/2020, No Print      norgestimate-ethinyl estradiol (ORTHO-CYCLEN) 0.25-35 MG-MCG per tablet Take 1 tablet by mouth daily, Historical Med      !! ondansetron  (ZOFRAN -ODT) 4 MG disintegrating tablet  Take 1 tablet (4 mg) by mouth every 6 (six) hours as needed for Nausea, Starting Thu 03/04/2022, E-Rx      !! ondansetron  (ZOFRAN -ODT) 4 MG disintegrating tablet Take 1 tablet (4 mg) by mouth every 6 (six) hours as needed for Nausea, Starting Thu 06/24/2022, E-Rx      Prenatal MV-Min-Fe Fum-FA-DHA (PRENATAL 1 PO) Take by mouth, Historical Med      promethazine  (PHENERGAN ) 25 MG tablet Take 1 tablet (25 mg) by mouth every 6 (six) hours as needed for Nausea, Starting Tue 06/14/2023, E-Rx      senna-docusate (  PERICOLACE) 8.6-50 MG per tablet Take 1 tablet by mouth nightly as needed for Constipation, Starting Sun 04/20/2020, No Print      traZODone (DESYREL) 50 MG tablet Take 1 tablet (50 mg) by mouth nightly, Historical Med       !! - Potential duplicate medications found. Please discuss with provider.              This note was generated by the Epic EMR system/ Dragon speech recognition and may contain inherent errors or omissions not intended by the user. Grammatical errors, random word insertions, deletions and pronoun errors  are occasional consequences of this technology due to software limitations. Not all errors are caught or corrected. If there are questions or concerns about the content of this note or information contained within the body of this dictation they should be addressed directly with the author for clarification.             [1]   Past Surgical History:  Procedure Laterality Date    COLPOSCOPY  2018    TONSILLECTOMY  2019   [2]   Social History  Socioeconomic History    Marital status: Married   Tobacco Use    Smoking status: Former     Types: Cigars     Quit date: 2019     Years since quitting: 6.0    Smokeless tobacco: Never   Vaping Use    Vaping status: Never Used   Substance and Sexual Activity    Alcohol use: Not Currently     Comment: socially    Drug use: Never    Sexual activity: Yes     Partners: Male     Birth control/protection: None     Social Drivers of Health     Financial Resource  Strain: Low Risk  (09/29/2022)    Overall Financial Resource Strain (CARDIA)     Difficulty of Paying Living Expenses: Not very hard   Food Insecurity: No Food Insecurity (06/14/2023)    Hunger Vital Sign     Worried About Running Out of Food in the Last Year: Never true     Ran Out of Food in the Last Year: Never true   Transportation Needs: No Transportation Needs (06/14/2023)    PRAPARE - Therapist, Art (Medical): No     Lack of Transportation (Non-Medical): No   Physical Activity: Unknown (09/29/2022)    Exercise Vital Sign     Days of Exercise per Week: 5 days   Stress: No Stress Concern Present (09/29/2022)    [employer] of Occupational Health - Occupational Stress Questionnaire     Feeling of Stress : Not at all   Social Connections: Moderately Integrated (09/29/2022)    Social Connection and Isolation Panel [NHANES]     Frequency of Communication with Friends and Family: More than three times a week     Frequency of Social Gatherings with Friends and Family: Never     Attends Religious Services: More than 4 times per year     Active Member of [employer] or Organizations: No     Attends Banker Meetings: Never     Marital Status: Married   Catering Manager Violence: Not At Risk (06/14/2023)    Humiliation, Afraid, Rape, and Kick questionnaire     Fear of Current or Ex-Partner: No     Emotionally Abused: No     Physically Abused: No     Sexually Abused: No  Housing Stability: Low Risk  (06/14/2023)    Housing Stability Vital Sign     Unable to Pay for Housing in the Last Year: No     Number of Times Moved in the Last Year: 1     Homeless in the Last Year: No   [3] No family history on file.       Shakiara Lukic P, [place]  07/25/23 [phone]

## 2023-07-25 NOTE — ED Triage Notes (Signed)
C/o N/V everyday x2 episode since 1/18 after being diagnosed w/ flu on 1/19.

## 2023-07-25 NOTE — EDIE (Signed)
 [facility] NOTIFICATION 07/25/2023 11:24 Vickerman, Rhaelyn S DOB: Jun 18, 1991 MRN: [id number]    Criteria Met      5 ED Visits in 12 Months    Security and Safety  No Security Events were found.  ED Care Guidelines  There are currently no ED Care Guidelines for this patient. Please check your facility's medical records system.        Prescription Drug Data  No Prescription Drug Data was found.    E.D. Visit Count (12 mo.)  Facility Visits   [place] Emergency [facility]: [facility] 8   Total 8   Note: Visits indicate total known visits.     Recent Emergency Department Visit Summary  Date Facility [facility] Type Diagnoses or Chief Complaint    Jul 25, 2023  [facility] Emergency Room: [facility] [place]  [place]  Emergency      light headed, can't keep food down      Jul 17, 2023  [place] Emergency [facility]: [facility].  [place]  Emergency      Other specified noninflammatory disorders of vagina      Influenza due to other identified influenza virus with other respiratory manifestations      Lower abdominal pain, unspecified      Abdominal Pain      Jun 17, 2023  [place] Emergency Room: [facility].  [place]  Emergency      Pelvic and perineal pain      Other chronic pain      Abdominal pain      Jun 14, 2023  [place] Emergency Room: [facility].  [place]  Emergency      Lower abdominal pain, unspecified      Abdominal Pain      Back Pain      Abdominal pain, Back pain      Mar 26, 2023  [place] Emergency Room: [facility].  [place]  Emergency      Pelvic and perineal pain      Abdominal Pain      Abdominal pain, Back pain      Dec 06, 2022  [place] Emergency Room: [facility].  [place]  Emergency      Pelvic and perineal pain      Back Pain      Abdominal Pain      abdominal pain, back pain      Oct 24, 2022  [place] Emergency Room: [facility].  [place]  Emergency      Cystitis, unspecified without hematuria      Dysmenorrhea, unspecified      Abdominal Pain       Vaginal Bleeding      abdominal pain; emesis      Sep 29, 2022  [place] Emergency Room: [facility].  [place]  Emergency      Polycystic ovarian syndrome      Unspecified ovarian cyst, left side      Left lower quadrant pain      Abdominal Pain        Recent Inpatient Visit Summary  No Recent Inpatient Visits were found.  Care Team  No Care Team was found.  [facility]  This patient has registered at the [facility] Emergency Room: [facility] Emergency Department  For more information visit: [email or web address]     PLEASE NOTE:     1.   Any care  recommendations and other clinical information are provided as guidelines or for historical purposes only, and providers should exercise their own clinical judgment when providing care.    2.   You may only use this information for purposes of treatment, payment or health care operations activities, and subject to the limitations of applicable [facility] Policies.    3.   You should consult directly with the organization that provided a care guideline or other clinical history with any questions about additional information or accuracy or completeness of information provided.    ? 2025 [facility] - [email or web address]

## 2023-09-12 ENCOUNTER — Emergency Department
Admission: EM | Admit: 2023-09-12 | Discharge: 2023-09-12 | Disposition: A | Discharge status: Home / Self Care | Attending: Emergency Medicine | Admitting: Emergency Medicine

## 2023-09-12 DIAGNOSIS — R1030 Lower abdominal pain, unspecified: Secondary | ICD-10-CM | POA: Insufficient documentation

## 2023-09-12 DIAGNOSIS — N809 Endometriosis, unspecified: Secondary | ICD-10-CM | POA: Insufficient documentation

## 2023-09-12 HISTORY — DX: Essential (primary) hypertension: I10

## 2023-09-12 LAB — LAB USE ONLY - CBC WITH DIFFERENTIAL
Absolute Basophils: 0.03 10*3/uL (ref 0.00–0.08)
Absolute Eosinophils: 0.06 10*3/uL (ref 0.00–0.44)
Absolute Immature Granulocytes: 0.01 10*3/uL (ref 0.00–0.07)
Absolute Lymphocytes: 2.63 10*3/uL (ref 0.42–3.22)
Absolute Monocytes: 0.35 10*3/uL (ref 0.21–0.85)
Absolute Neutrophils: 2.64 10*3/uL (ref 1.10–6.33)
Absolute nRBC: 0 10*3/uL (ref <=0.00)
Basophils %: 0.5 %
Eosinophils %: 1 %
Hematocrit: 35.6 % (ref 34.7–43.7)
Hemoglobin: 12.1 g/dL (ref 11.4–14.8)
Immature Granulocytes %: 0.2 %
Lymphocytes %: 46 %
MCH: 31.5 pg (ref 25.1–33.5)
MCHC: 34 g/dL (ref 31.5–35.8)
MCV: 92.7 fL (ref 78.0–96.0)
MPV: 8.8 fL — ABNORMAL LOW (ref 8.9–12.5)
Monocytes %: 6.1 %
Neutrophils %: 46.2 %
Platelet Count: 335 10*3/uL (ref 142–346)
Preliminary Absolute Neutrophil Count: 2.64 10*3/uL (ref 1.10–6.33)
RBC: 3.84 10*6/uL — ABNORMAL LOW (ref 3.90–5.10)
RDW: 13 % (ref 11–15)
WBC: 5.72 10*3/uL (ref 3.10–9.50)
nRBC %: 0 /100{WBCs} (ref <=0.0)

## 2023-09-12 LAB — COMPREHENSIVE METABOLIC PANEL
ALT: 19 U/L (ref <=55)
AST (SGOT): 25 U/L (ref <=41)
Albumin/Globulin Ratio: 0.9 (ref 0.9–2.2)
Albumin: 3.5 g/dL (ref 3.5–5.0)
Alkaline Phosphatase: 83 U/L (ref 37–117)
Anion Gap: 10 (ref 5.0–15.0)
BUN: 15 mg/dL (ref 7–21)
Bilirubin, Total: 0.2 mg/dL (ref 0.2–1.2)
CO2: 25 meq/L (ref 17–29)
Calcium: 9.4 mg/dL (ref 8.5–10.5)
Chloride: 104 meq/L (ref 99–111)
Creatinine: 0.9 mg/dL (ref 0.4–1.0)
GFR: >60 mL/min/{1.73_m2} (ref >=60.0)
Globulin: 3.9 g/dL — ABNORMAL HIGH (ref 2.0–3.6)
Glucose: 98 mg/dL (ref 70–100)
Potassium: 3.5 meq/L (ref 3.5–5.3)
Protein, Total: 7.4 g/dL (ref 6.0–8.3)
Sodium: 139 meq/L (ref 135–145)

## 2023-09-12 LAB — LAB USE ONLY - URINE GRAY CULTURE HOLD TUBE

## 2023-09-12 LAB — URINALYSIS WITH REFLEX TO MICROSCOPIC EXAM - REFLEX TO CULTURE
Urine Bilirubin: NEGATIVE
Urine Glucose: NEGATIVE
Urine Nitrite: NEGATIVE
Urine Specific Gravity: 1.027 (ref 1.001–1.035)
Urine Urobilinogen: NORMAL mg/dL (ref 0.2–2.0)
Urine pH: 6 (ref 5.0–8.0)

## 2023-09-12 LAB — LIPASE: Lipase: 44 U/L (ref 8–78)

## 2023-09-12 LAB — MAGNESIUM: Magnesium: 1.8 mg/dL (ref 1.6–2.6)

## 2023-09-12 LAB — SERUM HCG, QUALITATIVE: hCG Qualitative: NEGATIVE

## 2023-09-12 MED ORDER — IBUPROFEN 600 MG PO TABS
600.0000 mg | ORAL_TABLET | Freq: Three times a day (TID) | ORAL | 0 refills | Status: DC | PRN
Start: 2023-09-12 — End: 2023-10-24

## 2023-09-12 MED ORDER — OXYCODONE HCL 5 MG PO TABS
5.0000 mg | ORAL_TABLET | Freq: Four times a day (QID) | ORAL | 0 refills | Status: AC | PRN
Start: 2023-09-12 — End: 2023-09-19

## 2023-09-12 MED ORDER — ONDANSETRON HCL 4 MG/2ML IJ SOLN
4.0000 mg | Freq: Once | INTRAMUSCULAR | Status: AC
Start: 2023-09-12 — End: 2023-09-12
  Administered 2023-09-12: 4 mg via INTRAVENOUS
  Filled 2023-09-12: qty 2

## 2023-09-12 MED ORDER — SODIUM CHLORIDE 0.9 % IV BOLUS
1000.0000 mL | Freq: Once | INTRAVENOUS | Status: AC
Start: 2023-09-12 — End: 2023-09-12
  Administered 2023-09-12: 1000 mL via INTRAVENOUS

## 2023-09-12 MED ORDER — MORPHINE SULFATE 4 MG/ML IJ/IV SOLN (WRAP)
4.0000 mg | Freq: Once | Status: AC
Start: 2023-09-12 — End: 2023-09-12
  Administered 2023-09-12: 4 mg via INTRAVENOUS
  Filled 2023-09-12: qty 1

## 2023-09-12 MED ORDER — KETOROLAC TROMETHAMINE 30 MG/ML IJ SOLN
15.0000 mg | Freq: Once | INTRAMUSCULAR | Status: AC
Start: 2023-09-12 — End: 2023-09-12
  Administered 2023-09-12: 15 mg via INTRAVENOUS
  Filled 2023-09-12: qty 1

## 2023-09-12 MED ORDER — ONDANSETRON 4 MG PO TBDP
4.0000 mg | ORAL_TABLET | Freq: Four times a day (QID) | ORAL | 0 refills | Status: DC | PRN
Start: 2023-09-12 — End: 2023-10-24

## 2023-09-12 MED ORDER — PROMETHAZINE HCL 25 MG PO TABS
25.0000 mg | ORAL_TABLET | Freq: Four times a day (QID) | ORAL | 0 refills | Status: AC | PRN
Start: 2023-09-12 — End: 2023-09-16

## 2023-09-12 NOTE — ED Notes (Signed)
 Pt made aware of the need for urine. Pt ambulated to restroom with a steady gait.

## 2023-09-12 NOTE — Discharge Instructions (Addendum)
 Your labs today were reassuring. We gave you fluids, nausea and pain medications and you felt better. Please follow up with your OBGYN.     Take tylenol  1000 mg every 8 hours and alternate it with ibuprofen  600 mg every 8 hours so you have something for pain every 4 hours. Please take 1 oxycodone  as needed for severe breakthrough pain. Take 1 zofran  every 6 hours as needed for nausea and vomiting. Please do not drink alcohol or drive after taking the oxycodone .

## 2023-09-12 NOTE — EDIE (Signed)
 [facility] NOTIFICATION 09/12/2023 07:43 [name], [name] DOB: [birth year] MRN: [id number]    Criteria Met      5 ED Visits in 12 Months    Security and Safety  No Security Events were found.  ED Care Guidelines  There are currently no ED Care Guidelines for this patient. Please check your facility's medical records system.        Prescription Drug Data  No Prescription Drug Data was found.    E.D. Visit Count (12 mo.)  Facility Visits   [place] Emergency [facility]: [facility] 9   Total 9   Note: Visits indicate total known visits.     Recent Emergency Department Visit Summary  Date Facility [facility] Type Diagnoses or Chief Complaint    Sep 12, 2023  [facility] Emergency Room: [facility] [place]  [place]  Emergency      stomach and back pain      Jul 25, 2023  [place] Emergency Room: [facility].  [place]  Emergency      Personal history of other diseases of the female genital tract      Nausea with vomiting, unspecified      Acute cough      Influenza due to other identified influenza virus with other respiratory manifestations      Emesis      light headed, can't keep food down      Jul 17, 2023  [place] Emergency Room: [facility].  [place]  Emergency      Other specified noninflammatory disorders of vagina      Influenza due to other identified influenza virus with other respiratory manifestations      Lower abdominal pain, unspecified      Abdominal Pain      Jun 17, 2023  [place] Emergency Room: [facility].  [place]  Emergency      Pelvic and perineal pain      Other chronic pain      Abdominal pain      Jun 14, 2023  [place] Emergency Room: [facility].  [place]  Emergency      Lower abdominal pain, unspecified      Abdominal Pain      Back Pain      Abdominal pain, Back pain      Mar 26, 2023  [place] Emergency Room: [facility].  [place]  Emergency      Pelvic and perineal pain      Abdominal Pain      Abdominal pain, Back pain      Dec 06, 2022  [place]  Emergency Room: [facility].  [place]  Emergency      Pelvic and perineal pain      Back Pain      Abdominal Pain      abdominal pain, back pain      Oct 24, 2022  [place] Emergency Room: [facility].  [place]  Emergency      Cystitis, unspecified without hematuria      Dysmenorrhea, unspecified      Abdominal Pain      Vaginal Bleeding      abdominal pain; emesis      Sep 29, 2022  [place] Emergency Room: [facility].    Emergency      Polycystic ovarian syndrome      Unspecified ovarian cyst,  left side      Left lower quadrant pain      Abdominal Pain        Recent Inpatient Visit Summary  No Recent Inpatient Visits were found.  Care Team  No Care Team was found.  [facility]  This patient has registered at the [facility] Emergency Room: [facility] Emergency Department  For more information visit: [email or web address]     PLEASE NOTE:     1.   Any care recommendations and other clinical information are provided as guidelines or for historical purposes only, and providers should exercise their own clinical judgment when providing care.    2.   You may only use this information for purposes of treatment, payment or health care operations activities, and subject to the limitations of applicable [facility] Policies.    3.   You should consult directly with the organization that provided a care guideline or other clinical history with any questions about additional information or accuracy or completeness of information provided.    ? 2025 [facility] - [email or web address]

## 2023-09-12 NOTE — ED Provider Notes (Signed)
 EMERGENCY DEPARTMENT NOTE     Patient initially seen and examined at   ED PHYSICIAN ASSIGNED       Date/Time Event User Comments    09/12/23 0803 Physician Assigned Cheyeanne Roadcap Hillel Card M, MD assigned as Attending           ED MIDLEVEL (APP) ASSIGNED       None            HISTORY OF PRESENT ILLNESS   Translator Used : No    Chief Complaint: Pelvic Pain, Flank Pain, and Nausea       33 y.o. female with a history of endometriosis, HTN and PCOS, presents to the ER with nausea, flank pain and pelvic pain. She states she is in an endometriosis flare up. She states her period started on 09/07/23 and the pain started 2 days prior. She notes she has pain every time she gets her period but notes sometimes it worse than others. She states she comes to the ER every 2-3 months for the same symptoms. She rates her pain currently a 10/10, sharp aching throbbing pain. She notes at times it feels like spasms. She notes it hurts in her lower abdomen and lower back.     No fevers, no dysuria, no hematuria, no diarrhea, no constipation.    +Nausea and vomiting    She has tried tylenol  and ibuprofen  without relief.     Independent Historian (other than patient): No  Additional History Provided by Independent Historian:  MEDICAL HISTORY     Past Medical History:  Past Medical History:   Diagnosis Date    Asthma     Endometriosis     Headache     Hypertension     PCOS (polycystic ovarian syndrome)        Past Surgical History:  Past Surgical History[1]    Social History:  @SOC @    Family History:  Family History[2]    Outpatient Medication:  Previous Medications    ACETAMINOPHEN  (TYLENOL ) 325 MG TABLET    Take 2 tablets (650 mg) by mouth every 6 (six) hours as needed for Pain    ALBUTEROL  SULFATE HFA (PROVENTIL ) 108 (90 BASE) MCG/ACT INHALER    Inhale 2 puffs into the lungs every 6 (six) hours as needed for Wheezing    AMLODIPINE (NORVASC) 2.5 MG TABLET    Take 1 tablet (2.5 mg) by mouth daily     BUTALBITAL -ACETAMINOPHEN -CAFFEINE  (FIORICET) 50-325-40 MG PER TABLET    Take 1 tablet by mouth every 4 (four) hours as needed for Headaches    DIPHENHYDRAMINE HCL PO    Take by mouth    DOCUSATE SODIUM  (COLACE) 100 MG CAPSULE    Take 1 capsule (100 mg total) by mouth daily    ELAGOLIX SODIUM 200 MG TAB    Take by mouth    FAMOTIDINE (PEPCID) 20 MG TABLET    Take 20 mg by mouth every 12 (twelve) hours    HYDROCORTISONE  1 % OINTMENT    Apply topically 3 (three) times daily as needed (perineal care)    IBUPROFEN  (ADVIL ) 600 MG TABLET    Take 1 tablet (600 mg) by mouth every 6 (six) hours as needed for Pain or Fever    KETOROLAC  (TORADOL ) 10 MG TABLET    Take 1 tablet (10 mg) by mouth every 6 (six) hours as needed for Pain    LANOLIN OINTMENT    Apply topically every 2 (two) hours as needed for Dry Skin (  nipple discomfort)    NORGESTIMATE-ETHINYL ESTRADIOL (ORTHO-CYCLEN) 0.25-35 MG-MCG PER TABLET    Take 1 tablet by mouth daily    ONDANSETRON  (ZOFRAN -ODT) 4 MG DISINTEGRATING TABLET    Take 1 tablet (4 mg) by mouth every 6 (six) hours as needed for Nausea    ONDANSETRON  (ZOFRAN -ODT) 4 MG DISINTEGRATING TABLET    Take 1 tablet (4 mg) by mouth every 6 (six) hours as needed for Nausea    PRENATAL MV-MIN-FE FUM-FA-DHA (PRENATAL 1 PO)    Take by mouth    PROMETHAZINE  (PHENERGAN ) 25 MG TABLET    Take 1 tablet (25 mg) by mouth every 6 (six) hours as needed for Nausea    SENNA-DOCUSATE (PERICOLACE) 8.6-50 MG PER TABLET    Take 1 tablet by mouth nightly as needed for Constipation    TRAZODONE (DESYREL) 50 MG TABLET    Take 1 tablet (50 mg) by mouth nightly         REVIEW OF SYSTEMS   Review of Systems See History of Present Illness  PHYSICAL EXAM     ED Triage Vitals [09/12/23 0754]   Encounter Vitals Group      BP 140/90      Systolic BP Percentile       Diastolic BP Percentile       Heart Rate 85      Resp Rate 16      Temp 97.9 F (36.6 C)      Temp src Oral      SpO2 99 %      Weight 108.9 kg      Height 1.626 m      Head  Circumference       Peak Flow       Pain Score 10      Pain Loc       Pain Education       Exclude from Growth Chart      Physical Exam   General: Well developed, well nourished obese female appears comfortable  Head: Normocephalic, atraumatic.  Eyes: Extra-ocular motions intact. Pupils are equal and round and reactive to light bilaterally.   ENT: Moist mucus membranes  Respiratory: No respiratory distress. Lungs are clear to auscultation bilaterally with good air exchange.  CV: Regular rate. No murmur, gallop or rub.   Abdomen: Soft, supple and not distended. + tenderness to palpation across the lower abdomen. No rebound, no guarding  MSK: No swelling, deformity or cyanosis of the upper or lower extremities bilaterally.   Neuro: Awake, alert, moving all extremities equally and spontaneously   Skin: Normal color. Warm and dry.  Psych: Normal behavior     MEDICAL DECISION MAKING     PRIMARY PROBLEM LIST      Acute illness/injury with risk to life or bodily function (based on differential diagnosis or evaluation) DIAGNOSIS: Endometriosis, lower abdominal pain  Chronic Illness Impacting Care of the above problem: Hypertension, Obesity, and Advanced age endometriosis Increases complexity of evaluation, Increases the risk of severe disease, and Increase the risk of disease progression      DISCUSSION      33 year old female with a history of endometriosis, hypertension and PCOS, presents emergency room with nausea, flank pain and pelvic pain in the setting of endometriosis flare.  She notes she has this pain every month but sometimes it is worse.  She was last here 2 months prior for evaluation and had a CT and ultrasound which were reassuring.  She does not wish to get  more imaging today as she states this is a flare of her chronic pain.  Plan for labs, urinalysis, fluids, Zofran  Toradol  and morphine  to help treat her endometriosis flare.    If patient is being hospitalized is severe sepsis or septic shock suspected?:  N/A            Narrative & Impression   HISTORY: Lower abdominal pain     COMPARISON: September 29, 2022     TECHNIQUE: CT of the abdomen and pelvis performed with intravenous  contrast. The following dose reduction techniques were utilized: automated  exposure control and/or adjustment of the mA and/or KV according to patient  size, and the use of an iterative reconstruction technique.     CONTRAST: iohexol  (OMNIPAQUE ) 350 MG/ML injection 100 mL     FINDINGS: Unremarkable liver and gallbladder. No dilatation of the biliary  tree. Unremarkable spleen, pancreas, adrenals, kidneys, and abdominal  aorta. The uterus is present. No adnexal masses or cystic lesions are seen.  Nondistended bladder. Normal appendix. No dilated bowel loops or bowel wall  thickening. No free intraperitoneal gas or free fluid. No definite colonic  diverticula. No urinary tract calculi are seen. Included portions of the  lungs and mediastinum are also unremarkable.     IMPRESSION:    No abnormal findings.     Deward Holt, MD  07/17/2023 4:25 PM       Narrative & Impression   HISTORY: Pelvic pain. LMP is 07/06/2023.     COMPARISON: 10/24/2022     TECHNIQUE: Both transabdominal and endovaginal imaging was performed.  Transabdominal examination of the pelvis was performed via partially  distended urinary bladder. Color and spectral Doppler evaluation of the  ovaries was performed to evaluate for torsion.      FINDINGS:  Measurements:  Uterus: 6.5 x 3.2 x 4.3 cm  Endometrium (bilayer thickness): 0.7 cm  Right ovary: 3.0 x 1.7 x 1.5 cm  Left ovary: 3.0 x 1.9 x 1.7 cm     Uterus and cervix are normal in appearance. The myometrial echotexture is  normal.     Endometrial stripe thickness is within normal limits. No focal endometrial  abnormality is identified.     The ovaries are normal in appearance bilaterally. No suspicious adnexal  mass is identified. Normal arterial and venous Doppler waveforms are noted  within both ovaries.     No free fluid is  seen.     IMPRESSION:      Normal pelvic ultrasound      Norleen Ruth, MD  07/17/2023 3:55 PM           External Records Reviewed?: [facility]  Prescription Drug Monitor Reviewed, and no concerning prescriptions noted.    Additional Notes                     Vital Signs: Reviewed the patient's vital signs.   Nursing Notes: Reviewed and utilized available nursing notes.  Medical Records Reviewed: Reviewed available past medical records.  Counseling: The emergency provider has spoken with the patient and discussed today's findings, in addition to providing specific details for the plan of care.  Questions are answered and there is agreement with the plan.      10:00 AM  labs are reassuring. UA has blood and wbc but she is on her period. She notes her pain is improving but not gone. Will give another dose of pain medication    11:00 AM Patient feels better and is ready for  discharge home. Will send her home with a work note and pain and nausea medication. Recommend follow up with her OBGYN.       EMERGENCY DEPT. MEDICATIONS      ED Medication Orders (From admission, onward)      Start Ordered     Status Ordering Provider    09/12/23 1003 09/12/23 1002  morphine  injection 4 mg  Once        Route: Intravenous  Ordered Dose: 4 mg       Last MAR action: Given Dessa Ledee M    09/12/23 0834 09/12/23 9166  sodium chloride  0.9 % bolus 1,000 mL  Once        Route: Intravenous  Ordered Dose: 1,000 mL       Last MAR action: Stopped Fletcher Rathbun M    09/12/23 0834 09/12/23 0833  ondansetron  (ZOFRAN ) injection 4 mg  Once        Route: Intravenous  Ordered Dose: 4 mg       Last MAR action: Given Sura Canul M    09/12/23 9165 09/12/23 9166  ketorolac  (TORADOL ) injection 15 mg  Once        Route: Intravenous  Ordered Dose: 15 mg       Last MAR action: Given Shatika Grinnell M    09/12/23 0834 09/12/23 0833  morphine  injection 4 mg  Once        Route: Intravenous  Ordered Dose: 4 mg       Last MAR action: Given Farryn Linares  M            LABORATORY RESULTS    Ordered and independently interpreted AVAILABLE laboratory tests.   Results       Procedure Component Value Units Date/Time    Comprehensive Metabolic Panel [[id number]]  (Abnormal) Collected: 09/12/23 0822    Specimen: Blood, Venous Updated: 09/12/23 0943     Glucose 98 mg/dL      BUN 15 mg/dL      Creatinine 0.9 mg/dL      Sodium 860 mEq/L      Potassium 3.5 mEq/L      Chloride 104 mEq/L      CO2 25 mEq/L      Calcium  9.4 mg/dL      Anion Gap 89.9     GFR >60.0 mL/min/1.73 m2      AST (SGOT) 25 U/L      ALT 19 U/L      Alkaline Phosphatase 83 U/L      Albumin 3.5 g/dL      Protein, Total 7.4 g/dL      Globulin 3.9 g/dL      Albumin/Globulin Ratio 0.9     Bilirubin, Total 0.2 mg/dL     Lipase [[id number]]  (Normal) Collected: 09/12/23 0822    Specimen: Blood, Venous Updated: 09/12/23 0943     Lipase 44 U/L     Magnesium  [[id number]]  (Normal) Collected: 09/12/23 0822    Specimen: Blood, Venous Updated: 09/12/23 0943     Magnesium  1.8 mg/dL     Serum Beta HCG Qualitative [[id number]]  (Normal) Collected: 09/12/23 0822    Specimen: Blood, Venous Updated: 09/12/23 0939     hCG Qualitative Negative    Urinalysis with Reflex to Microscopic Exam and Culture [[id number]]  (Abnormal) Collected: 09/12/23 0813    Specimen: Urine, Clean Catch Updated: 09/12/23 0837     Urine Color Yellow     Urine Clarity Clear     Urine  Specific Gravity 1.027     Urine pH 6.0     Urine Leukocyte Esterase Large     Urine Nitrite Negative     Urine Protein 30= 1+     Urine Glucose Negative     Urine Ketones Trace mg/dL      Urine Urobilinogen Normal mg/dL      Urine Bilirubin Negative     Urine Blood Trace     RBC, UA 6-10 /hpf      Urine WBC 11-25 /hpf      Urine Squamous Epithelial Cells 0-5 /hpf      Urine Mucus Present    Culture, Urine [[id number]] Collected: 09/12/23 0813    Specimen: Urine, Clean Catch Updated: 09/12/23 0837    CBC with Differential (Order) [[id number]]  (Abnormal) Collected: 09/12/23  0822    Specimen: Blood, Venous Updated: 09/12/23 9167    Narrative:      The following orders were created for panel order CBC with Differential (Order).  Procedure                               Abnormality         Status                     ---------                               -----------         ------                     CBC with Differential (...[[id number]]  Abnormal            Final result                 Please view results for these tests on the individual orders.    CBC with Differential (Component) [[id number]]  (Abnormal) Collected: 09/12/23 0822    Specimen: Blood, Venous Updated: 09/12/23 0832     WBC 5.72 x10 3/uL      Hemoglobin 12.1 g/dL      Hematocrit 64.3 %      Platelet Count 335 x10 3/uL      MPV 8.8 fL      RBC 3.84 x10 6/uL      MCV 92.7 fL      MCH 31.5 pg      MCHC 34.0 g/dL      RDW 13 %      nRBC % 0.0 /100 WBC      Absolute nRBC 0.00 x10 3/uL      Preliminary Absolute Neutrophil Count 2.64 x10 3/uL      Neutrophils % 46.2 %      Lymphocytes % 46.0 %      Monocytes % 6.1 %      Eosinophils % 1.0 %      Basophils % 0.5 %      Immature Granulocytes % 0.2 %      Absolute Neutrophils 2.64 x10 3/uL      Absolute Lymphocytes 2.63 x10 3/uL      Absolute Monocytes 0.35 x10 3/uL      Absolute Eosinophils 0.06 x10 3/uL      Absolute Basophils 0.03 x10 3/uL      Absolute Immature Granulocytes 0.01 x10 3/uL     Urine [employer]  Tube [[id number]] Collected: 09/12/23 0813    Specimen: Urine, Clean Catch Updated: 09/12/23 0830              CRITICAL CARE/PROCEDURES    Procedures    DIAGNOSIS      Diagnosis:  Final diagnoses:   Endometriosis   Lower abdominal pain       Disposition:  ED Disposition       ED Disposition   Discharge Boarder to Home    Condition   --    Date/Time   Mon Sep 12, 2023 10:53 AM    Comment   --               Prescriptions:  Patient's Medications   New Prescriptions    IBUPROFEN  (ADVIL ) 600 MG TABLET    Take 1 tablet (600 mg) by mouth every 8 (eight) hours as needed for  Pain    ONDANSETRON  (ZOFRAN -ODT) 4 MG DISINTEGRATING TABLET    Take 1 tablet (4 mg) by mouth every 6 (six) hours as needed for Nausea    OXYCODONE  (ROXICODONE ) 5 MG IMMEDIATE RELEASE TABLET    Take 1 tablet (5 mg) by mouth every 6 (six) hours as needed for Pain   Previous Medications    ACETAMINOPHEN  (TYLENOL ) 325 MG TABLET    Take 2 tablets (650 mg) by mouth every 6 (six) hours as needed for Pain    ALBUTEROL  SULFATE HFA (PROVENTIL ) 108 (90 BASE) MCG/ACT INHALER    Inhale 2 puffs into the lungs every 6 (six) hours as needed for Wheezing    AMLODIPINE (NORVASC) 2.5 MG TABLET    Take 1 tablet (2.5 mg) by mouth daily    BUTALBITAL -ACETAMINOPHEN -CAFFEINE  (FIORICET) 50-325-40 MG PER TABLET    Take 1 tablet by mouth every 4 (four) hours as needed for Headaches    DIPHENHYDRAMINE HCL PO    Take by mouth    DOCUSATE SODIUM  (COLACE) 100 MG CAPSULE    Take 1 capsule (100 mg total) by mouth daily    ELAGOLIX SODIUM 200 MG TAB    Take by mouth    FAMOTIDINE (PEPCID) 20 MG TABLET    Take 20 mg by mouth every 12 (twelve) hours    HYDROCORTISONE  1 % OINTMENT    Apply topically 3 (three) times daily as needed (perineal care)    IBUPROFEN  (ADVIL ) 600 MG TABLET    Take 1 tablet (600 mg) by mouth every 6 (six) hours as needed for Pain or Fever    KETOROLAC  (TORADOL ) 10 MG TABLET    Take 1 tablet (10 mg) by mouth every 6 (six) hours as needed for Pain    LANOLIN OINTMENT    Apply topically every 2 (two) hours as needed for Dry Skin (nipple discomfort)    NORGESTIMATE-ETHINYL ESTRADIOL (ORTHO-CYCLEN) 0.25-35 MG-MCG PER TABLET    Take 1 tablet by mouth daily    ONDANSETRON  (ZOFRAN -ODT) 4 MG DISINTEGRATING TABLET    Take 1 tablet (4 mg) by mouth every 6 (six) hours as needed for Nausea    ONDANSETRON  (ZOFRAN -ODT) 4 MG DISINTEGRATING TABLET    Take 1 tablet (4 mg) by mouth every 6 (six) hours as needed for Nausea    PRENATAL MV-MIN-FE FUM-FA-DHA (PRENATAL 1 PO)    Take by mouth    PROMETHAZINE  (PHENERGAN ) 25 MG TABLET    Take 1 tablet (25  mg) by mouth every 6 (six) hours as needed for Nausea    SENNA-DOCUSATE (PERICOLACE) 8.6-50 MG PER TABLET  Take 1 tablet by mouth nightly as needed for Constipation    TRAZODONE (DESYREL) 50 MG TABLET    Take 1 tablet (50 mg) by mouth nightly   Modified Medications    No medications on file   Discontinued Medications    No medications on file           This note was generated by the [employer] EMR system/ Dragon speech recognition and may contain inherent errors or omissions not intended by the user. Grammatical errors, random word insertions, deletions and pronoun errors  are occasional consequences of this technology due to software limitations. Not all errors are caught or corrected. If there are questions or concerns about the content of this note or information contained within the body of this dictation they should be addressed directly with the author for clarification.           [1]   Past Surgical History:  Procedure Laterality Date    COLPOSCOPY  2018    HAND SURGERY Left 07/26/2014    PELVIC LAPAROSCOPY  01/04/2023    TONSILLECTOMY  2019    TONSILLECTOMY     [2] No family history on file.       Alvy Alsop M, MD  09/12/23 1100

## 2023-09-14 LAB — CULTURE, URINE: Culture Urine: NORMAL

## 2023-09-26 ENCOUNTER — Emergency Department
Admission: EM | Admit: 2023-09-26 | Discharge: 2023-09-26 | Disposition: A | Discharge status: Home / Self Care | Attending: Emergency Medicine | Admitting: Emergency Medicine

## 2023-09-26 ENCOUNTER — Emergency Department

## 2023-09-26 DIAGNOSIS — R0602 Shortness of breath: Secondary | ICD-10-CM

## 2023-09-26 DIAGNOSIS — J45901 Unspecified asthma with (acute) exacerbation: Secondary | ICD-10-CM | POA: Insufficient documentation

## 2023-09-26 DIAGNOSIS — E876 Hypokalemia: Secondary | ICD-10-CM | POA: Insufficient documentation

## 2023-09-26 LAB — COMPREHENSIVE METABOLIC PANEL
ALT: 42 U/L (ref <=55)
AST (SGOT): 18 U/L (ref <=41)
Albumin/Globulin Ratio: 0.9 (ref 0.9–2.2)
Albumin: 3.3 g/dL — ABNORMAL LOW (ref 3.5–5.0)
Alkaline Phosphatase: 82 U/L (ref 37–117)
Anion Gap: 10 (ref 5.0–15.0)
BUN: 14 mg/dL (ref 7–21)
Bilirubin, Total: 0.2 mg/dL (ref 0.2–1.2)
CO2: 24 meq/L (ref 17–29)
Calcium: 8.8 mg/dL (ref 8.5–10.5)
Chloride: 104 meq/L (ref 99–111)
Creatinine: 0.8 mg/dL (ref 0.4–1.0)
GFR: >60 mL/min/{1.73_m2} (ref >=60.0)
Globulin: 3.5 g/dL (ref 2.0–3.6)
Glucose: 82 mg/dL (ref 70–100)
Potassium: 3.2 meq/L — ABNORMAL LOW (ref 3.5–5.3)
Protein, Total: 6.8 g/dL (ref 6.0–8.3)
Sodium: 138 meq/L (ref 135–145)

## 2023-09-26 LAB — COVID-19 (SARS-COV-2) & INFLUENZA  A/B, NAA (ROCHE LIAT)
Influenza A RNA: NOT DETECTED
Influenza B RNA: NOT DETECTED
SARS-CoV-2 (COVID-19) RNA: NOT DETECTED

## 2023-09-26 LAB — ECG 12-LEAD
Atrial Rate: 97 {beats}/min
IHS MUSE NARRATIVE AND IMPRESSION: NORMAL
P Axis: 49 degrees
P-R Interval: 160 ms
Q-T Interval: 360 ms
QRS Duration: 80 ms
QTC Calculation (Bezet): 457 ms
R Axis: 25 degrees
T Axis: 3 degrees
Ventricular Rate: 97 {beats}/min

## 2023-09-26 LAB — URINE HCG QUALITATIVE: Urine HCG Qualitative: NEGATIVE

## 2023-09-26 LAB — LAB USE ONLY - CBC WITH DIFFERENTIAL
Absolute nRBC: 0 10*3/uL (ref <=0.00)
Hematocrit: 34.5 % — ABNORMAL LOW (ref 34.7–43.7)
Hemoglobin: 11.6 g/dL (ref 11.4–14.8)
MCH: 31.7 pg (ref 25.1–33.5)
MCHC: 33.6 g/dL (ref 31.5–35.8)
MCV: 94.3 fL (ref 78.0–96.0)
MPV: 8.2 fL — ABNORMAL LOW (ref 8.9–12.5)
Platelet Count: 353 10*3/uL — ABNORMAL HIGH (ref 142–346)
Preliminary Absolute Neutrophil Count: 7.1 10*3/uL — ABNORMAL HIGH (ref 1.10–6.33)
RBC: 3.66 10*6/uL — ABNORMAL LOW (ref 3.90–5.10)
RDW: 13 % (ref 11–15)
WBC: 14 10*3/uL — ABNORMAL HIGH (ref 3.10–9.50)
nRBC %: 0 /100{WBCs} (ref <=0.0)

## 2023-09-26 LAB — HIGH SENSITIVITY TROPONIN-I: hs Troponin: <2.7 ng/L (ref <=14.0)

## 2023-09-26 MED ORDER — POTASSIUM CHLORIDE CRYS ER 20 MEQ PO TBCR
40.0000 meq | EXTENDED_RELEASE_TABLET | Freq: Once | ORAL | Status: AC
Start: 2023-09-26 — End: 2023-09-26
  Administered 2023-09-26: 40 meq via ORAL
  Filled 2023-09-26: qty 2

## 2023-09-26 MED ORDER — IPRATROPIUM BROMIDE 0.02 % IN SOLN
0.5000 mg | Freq: Once | RESPIRATORY_TRACT | Status: AC
Start: 2023-09-26 — End: 2023-09-26
  Administered 2023-09-26: 0.5 mg via RESPIRATORY_TRACT
  Filled 2023-09-26: qty 2.5

## 2023-09-26 MED ORDER — PREDNISONE 20 MG PO TABS
40.0000 mg | ORAL_TABLET | Freq: Every day | ORAL | 0 refills | Status: AC
Start: 2023-09-26 — End: 2023-10-01

## 2023-09-26 MED ORDER — POTASSIUM CHLORIDE CRYS ER 10 MEQ PO TBCR
20.0000 meq | EXTENDED_RELEASE_TABLET | Freq: Every day | ORAL | 0 refills | Status: AC
Start: 2023-09-26 — End: 2023-09-29

## 2023-09-26 MED ORDER — ALBUTEROL SULFATE (2.5 MG/3ML) 0.083% IN NEBU
7.5000 mg | INHALATION_SOLUTION | Freq: Once | RESPIRATORY_TRACT | Status: AC
Start: 2023-09-26 — End: 2023-09-26
  Administered 2023-09-26: 7.5 mg via RESPIRATORY_TRACT
  Filled 2023-09-26: qty 9

## 2023-09-26 NOTE — ED Notes (Signed)
 EMERGENCY DEPARTMENT ATTENDING PHYSICIAN NOTE     I performed the substantive portion of the MDM. For the problems addressed, I personally developed, reviewed, and/or approved the plan and assessment as documented by the APP.  Patient was seen by APP, see their note for further documentation of history and exam.    BRIEF HISTORY OF PRESENT ILLNESS AND ADDITIONAL EXAM FINDINGS     Chief Complaint: Chest Pain       History per APP    33 y.o. female presents with chest pain    Patient with history of asthma. Came in with Cp associated with hoarse voice, SOB, and cough. Similar feeling to asthma.       Triage Vitals:  ED Triage Vitals [09/26/23 1131]   Encounter Vitals Group      BP 150/84      Systolic BP Percentile       Diastolic BP Percentile       Heart Rate (!) 101      Resp Rate 18      Temp 97.9 F (36.6 C)      Temp src       SpO2 100 %      Weight       Height       Head Circumference       Peak Flow       Pain Score 7      Pain Loc       Pain Education       Exclude from Growth Chart             MEDICAL DECISION MAKING   Per APP symptoms improved with nebs. Basic labs and trop unremarkable. Mild WBC elevation but negative COVID swab and CXR. Given improvement with symptoms, and URI symptoms of cough and hoarse voice, do favor viral URI with mild asthma exacerbation.         Vital Signs: Reviewed the patient's vital signs.   Nursing Notes: Reviewed and utilized available nursing notes.  Medical Records Reviewed: Reviewed available past medical records.    CARDIAC STUDIES    The following cardiac studies were independently interpreted by me the Emergency Medicine Physician.  For full cardiac study results please see chart. I discussed testing results with the APP.              EMERGENCY IMAGING STUDIES    The following imagine studies were independently interpreted by me (emergency medicine physician). I discussed testing results with the APP.                     RADIOLOGY IMAGING STUDIES      XR Chest 2 Views    Final Result    No evidence of acute cardiopulmonary disease.      Lonni Hamburg, MD   09/26/2023 1:15 PM          EMERGENCY DEPT. MEDICATIONS      ED Medication Orders (From admission, onward)      Start Ordered     Status Ordering Provider    09/26/23 1325 09/26/23 1324  potassium chloride  (KLOR-CON  M20) CR tablet 40 mEq  Once        Route: Oral  Ordered Dose: 40 mEq       Last MAR action: Given ROGERS, CASEY K    09/26/23 1152 09/26/23 1151  albuterol  (PROVENTIL ) (2.5 MG/3ML) 0.083% nebulizer solution 7.5 mg  RT - Once        Route:  Nebulization  Ordered Dose: 7.5 mg       Last MAR action: Given ROGERS, CASEY K    09/26/23 1152 09/26/23 1151  ipratropium (ATROVENT ) 0.02 % nebulizer solution 0.5 mg  RT - Once        Route: Nebulization  Ordered Dose: 0.5 mg       Last MAR action: Given ROGERS, CASEY K            LABORATORY RESULTS    Ordered and independently interpreted AVAILABLE laboratory tests.   Results       Procedure Component Value Units Date/Time    Comprehensive Metabolic Panel [[id number]]  (Abnormal) Collected: 09/26/23 1216    Specimen: Blood, Venous Updated: 09/26/23 1254     Glucose 82 mg/dL      BUN 14 mg/dL      Creatinine 0.8 mg/dL      Sodium 861 mEq/L      Potassium 3.2 mEq/L      Chloride 104 mEq/L      CO2 24 mEq/L      Calcium  8.8 mg/dL      Anion Gap 89.9     GFR >60.0 mL/min/1.73 m2      AST (SGOT) 18 U/L      ALT 42 U/L      Alkaline Phosphatase 82 U/L      Albumin 3.3 g/dL      Protein, Total 6.8 g/dL      Globulin 3.5 g/dL      Albumin/Globulin Ratio 0.9     Bilirubin, Total 0.2 mg/dL     CBC with Differential (Order) [[id number]]  (Abnormal) Collected: 09/26/23 1216    Specimen: Blood, Venous Updated: 09/26/23 1252    Narrative:      The following orders were created for panel order CBC with Differential (Order).  Procedure                               Abnormality         Status                     ---------                               -----------         ------                      CBC with Differential (...[[id number]]  Abnormal            Final result                 Please view results for these tests on the individual orders.    CBC with Differential (Component) [[id number]]  (Abnormal) Collected: 09/26/23 1216    Specimen: Blood, Venous Updated: 09/26/23 1252     WBC 14.00 x10 3/uL      Hemoglobin 11.6 g/dL      Hematocrit 65.4 %      Platelet Count 353 x10 3/uL      MPV 8.2 fL      RBC 3.66 x10 6/uL      MCV 94.3 fL      MCH 31.7 pg      MCHC 33.6 g/dL      RDW 13 %      nRBC % 0.0 /100 WBC  Absolute nRBC 0.00 x10 3/uL      Preliminary Absolute Neutrophil Count 7.10 x10 3/uL     High Sensitivity Troponin-I [[id number]]  (Normal) Collected: 09/26/23 1216    Specimen: Blood, Venous Updated: 09/26/23 1244     hs Troponin <2.7 ng/L     COVID-19 and Influenza ([place]) (symptomatic) [[id number]]  (Normal) Collected: 09/26/23 1204    Specimen: Swab from Anterior Nares Updated: 09/26/23 1242     SARS-CoV-2 (COVID-19) RNA Not Detected     Influenza A RNA Not Detected     Influenza B RNA Not Detected    Narrative:      A result of Detected indicates POSITIVE for the presence of viral RNA  A result of Not Detected indicates NEGATIVE for the presence of viral RNA    Test performed using the Roche cobas [place] SARS-CoV-2 & Influenza A/B assay. This is a multiplex real-time RT-PCR assay for the detection of SARS-CoV-2, influenza A, and influenza B virus RNA. Viral nucleic acids may persist in vivo, independent of viability. Detection of viral nucleic acid does not imply the presence of infectious virus, or that virus nucleic acid is the cause of clinical symptoms. Negative results do not preclude SARS-CoV-2, influenza A, and/or influenza B infection and should not be used as the sole basis for diagnosis, treatment or other patient management decisions. Invalid results may be due to inhibiting substances in the specimen and recollection should occur.     Urine HCG Qualitative [[id number]]   (Normal) Collected: 09/26/23 1216    Specimen: Urine, Clean Catch Updated: 09/26/23 1225     Urine HCG Qualitative Negative              CRITICAL CARE/PROCEDURES        DIAGNOSIS    I (ED Physician) discussed final disposition with the APP    Diagnosis:  Final diagnoses:   Exacerbation of asthma, unspecified asthma severity, unspecified whether persistent   Hypokalemia       Disposition:  ED Disposition       ED Disposition   Discharge    Condition   --    Date/Time   Mon Sep 26, 2023  2:01 PM    Comment   Angela [name] discharge to home/self care.    Condition at disposition: Stable                 Prescriptions:  Patient's Medications   New Prescriptions    POTASSIUM CHLORIDE  (KLOR-CON  M10) 10 MEQ CR TABLET    Take 2 tablets (20 mEq) by mouth daily for 3 days    PREDNISONE  (DELTASONE ) 20 MG TABLET    Take 2 tablets (40 mg) by mouth daily for 5 days   Previous Medications    ACETAMINOPHEN  (TYLENOL ) 325 MG TABLET    Take 2 tablets (650 mg) by mouth every 6 (six) hours as needed for Pain    ALBUTEROL  SULFATE HFA (PROVENTIL ) 108 (90 BASE) MCG/ACT INHALER    Inhale 2 puffs into the lungs every 6 (six) hours as needed for Wheezing    AMLODIPINE (NORVASC) 2.5 MG TABLET    Take 1 tablet (2.5 mg) by mouth daily    BUTALBITAL -ACETAMINOPHEN -CAFFEINE  (FIORICET) 50-325-40 MG PER TABLET    Take 1 tablet by mouth every 4 (four) hours as needed for Headaches    DIPHENHYDRAMINE HCL PO    Take by mouth    DOCUSATE SODIUM  (COLACE) 100 MG CAPSULE    Take 1 capsule (100 mg total)  by mouth daily    ELAGOLIX SODIUM 200 MG TAB    Take by mouth    FAMOTIDINE (PEPCID) 20 MG TABLET    Take 20 mg by mouth every 12 (twelve) hours    HYDROCORTISONE  1 % OINTMENT    Apply topically 3 (three) times daily as needed (perineal care)    IBUPROFEN  (ADVIL ) 600 MG TABLET    Take 1 tablet (600 mg) by mouth every 6 (six) hours as needed for Pain or Fever    IBUPROFEN  (ADVIL ) 600 MG TABLET    Take 1 tablet (600 mg) by mouth every 8 (eight) hours as  needed for Pain    KETOROLAC  (TORADOL ) 10 MG TABLET    Take 1 tablet (10 mg) by mouth every 6 (six) hours as needed for Pain    LANOLIN OINTMENT    Apply topically every 2 (two) hours as needed for Dry Skin (nipple discomfort)    NORGESTIMATE-ETHINYL ESTRADIOL (ORTHO-CYCLEN) 0.25-35 MG-MCG PER TABLET    Take 1 tablet by mouth daily    ONDANSETRON  (ZOFRAN -ODT) 4 MG DISINTEGRATING TABLET    Take 1 tablet (4 mg) by mouth every 6 (six) hours as needed for Nausea    ONDANSETRON  (ZOFRAN -ODT) 4 MG DISINTEGRATING TABLET    Take 1 tablet (4 mg) by mouth every 6 (six) hours as needed for Nausea    ONDANSETRON  (ZOFRAN -ODT) 4 MG DISINTEGRATING TABLET    Take 1 tablet (4 mg) by mouth every 6 (six) hours as needed for Nausea    PRENATAL MV-MIN-FE FUM-FA-DHA (PRENATAL 1 PO)    Take by mouth    PROMETHAZINE  (PHENERGAN ) 25 MG TABLET    Take 1 tablet (25 mg) by mouth every 6 (six) hours as needed for Nausea    SENNA-DOCUSATE (PERICOLACE) 8.6-50 MG PER TABLET    Take 1 tablet by mouth nightly as needed for Constipation    TRAZODONE (DESYREL) 50 MG TABLET    Take 1 tablet (50 mg) by mouth nightly   Modified Medications    No medications on file   Discontinued Medications    No medications on file          Garon Eden, MD  09/26/23 1432

## 2023-09-26 NOTE — EDIE (Signed)
 [facility] NOTIFICATION 09/26/2023 11:28 Mezera, Vegas S DOB: 01/10/1991 MRN: [id number]    Criteria Met      5 ED Visits in 12 Months    Security and Safety  No Security Events were found.  ED Care Guidelines  There are currently no ED Care Guidelines for this patient. Please check your facility's medical records system.        Prescription Drug Data  No Prescription Drug Data was found.    E.D. Visit Count (12 mo.)  Facility Visits   [place] [facility]: [facility] 10   Total 10   Note: Visits indicate total known visits.     Recent Emergency Department Visit Summary  Date Facility [facility] Type Diagnoses or Chief Complaint    Sep 26, 2023  [facility] Emergency Room: [facility] [place]  [place]  [place]  Emergency      chest tightness      Sep 12, 2023  [place] [facility]: [facility].  [place]  Emergency      Lower abdominal pain, unspecified      Endometriosis, unspecified      Flank Pain      Nausea      Pelvic Pain      stomach and back pain      Jul 25, 2023  [place] Emergency Room: [facility].  [place]  Emergency      Personal history of other diseases of the female genital tract      Nausea with vomiting, unspecified      Acute cough      Influenza due to other identified influenza virus with other respiratory manifestations      Emesis      light headed, can't keep food down      Jul 17, 2023  [place] [facility]: [facility].  [place]  Emergency      Other specified noninflammatory disorders of vagina      Influenza due to other identified influenza virus with other respiratory manifestations      Lower abdominal pain, unspecified      Abdominal Pain      Jun 17, 2023  [place] Emergency Room: [facility].  [place]  Emergency      Pelvic and perineal pain      Other chronic pain      Abdominal pain      Jun 14, 2023  [place] [facility]: [facility].  [place]  Emergency      Lower abdominal pain, unspecified      Abdominal Pain      Back Pain       Abdominal pain, Back pain      Mar 26, 2023  [place] Emergency Room: [facility]  [place]  Emergency      Pelvic and perineal pain      Abdominal Pain      Abdominal pain, Back pain      Dec 06, 2022  [place] Emergency [facility]: [facility]  [place]  Emergency      Pelvic and perineal pain      Back Pain      Abdominal Pain      abdominal pain, back pain      Oct 24, 2022  [place] Emergency Room: [facility].    Emergency      Cystitis, unspecified without hematuria  Dysmenorrhea, unspecified      Abdominal Pain      Vaginal Bleeding      abdominal pain; emesis      Sep 29, 2022  [place] Emergency [facility]: [facility].  [place]  Emergency      Polycystic ovarian syndrome      Unspecified ovarian cyst, left side      Left lower quadrant pain      Abdominal Pain        Recent Inpatient Visit Summary  No Recent Inpatient Visits were found.  Care Team  No Care Team was found.  [facility]  This patient has registered at the [facility] Emergency Room: [facility] Emergency Department  For more information visit: [email or web address]     PLEASE NOTE:     1.   Any care recommendations and other clinical information are provided as guidelines or for historical purposes only, and providers should exercise their own clinical judgment when providing care.    2.   You may only use this information for purposes of treatment, payment or health care operations activities, and subject to the limitations of applicable [facility] Policies.    3.   You should consult directly with the organization that provided a care guideline or other clinical history with any questions about additional information or accuracy or completeness of information provided.    ? 2025 [facility] - www.[email or web address]

## 2023-09-26 NOTE — ED Triage Notes (Signed)
 Patient alert and oriented x4- ambulatroy to triage with steady gait, c/o cough, hoarseness, and wheezing over past week. Patient has hx of asthma, states she has been using inhaler with no success. Well appearing in triage, speaking in clear sentences, no SOB. Endorses chest tightness with deep inspiration.

## 2023-09-26 NOTE — ED Notes (Signed)
 Patient states her chest feels less tight after the nebulizer. Patient is attached to the cardiac monitor. Patient is showing some PVCs. MD notified.

## 2023-09-26 NOTE — ED Provider Notes (Signed)
 EMERGENCY DEPARTMENT NOTE     Patient initially seen and examined at   ED PHYSICIAN ASSIGNED       None           ED MIDLEVEL (APP) ASSIGNED       Date/Time Event User Comments    09/26/23 1129 PA/NP Provider Assigned Zamarah Ullmer MARLA Sharl Augustin MARLA, PA assigned as Physician Assistant            HISTORY OF PRESENT ILLNESS       Chief Complaint: Chest Pain       33 y.o. female with past medical history as below presents to ED for suspected asthma exacerbation. Per patient she has history of asthma that is typically tricks with seasonal allergies. This started about 8 days ago and has been worsening. She has been taking her inhaler and using home nebulizer. Today she tried to return to work and while sitting at her desk symptoms worsened again. Endorsing chest tightness, voice hoarseness, cough, shortness of breath. Chest tightness does not radiate. Does not worsen with exertion. Denies leg pain or leg swelling. Feels like her asthma but worse than normal. Has never needed to be hospitalized overnight for asthma.     Independent Historian (other than patient): No  Additional History Provided by Independent Historian:  MEDICAL HISTORY     Past Medical History:  Past Medical History:   Diagnosis Date    Asthma     Endometriosis     Headache     Hypertension     PCOS (polycystic ovarian syndrome)        Past Surgical History:  Past Surgical History[1]    Social History:  Social History[2]    Family History:  Family History[3]    Outpatient Medication:  Previous Medications    ACETAMINOPHEN  (TYLENOL ) 325 MG TABLET    Take 2 tablets (650 mg) by mouth every 6 (six) hours as needed for Pain    ALBUTEROL  SULFATE HFA (PROVENTIL ) 108 (90 BASE) MCG/ACT INHALER    Inhale 2 puffs into the lungs every 6 (six) hours as needed for Wheezing    AMLODIPINE (NORVASC) 2.5 MG TABLET    Take 1 tablet (2.5 mg) by mouth daily    BUTALBITAL -ACETAMINOPHEN -CAFFEINE  (FIORICET) 50-325-40 MG PER TABLET    Take 1 tablet by mouth every 4 (four) hours  as needed for Headaches    DIPHENHYDRAMINE HCL PO    Take by mouth    DOCUSATE SODIUM  (COLACE) 100 MG CAPSULE    Take 1 capsule (100 mg total) by mouth daily    ELAGOLIX SODIUM 200 MG TAB    Take by mouth    FAMOTIDINE (PEPCID) 20 MG TABLET    Take 20 mg by mouth every 12 (twelve) hours    HYDROCORTISONE  1 % OINTMENT    Apply topically 3 (three) times daily as needed (perineal care)    IBUPROFEN  (ADVIL ) 600 MG TABLET    Take 1 tablet (600 mg) by mouth every 6 (six) hours as needed for Pain or Fever    IBUPROFEN  (ADVIL ) 600 MG TABLET    Take 1 tablet (600 mg) by mouth every 8 (eight) hours as needed for Pain    KETOROLAC  (TORADOL ) 10 MG TABLET    Take 1 tablet (10 mg) by mouth every 6 (six) hours as needed for Pain    LANOLIN OINTMENT    Apply topically every 2 (two) hours as needed for Dry Skin (nipple discomfort)    NORGESTIMATE-ETHINYL ESTRADIOL (  ORTHO-CYCLEN) 0.25-35 MG-MCG PER TABLET    Take 1 tablet by mouth daily    ONDANSETRON  (ZOFRAN -ODT) 4 MG DISINTEGRATING TABLET    Take 1 tablet (4 mg) by mouth every 6 (six) hours as needed for Nausea    ONDANSETRON  (ZOFRAN -ODT) 4 MG DISINTEGRATING TABLET    Take 1 tablet (4 mg) by mouth every 6 (six) hours as needed for Nausea    ONDANSETRON  (ZOFRAN -ODT) 4 MG DISINTEGRATING TABLET    Take 1 tablet (4 mg) by mouth every 6 (six) hours as needed for Nausea    PRENATAL MV-MIN-FE FUM-FA-DHA (PRENATAL 1 PO)    Take by mouth    PROMETHAZINE  (PHENERGAN ) 25 MG TABLET    Take 1 tablet (25 mg) by mouth every 6 (six) hours as needed for Nausea    SENNA-DOCUSATE (PERICOLACE) 8.6-50 MG PER TABLET    Take 1 tablet by mouth nightly as needed for Constipation    TRAZODONE (DESYREL) 50 MG TABLET    Take 1 tablet (50 mg) by mouth nightly         REVIEW OF SYSTEMS   Review of Systems See History of Present Illness  PHYSICAL EXAM     ED Triage Vitals [09/26/23 1131]   Encounter Vitals Group      BP 150/84      Systolic BP Percentile       Diastolic BP Percentile       Heart Rate (!) 101       Resp Rate 18      Temp 97.9 F (36.6 C)      Temp src       SpO2 100 %      Weight       Height       Head Circumference       Peak Flow       Pain Score 7      Pain Loc       Pain Education       Exclude from Growth Chart      Physical Exam  Constitutional:       General: She is not in acute distress.     Comments: Sitting up in stretcher, speaking in full sentences   HENT:      Head: Normocephalic and atraumatic.      Mouth/Throat:      Mouth: Mucous membranes are moist.      Pharynx: No oropharyngeal exudate.   Cardiovascular:      Rate and Rhythm: Normal rate and regular rhythm.      Pulses: Normal pulses.      Heart sounds: Normal heart sounds.   Pulmonary:      Breath sounds: No rhonchi.   Chest:      Chest wall: No tenderness.   Abdominal:      Palpations: Abdomen is soft.   Musculoskeletal:      Cervical back: No rigidity or tenderness.   Lymphadenopathy:      Cervical: No cervical adenopathy.   Skin:     General: Skin is warm and dry.      Capillary Refill: Capillary refill takes less than 2 seconds.   Neurological:      Mental Status: She is alert.       MEDICAL DECISION MAKING     PRIMARY PROBLEM LIST      Acute illness/injury with risk to life or bodily function (based on differential diagnosis or evaluation) DIAGNOSIS: Asthma exacerbation, hypokalemia  Chronic Illness Impacting Care  of the above problem: Asthma Increases complexity of evaluation   asthma exacerbation, bronchitis, pneumonia, electrolyte disturbance, arrhythmia, unlikely ACS, COVID, flu    DISCUSSION      33 year old female with history of asthma presents to emergency department with a few days of chest tightness and wheezing.  She was afebrile in the emergency department.  She was sitting up in the stretcher and speaking in full sentences.  She had significant improvement in her symptoms after nebulizer treatment.  Labs did show hypokalemia so supplemented with p.o. potassium.  Will give her a few days of potassium as she will be  continuing to use albuterol .  EKG was consistent with her baseline, negative troponin, low risk heart score.  Feel ACS unlikely.  CXR without signs of consolidation.  Viral panel negative.  Feel likely symptoms are from her asthma.  Will discharge on course of prednisone .  She has enough inhaler and nebulizer at home.  Discussed continued symptomatic management as well as ED return precautions.  Patient was agreeable to plan and verbalized understanding    If patient is being hospitalized is severe sepsis or septic shock suspected?: N/A           Heart Score:  Used for ACS Risk Stratification    Low Score (0-3 points) = Risk of MACE of 0.9-1.7%  Moderate Score (4-6 point) = Risk of MACE of 12-16.66%  High Score (7-10 points) = Risk of MACE of 50-65%    History: Slightly suspicious (0)  ECG: Non-specific repolarization disturbance (+1)  Age: < 45 (0)  Risk: 1-2 risk factors (+1)  Troponin: <= normal limit (0)  Score: 2        External Records Reviewed?: [place]  Prescription Drug Monitor Reviewed, and no concerning prescriptions noted.    Additional Notes              ED Course as of 09/26/23 1407   Mon Sep 26, 2023   1324 Potassium(!): 3.2  Will supplement with PO potassium  [CR]      ED Course User Index  [CR] Sharl Augustin POUR, PA     Discussed case with ED attending, Garon Eden, MD, who is agreeable to the plan of care, treatment and disposition.       Vital Signs: Reviewed the patient's vital signs.   Nursing Notes: Reviewed and utilized available nursing notes.  Medical Records Reviewed: Reviewed available past medical records.  Counseling: The emergency provider has spoken with the patient and discussed today's findings, in addition to providing specific details for the plan of care.  Questions are answered and there is agreement with the plan.      CARDIAC STUDIES    The following cardiac studies were independently interpreted by me the Emergency Medicine Provider.  For full cardiac study results please see  chart.              EKG 1 interpreted by me (ED provider)  Comparison: Yes.  No acute changes.  DATE:01/16/22  Time Interpreted:1150  Rate: 60-100  Rhythm: Normal Sinus Rhythm  ST segments: No acute changes  STEMI?: NO  EKG interpretation: Normal                      RADIOLOGY IMAGING STUDIES      XR Chest 2 Views   Final Result    No evidence of acute cardiopulmonary disease.      Lonni Hamburg, MD   09/26/2023 1:15 PM  EMERGENCY IMAGING STUDIES    The following imagine studies were independently interpreted by me (emergency medicine provider):    Chest Xray Interpreted by me (ED provider) SIDE : N/A  Comparison: None available  RESULT: No infiltrate. No pneumothorax. No large hemothorax. No CHF.  IMPRESSION: No acute abnormality              EMERGENCY DEPT. MEDICATIONS      ED Medication Orders (From admission, onward)      Start Ordered     Status Ordering Provider    09/26/23 1325 09/26/23 1324  potassium chloride  (KLOR-CON  M20) CR tablet 40 mEq  Once        Route: Oral  Ordered Dose: 40 mEq       Last MAR action: Given Franklin Baumbach K    09/26/23 1152 09/26/23 1151  albuterol  (PROVENTIL ) (2.5 MG/3ML) 0.083% nebulizer solution 7.5 mg  RT - Once        Route: Nebulization  Ordered Dose: 7.5 mg       Last MAR action: Given Codie Krogh K    09/26/23 1152 09/26/23 1151  ipratropium (ATROVENT ) 0.02 % nebulizer solution 0.5 mg  RT - Once        Route: Nebulization  Ordered Dose: 0.5 mg       Last MAR action: Given Joh Rao K            LABORATORY RESULTS    Ordered and independently interpreted AVAILABLE laboratory tests.   Results       Procedure Component Value Units Date/Time    Comprehensive Metabolic Panel [[id number]]  (Abnormal) Collected: 09/26/23 1216    Specimen: Blood, Venous Updated: 09/26/23 1254     Glucose 82 mg/dL      BUN 14 mg/dL      Creatinine 0.8 mg/dL      Sodium 861 mEq/L      Potassium 3.2 mEq/L      Chloride 104 mEq/L      CO2 24 mEq/L      Calcium  8.8 mg/dL      Anion Gap  89.9     GFR >60.0 mL/min/1.73 m2      AST (SGOT) 18 U/L      ALT 42 U/L      Alkaline Phosphatase 82 U/L      Albumin 3.3 g/dL      Protein, Total 6.8 g/dL      Globulin 3.5 g/dL      Albumin/Globulin Ratio 0.9     Bilirubin, Total 0.2 mg/dL     CBC with Differential (Order) [[id number]]  (Abnormal) Collected: 09/26/23 1216    Specimen: Blood, Venous Updated: 09/26/23 1252    Narrative:      The following orders were created for panel order CBC with Differential (Order).  Procedure                               Abnormality         Status                     ---------                               -----------         ------  CBC with Differential (...[[id number]]  Abnormal            Final result                 Please view results for these tests on the individual orders.    CBC with Differential (Component) [[id number]]  (Abnormal) Collected: 09/26/23 1216    Specimen: Blood, Venous Updated: 09/26/23 1252     WBC 14.00 x10 3/uL      Hemoglobin 11.6 g/dL      Hematocrit 65.4 %      Platelet Count 353 x10 3/uL      MPV 8.2 fL      RBC 3.66 x10 6/uL      MCV 94.3 fL      MCH 31.7 pg      MCHC 33.6 g/dL      RDW 13 %      nRBC % 0.0 /100 WBC      Absolute nRBC 0.00 x10 3/uL      Preliminary Absolute Neutrophil Count 7.10 x10 3/uL     High Sensitivity Troponin-I [[id number]]  (Normal) Collected: 09/26/23 1216    Specimen: Blood, Venous Updated: 09/26/23 1244     hs Troponin <2.7 ng/L     COVID-19 and Influenza ([place]) (symptomatic) [[id number]]  (Normal) Collected: 09/26/23 1204    Specimen: Swab from Anterior Nares Updated: 09/26/23 1242     SARS-CoV-2 (COVID-19) RNA Not Detected     Influenza A RNA Not Detected     Influenza B RNA Not Detected    Narrative:      A result of Detected indicates POSITIVE for the presence of viral RNA  A result of Not Detected indicates NEGATIVE for the presence of viral RNA    Test performed using the Roche cobas [place] SARS-CoV-2 & Influenza A/B assay. This is a  multiplex real-time RT-PCR assay for the detection of SARS-CoV-2, influenza A, and influenza B virus RNA. Viral nucleic acids may persist in vivo, independent of viability. Detection of viral nucleic acid does not imply the presence of infectious virus, or that virus nucleic acid is the cause of clinical symptoms. Negative results do not preclude SARS-CoV-2, influenza A, and/or influenza B infection and should not be used as the sole basis for diagnosis, treatment or other patient management decisions. Invalid results may be due to inhibiting substances in the specimen and recollection should occur.     Urine HCG Qualitative [[id number]]  (Normal) Collected: 09/26/23 1216    Specimen: Urine, Clean Catch Updated: 09/26/23 1225     Urine HCG Qualitative Negative              CRITICAL CARE/PROCEDURES    Procedures  DIAGNOSIS      Diagnosis:  Final diagnoses:   Exacerbation of asthma, unspecified asthma severity, unspecified whether persistent   Hypokalemia       Disposition:  ED Disposition       ED Disposition   Discharge    Condition   --    Date/Time   Mon Sep 26, 2023  2:01 PM    Comment   Joya Jewell Bartell discharge to home/self care.    Condition at disposition: Stable                 Prescriptions:  Patient's Medications   New Prescriptions    POTASSIUM CHLORIDE  (KLOR-CON  M10) 10 MEQ CR TABLET    Take 2 tablets (20 mEq) by mouth daily for 3 days  PREDNISONE  (DELTASONE ) 20 MG TABLET    Take 2 tablets (40 mg) by mouth daily for 5 days   Previous Medications    ACETAMINOPHEN  (TYLENOL ) 325 MG TABLET    Take 2 tablets (650 mg) by mouth every 6 (six) hours as needed for Pain    ALBUTEROL  SULFATE HFA (PROVENTIL ) 108 (90 BASE) MCG/ACT INHALER    Inhale 2 puffs into the lungs every 6 (six) hours as needed for Wheezing    AMLODIPINE (NORVASC) 2.5 MG TABLET    Take 1 tablet (2.5 mg) by mouth daily    BUTALBITAL -ACETAMINOPHEN -CAFFEINE  (FIORICET) 50-325-40 MG PER TABLET    Take 1 tablet by mouth every 4 (four) hours as  needed for Headaches    DIPHENHYDRAMINE HCL PO    Take by mouth    DOCUSATE SODIUM  (COLACE) 100 MG CAPSULE    Take 1 capsule (100 mg total) by mouth daily    ELAGOLIX SODIUM 200 MG TAB    Take by mouth    FAMOTIDINE (PEPCID) 20 MG TABLET    Take 20 mg by mouth every 12 (twelve) hours    HYDROCORTISONE  1 % OINTMENT    Apply topically 3 (three) times daily as needed (perineal care)    IBUPROFEN  (ADVIL ) 600 MG TABLET    Take 1 tablet (600 mg) by mouth every 6 (six) hours as needed for Pain or Fever    IBUPROFEN  (ADVIL ) 600 MG TABLET    Take 1 tablet (600 mg) by mouth every 8 (eight) hours as needed for Pain    KETOROLAC  (TORADOL ) 10 MG TABLET    Take 1 tablet (10 mg) by mouth every 6 (six) hours as needed for Pain    LANOLIN OINTMENT    Apply topically every 2 (two) hours as needed for Dry Skin (nipple discomfort)    NORGESTIMATE-ETHINYL ESTRADIOL (ORTHO-CYCLEN) 0.25-35 MG-MCG PER TABLET    Take 1 tablet by mouth daily    ONDANSETRON  (ZOFRAN -ODT) 4 MG DISINTEGRATING TABLET    Take 1 tablet (4 mg) by mouth every 6 (six) hours as needed for Nausea    ONDANSETRON  (ZOFRAN -ODT) 4 MG DISINTEGRATING TABLET    Take 1 tablet (4 mg) by mouth every 6 (six) hours as needed for Nausea    ONDANSETRON  (ZOFRAN -ODT) 4 MG DISINTEGRATING TABLET    Take 1 tablet (4 mg) by mouth every 6 (six) hours as needed for Nausea    PRENATAL MV-MIN-FE FUM-FA-DHA (PRENATAL 1 PO)    Take by mouth    PROMETHAZINE  (PHENERGAN ) 25 MG TABLET    Take 1 tablet (25 mg) by mouth every 6 (six) hours as needed for Nausea    SENNA-DOCUSATE (PERICOLACE) 8.6-50 MG PER TABLET    Take 1 tablet by mouth nightly as needed for Constipation    TRAZODONE (DESYREL) 50 MG TABLET    Take 1 tablet (50 mg) by mouth nightly   Modified Medications    No medications on file   Discontinued Medications    No medications on file           This note was generated by the Epic EMR system/ Dragon speech recognition and may contain inherent errors or omissions not intended by the user.  Grammatical errors, random word insertions, deletions and pronoun errors  are occasional consequences of this technology due to software limitations. Not all errors are caught or corrected. If there are questions or concerns about the content of this note or information contained within the body of this dictation they should  be addressed directly with the author for clarification.           [1]   Past Surgical History:  Procedure Laterality Date    COLPOSCOPY  2018    HAND SURGERY Left 07/26/2014    PELVIC LAPAROSCOPY  01/04/2023    TONSILLECTOMY  2019    TONSILLECTOMY     [2]   Social History  Socioeconomic History    Marital status: Married   Tobacco Use    Smoking status: Former     Types: Cigars     Quit date: 2019     Years since quitting: 6.2    Smokeless tobacco: Never   Vaping Use    Vaping status: Never Used   Substance and Sexual Activity    Alcohol use: Not Currently     Comment: socially    Drug use: Never    Sexual activity: Yes     Partners: Male     Birth control/protection: None     Social Drivers of Psychologist, Prison And Probation Services Strain: Low Risk  (09/29/2022)    Overall Financial Resource Strain (CARDIA)     Difficulty of Paying Living Expenses: Not very hard   Food Insecurity: No Food Insecurity (06/14/2023)    Hunger Vital Sign     Worried About Running Out of Food in the Last Year: Never true     Ran Out of Food in the Last Year: Never true   Transportation Needs: No Transportation Needs (06/14/2023)    PRAPARE - Therapist, Art (Medical): No     Lack of Transportation (Non-Medical): No   Physical Activity: Unknown (09/29/2022)    Exercise Vital Sign     Days of Exercise per Week: 5 days   Stress: No Stress Concern Present (09/29/2022)    [employer] of Occupational Health - Occupational Stress Questionnaire     Feeling of Stress : Not at all   Social Connections: Moderately Integrated (09/29/2022)    Social Connection and Isolation Panel [NHANES]     Frequency of  Communication with Friends and Family: More than three times a week     Frequency of Social Gatherings with Friends and Family: Never     Attends Religious Services: More than 4 times per year     Active Member of [employer] or Organizations: No     Attends Banker Meetings: Never     Marital Status: Married   Catering Manager Violence: Not At Risk (06/14/2023)    Humiliation, Afraid, Rape, and Kick questionnaire     Fear of Current or Ex-Partner: No     Emotionally Abused: No     Physically Abused: No     Sexually Abused: No   Housing Stability: Low Risk  (06/14/2023)    Housing Stability Vital Sign     Unable to Pay for Housing in the Last Year: No     Number of Times Moved in the Last Year: 1     Homeless in the Last Year: No   [3] No family history on file.       Sharl Augustin POUR, [place]  09/26/23 [phone]

## 2023-10-24 ENCOUNTER — Emergency Department
Admission: EM | Admit: 2023-10-24 | Discharge: 2023-10-24 | Disposition: A | Discharge status: Home / Self Care | Attending: Emergency Medicine | Admitting: Emergency Medicine

## 2023-10-24 DIAGNOSIS — R102 Pelvic and perineal pain: Secondary | ICD-10-CM | POA: Insufficient documentation

## 2023-10-24 DIAGNOSIS — R110 Nausea: Secondary | ICD-10-CM | POA: Insufficient documentation

## 2023-10-24 LAB — URINALYSIS WITH REFLEX TO MICROSCOPIC EXAM - REFLEX TO CULTURE
Urine Bilirubin: NEGATIVE
Urine Blood: NEGATIVE
Urine Glucose: NEGATIVE
Urine Ketones: NEGATIVE mg/dL
Urine Leukocyte Esterase: NEGATIVE
Urine Nitrite: NEGATIVE
Urine Specific Gravity: 1.023 (ref 1.001–1.035)
Urine Urobilinogen: NORMAL mg/dL (ref 0.2–2.0)
Urine pH: 6 (ref 5.0–8.0)

## 2023-10-24 LAB — LAB USE ONLY - CBC WITH DIFFERENTIAL
Absolute Basophils: 0.03 10*3/uL (ref 0.00–0.08)
Absolute Eosinophils: 0.05 10*3/uL (ref 0.00–0.44)
Absolute Immature Granulocytes: 0.02 10*3/uL (ref 0.00–0.07)
Absolute Lymphocytes: 3.6 10*3/uL — ABNORMAL HIGH (ref 0.42–3.22)
Absolute Monocytes: 0.6 10*3/uL (ref 0.21–0.85)
Absolute Neutrophils: 4.04 10*3/uL (ref 1.10–6.33)
Absolute nRBC: 0 10*3/uL (ref <=0.00)
Basophils %: 0.4 %
Eosinophils %: 0.6 %
Hematocrit: 36.5 % (ref 34.7–43.7)
Hemoglobin: 12.6 g/dL (ref 11.4–14.8)
Immature Granulocytes %: 0.2 %
Lymphocytes %: 43.2 %
MCH: 32.1 pg (ref 25.1–33.5)
MCHC: 34.5 g/dL (ref 31.5–35.8)
MCV: 92.9 fL (ref 78.0–96.0)
MPV: 8.5 fL — ABNORMAL LOW (ref 8.9–12.5)
Monocytes %: 7.2 %
Neutrophils %: 48.4 %
Platelet Count: 334 10*3/uL (ref 142–346)
Preliminary Absolute Neutrophil Count: 4.04 10*3/uL (ref 1.10–6.33)
RBC: 3.93 10*6/uL (ref 3.90–5.10)
RDW: 12 % (ref 11–15)
WBC: 8.34 10*3/uL (ref 3.10–9.50)
nRBC %: 0 /100{WBCs} (ref <=0.0)

## 2023-10-24 LAB — COMPREHENSIVE METABOLIC PANEL
ALT: 15 U/L (ref <=55)
AST (SGOT): 18 U/L (ref <=41)
Albumin/Globulin Ratio: 1 (ref 0.9–2.2)
Albumin: 3.8 g/dL (ref 3.5–5.0)
Alkaline Phosphatase: 86 U/L (ref 37–117)
Anion Gap: 13 (ref 5.0–15.0)
BUN: 12 mg/dL (ref 7–21)
Bilirubin, Total: 0.3 mg/dL (ref 0.2–1.2)
CO2: 25 meq/L (ref 17–29)
Calcium: 9.3 mg/dL (ref 8.5–10.5)
Chloride: 100 meq/L (ref 99–111)
Creatinine: 0.9 mg/dL (ref 0.4–1.0)
GFR: >60 mL/min/{1.73_m2} (ref >=60.0)
Globulin: 3.8 g/dL — ABNORMAL HIGH (ref 2.0–3.6)
Glucose: 86 mg/dL (ref 70–100)
Potassium: 3.4 meq/L — ABNORMAL LOW (ref 3.5–5.3)
Protein, Total: 7.6 g/dL (ref 6.0–8.3)
Sodium: 138 meq/L (ref 135–145)

## 2023-10-24 LAB — URINE HCG QUALITATIVE: Urine HCG Qualitative: NEGATIVE

## 2023-10-24 MED ORDER — ONDANSETRON 4 MG PO TBDP
4.0000 mg | ORAL_TABLET | Freq: Four times a day (QID) | ORAL | 0 refills | Status: AC | PRN
Start: 2023-10-24 — End: ?

## 2023-10-24 MED ORDER — KETOROLAC TROMETHAMINE 30 MG/ML IJ SOLN
15.0000 mg | Freq: Once | INTRAMUSCULAR | Status: AC
Start: 2023-10-24 — End: 2023-10-24
  Administered 2023-10-24: 15 mg via INTRAVENOUS
  Filled 2023-10-24: qty 1

## 2023-10-24 MED ORDER — ONDANSETRON HCL 4 MG/2ML IJ SOLN
4.0000 mg | Freq: Once | INTRAMUSCULAR | Status: AC
Start: 2023-10-24 — End: 2023-10-24
  Administered 2023-10-24: 4 mg via INTRAVENOUS
  Filled 2023-10-24: qty 2

## 2023-10-24 MED ORDER — OXYCODONE HCL 5 MG PO TABS
5.0000 mg | ORAL_TABLET | ORAL | 0 refills | Status: AC | PRN
Start: 2023-10-24 — End: 2023-10-31

## 2023-10-24 MED ORDER — MORPHINE SULFATE 4 MG/ML IJ/IV SOLN (WRAP)
4.0000 mg | Freq: Once | Status: AC
Start: 2023-10-24 — End: 2023-10-24
  Administered 2023-10-24: 4 mg via INTRAVENOUS
  Filled 2023-10-24: qty 1

## 2023-10-24 MED ORDER — IBUPROFEN 800 MG PO TABS
800.0000 mg | ORAL_TABLET | Freq: Four times a day (QID) | ORAL | 0 refills | Status: AC | PRN
Start: 2023-10-24 — End: ?

## 2023-10-24 NOTE — ED Notes (Signed)
 EMERGENCY DEPARTMENT ATTENDING PHYSICIAN NOTE     I performed the substantive portion of the MDM. For the problems addressed, I personally developed, reviewed, and/or approved the plan and assessment as documented by the APP.  Patient was seen by APP, see their note for further documentation of history and exam.    BRIEF HISTORY OF PRESENT ILLNESS AND ADDITIONAL EXAM FINDINGS     Chief Complaint: Abdominal Pain and Nausea       History per APP    33 y.o. female presents with lower pelvic pain. History of endometriosis and feels similar. Denies urinary symptoms     Triage Vitals:  ED Triage Vitals [10/24/23 1646]   Encounter Vitals Group      BP (!) 144/97      Systolic BP Percentile       Diastolic BP Percentile       Heart Rate 83      Resp Rate 18      Temp 98.3 F (36.8 C)      Temp src       SpO2 99 %      Weight 108.9 kg      Height       Head Circumference       Peak Flow       Pain Score 10      Pain Loc       Pain Education       Exclude from [facility] Chart             MEDICAL DECISION MAKING   Given history, plan for labs meds. Has had a number of recent CT which have been negative and if this feels similar to prior episodes of endo, do not feel strongly for imaging especially if symptoms improve. No focal tenderness on exam.         Vital Signs: Reviewed the patient's vital signs.   Nursing Notes: Reviewed and utilized available nursing notes.  Medical Records Reviewed: Reviewed available past medical records.    CARDIAC STUDIES    The following cardiac studies were independently interpreted by me the Emergency Medicine Physician.  For full cardiac study results please see chart. I discussed testing results with the APP.              EMERGENCY IMAGING STUDIES    The following imagine studies were independently interpreted by me (emergency medicine physician). I discussed testing results with the APP.                     RADIOLOGY IMAGING STUDIES      No orders to display       EMERGENCY DEPT. MEDICATIONS       ED Medication Orders (From admission, onward)      Start Ordered     Status Ordering Provider    10/24/23 1712 10/24/23 1711  ketorolac  (TORADOL ) injection 15 mg  Once        Route: Intravenous  Ordered Dose: 15 mg       Last MAR action: Given Sydnee Evans    10/24/23 1708 10/24/23 1707  ondansetron  (ZOFRAN ) injection 4 mg  Once        Route: Intravenous  Ordered Dose: 4 mg       Last MAR action: Given Sydnee Evans    10/24/23 1708 10/24/23 1707  morphine  injection 4 mg  Once        Route: Intravenous  Ordered Dose: 4 mg  Last MAR action: Given JOHNSON, [place] S            LABORATORY RESULTS    Ordered and independently interpreted AVAILABLE laboratory tests.   Results for orders placed or performed during the hospital encounter of 10/24/23 (from the past 24 hours)   Comprehensive Metabolic Panel    Collection Time: 10/24/23  5:26 PM   Result Value    Glucose 86    BUN 12    Creatinine 0.9    Sodium 138    Potassium 3.4 (L)    Chloride 100    CO2 25    Calcium  9.3    Anion Gap 13.0    GFR >60.0    AST (SGOT) 18    ALT 15    Alkaline Phosphatase 86    Albumin 3.8    Protein, Total 7.6    Globulin 3.8 (H)    Albumin/Globulin Ratio 1.0    Bilirubin, Total 0.3   CBC with Differential (Component)    Collection Time: 10/24/23  5:26 PM   Result Value    WBC 8.34    Hemoglobin 12.6    Hematocrit 36.5    Platelet Count 334    MPV 8.5 (L)    RBC 3.93    MCV 92.9    MCH 32.1    MCHC 34.5    RDW 12    nRBC % 0.0    Absolute nRBC 0.00    Preliminary Absolute Neutrophil Count 4.04    Neutrophils % 48.4    Lymphocytes % 43.2    Monocytes % 7.2    Eosinophils % 0.6    Basophils % 0.4    Immature Granulocytes % 0.2    Absolute Neutrophils 4.04    Absolute Lymphocytes 3.60 (H)    Absolute Monocytes 0.60    Absolute Eosinophils 0.05    Absolute Basophils 0.03    Absolute Immature Granulocytes 0.02   Urinalysis with Reflex to Microscopic Exam and Culture    Collection Time: 10/24/23  5:31 PM    Specimen: Urine, Clean Catch    Result Value    Urine Color Straw    Urine Clarity Clear    Urine Specific Gravity 1.023    Urine pH 6.0    Urine Leukocyte Esterase Negative    Urine Nitrite Negative    Urine Protein 10= Trace (A)    Urine Glucose Negative    Urine Ketones Negative    Urine Urobilinogen Normal    Urine Bilirubin Negative    Urine Blood Negative    RBC, UA 0-2    Urine WBC 0-5    Urine Squamous Epithelial Cells 0-5    Urine Mucus Present (A)    Collection Time: 10/24/23  5:31 PM   Result Value    Urine HCG Qualitative Negative         CRITICAL CARE/PROCEDURES        DIAGNOSIS    I (ED Physician) discussed final disposition with the APP    Diagnosis:  Final diagnoses:   None       Disposition:  ED Disposition       None            Prescriptions:  Patient's Medications   New Prescriptions    No medications on file   Previous Medications    ACETAMINOPHEN  (TYLENOL ) 325 MG TABLET    Take 2 tablets (650 mg) by mouth every 6 (six) hours as needed for Pain  ALBUTEROL  SULFATE HFA (PROVENTIL ) 108 (90 BASE) MCG/ACT INHALER    Inhale 2 puffs into the lungs every 6 (six) hours as needed for Wheezing    AMLODIPINE (NORVASC) 2.5 MG TABLET    Take 1 tablet (2.5 mg) by mouth daily    BUTALBITAL -ACETAMINOPHEN -CAFFEINE  (FIORICET) 50-325-40 MG PER TABLET    Take 1 tablet by mouth every 4 (four) hours as needed for Headaches    DIPHENHYDRAMINE HCL PO    Take by mouth    DOCUSATE SODIUM  (COLACE) 100 MG CAPSULE    Take 1 capsule (100 mg total) by mouth daily    ELAGOLIX SODIUM 200 MG TAB    Take by mouth    FAMOTIDINE (PEPCID) 20 MG TABLET    Take 20 mg by mouth every 12 (twelve) hours    HYDROCORTISONE  1 % OINTMENT    Apply topically 3 (three) times daily as needed (perineal care)    IBUPROFEN  (ADVIL ) 600 MG TABLET    Take 1 tablet (600 mg) by mouth every 6 (six) hours as needed for Pain or Fever    IBUPROFEN  (ADVIL ) 600 MG TABLET    Take 1 tablet (600 mg) by mouth every 8 (eight) hours as needed for Pain    KETOROLAC  (TORADOL ) 10 MG TABLET    Take  1 tablet (10 mg) by mouth every 6 (six) hours as needed for Pain    LANOLIN OINTMENT    Apply topically every 2 (two) hours as needed for Dry Skin (nipple discomfort)    NORGESTIMATE-ETHINYL ESTRADIOL (ORTHO-CYCLEN) 0.25-35 MG-MCG PER TABLET    Take 1 tablet by mouth daily    ONDANSETRON  (ZOFRAN -ODT) 4 MG DISINTEGRATING TABLET    Take 1 tablet (4 mg) by mouth every 6 (six) hours as needed for Nausea    ONDANSETRON  (ZOFRAN -ODT) 4 MG DISINTEGRATING TABLET    Take 1 tablet (4 mg) by mouth every 6 (six) hours as needed for Nausea    ONDANSETRON  (ZOFRAN -ODT) 4 MG DISINTEGRATING TABLET    Take 1 tablet (4 mg) by mouth every 6 (six) hours as needed for Nausea    PRENATAL MV-MIN-FE FUM-FA-DHA (PRENATAL 1 PO)    Take by mouth    PROMETHAZINE  (PHENERGAN ) 25 MG TABLET    Take 1 tablet (25 mg) by mouth every 6 (six) hours as needed for Nausea    SENNA-DOCUSATE (PERICOLACE) 8.6-50 MG PER TABLET    Take 1 tablet by mouth nightly as needed for Constipation    TRAZODONE (DESYREL) 50 MG TABLET    Take 1 tablet (50 mg) by mouth nightly   Modified Medications    No medications on file   Discontinued Medications    No medications on file          Dwane Gitelman, MD  10/24/23 2107

## 2023-10-24 NOTE — EDIE (Signed)
 [facility] NOTIFICATION 10/24/2023 10:37 Angela [name], Angela [name] DOB: 02-Nov-1990 MRN: [id number]    Criteria Met      5 ED Visits in 12 Months    Security and Safety  No Security Events were found.  ED Care Guidelines  There are currently no ED Care Guidelines for this patient. Please check your facility's medical records system.        Prescription Drug Data  No Prescription Drug Data was found.    E.D. Visit Count (12 mo.)  Facility Visits   [place] [facility]: [facility] 10   Total 10   Note: Visits indicate total known visits.     Recent Emergency Department Visit Summary  Date Facility [facility] Type Diagnoses or Chief Complaint    Oct 24, 2023  [facility] Emergency Room: [facility] [place]  [place]  Emergency      Abdominal pain,back pain      Sep 26, 2023  [place] [facility]: [facility].  [place]  Emergency      Hypokalemia      Unspecified asthma with (acute) exacerbation      Chest Pain      chest tightness      Sep 12, 2023  [place] [facility]: [facility].  [place]  Emergency      Lower abdominal pain, unspecified      Endometriosis, unspecified      Flank Pain      Nausea      Pelvic Pain      stomach and back pain      Jul 25, 2023  [place] [facility]: [facility].  [place]  Emergency      Personal history of other diseases of the female genital tract      Nausea with vomiting, unspecified      Acute cough      Influenza due to other identified influenza virus with other respiratory manifestations      Emesis      light headed, can't keep food down      Jul 17, 2023  [place] [facility]: [facility].  [place]  Emergency      Other specified noninflammatory disorders of vagina      Influenza due to other identified influenza virus with other respiratory manifestations      Lower abdominal pain, unspecified      Abdominal Pain      Jun 17, 2023  [place] Emergency [facility]: [facility].  [place]  Emergency      Pelvic and perineal pain       Other chronic pain      Abdominal pain      Jun 14, 2023  [place] [facility]: [facility].  [place]  Emergency      Lower abdominal pain, unspecified      Abdominal Pain      Back Pain      Abdominal pain, Back pain      Mar 26, 2023  [place] [facility]: [facility].  [place]  Emergency      Pelvic and perineal pain      Abdominal Pain      Abdominal pain, Back pain      Dec 06, 2022  [place] Emergency [facility]: [facility].  [place]  Emergency      Pelvic and perineal pain  Back Pain      Abdominal Pain      abdominal pain, back pain      Oct 24, 2022  [place] Emergency [facility]: [facility]  [place]  Emergency      Cystitis, unspecified without hematuria      Dysmenorrhea, unspecified      Abdominal Pain      Vaginal Bleeding      abdominal pain; emesis        Recent Inpatient Visit Summary  No Recent Inpatient Visits were found.  Care Team  No Care Team was found.  [facility]  This patient has registered at the [facility] Emergency Room: [facility] Emergency Department  For more information visit: [email or web address]     PLEASE NOTE:     1.   Any care recommendations and other clinical information are provided as guidelines or for historical purposes only, and providers should exercise their own clinical judgment when providing care.    2.   You may only use this information for purposes of treatment, payment or health care operations activities, and subject to the limitations of applicable [facility] Policies.    3.   You should consult directly with the organization that provided a care guideline or other clinical history with any questions about additional information or accuracy or completeness of information provided.    ? 2025 [facility] - [email or web address]

## 2023-10-24 NOTE — ED Provider Notes (Signed)
 [facility]  Emergency Department APP Note      Diagnosis/Disposition   ED Disposition:  Discharge    ED Diagnosis:  Pelvic pain in female    Discharge Medication List as of 10/24/2023  6:54 PM        START taking these medications    Details   oxyCODONE  (ROXICODONE ) 5 MG immediate release tablet Take 1-2 tablets (5-10 mg) by mouth every 4 (four) hours as needed for Pain, Starting Mon 10/24/2023, Until Mon 10/31/2023 at 2359, E-Rx               History of Present Illness     Chief Complaint: Abdominal Pain and Nausea     33 y.o. female with past medical history as below   History of Present Illness  Angela [name] is a 33 year old female who presents with recurrent pelvic pain.    She experiences recurrent pelvic pain every couple of months, described as unusual and persistent with a sensation of 'climbing'. This episode is more intense than previous ones.    Her last menstrual cycle began on October 20, 2023, and she is currently not menstruating.    She manages her pain with ibuprofen  800 mg and Tylenol . The last dose of ibuprofen  was taken the previous night, and she has not taken any pain medication today due to work commitments.    She reports associated nausea and urination issues that started last night. No other symptoms are reported.    Independent Historian (other than patient): {Historian:[phone]   Additional History Provided by Independent Historian:      Physical Exam     ED Triage Vitals [10/24/23 1646]   Encounter Vitals Group      BP (!) 144/97      Systolic BP Percentile       Diastolic BP Percentile       Heart Rate 83      Resp Rate 18      Temp 98.3 F (36.8 C)      Temp src       SpO2 99 %      Weight 108.9 kg      Height       Head Circumference       Peak Flow       Pain Score 10      Pain Loc       Pain Education       Exclude from Growth Chart       Physical Exam   Physical Exam      Nursing note and vitals reviewed.  Constitutional:  Well developed, well nourished.   Awake & alert.    Head:  Atraumatic.  Normocephalic.    ENT:  Mucous membranes are moist and intact.  Patent airway.  Neck:  Supple.  No JVD.   Cardiovascular:  Regular rate.  Regular rhythm.   Pulmonary/Chest:  No evidence of respiratory distress.    Abdominal:  Soft and non-distended.  There is no tenderness.  No rebound, guarding, or rigidity.  No masses palpable  Back:  No CVA tenderness.   Extremities:  No edema.   No cyanosis.  No clubbing.   Skin:  Skin is warm and dry.  No diaphoresis.    Neurological:  Alert, awake, and appropriate.  Normal speech.  Moves all extremities.  Normal gate.  Psychiatric:  Good eye contact.  Normal interaction, affect, and behavior  Medical Decision Making     PRIMARY PROBLEM LIST      1. Chronic Illness with SEVERE Exacerbation/Progression DIAGNOSIS:pelvic pain   Chronic Illness Impacting Care of the above problem: Obesity and Other (explain) endometriosis Increases complexity of evaluation   Differential Diagnosis: Abdominal pain non traumatic:  gastritis, gastroenteritis, enteritis, colitis, diverticulitis, appendicitis, cholecystitis, pancreatitis, AAA, obstruction, constipation, stone     DISCUSSION      Assessment & Plan  Pelvic pain  Chronic pelvic pain with episodic flares. Current episode unresponsive to ibuprofen  or acetaminophen . Surgical options discussed with OB, no decision made. Recent imaging consistent with symptoms, no new imaging needed.  - Administer IV analgesics.  - Order basic laboratory tests.  - Avoid additional imaging.  - Reassess post-medication administration.    Follow up with your GYN    Discussed case with ED attending, rahhal, who is agreeable to the plan of care, treatment and disposition.       The patient was deemed stable for discharge. They were given strict return precautions as it relates to their presumed diagnosis, verbalized understanding of these precautions and agreed to follow up as instructed. All questions were answered prior  to discharge.    Additional Notes                  Diagnostic test considered and not performed: Patient declined Ct ABD due to recent imagine and pain similar to the past after risk/benefit discussion. Patient fully understands the risks and benefits                        Vital Signs: Reviewed the patient's vital signs.   Nursing Notes: Reviewed and utilized available nursing notes.   Medical Records Reviewed: Reviewed available past medical records.   Counseling: The emergency provider has spoken with the patient and discussed today's findings, in addition to providing specific details for the plan of care. Questions are answered and there is agreement with the plan.     CRITICAL CARE/PROCEDURES    Procedures         CARDIAC STUDIES      The following cardiac studies were independently interpreted by me the Emergency Medicine Provider. For full cardiac study results please see chart.   Monitor Strip interpreted by me (ED provider)   Rate: 60-100   Rhythm: Normal Sinus Rhythm                                                          RADIOLOGY IMAGING STUDIES      No orders to display       EMERGENCY IMAGING STUDIES    The following imagine studies were independently interpreted by me (emergency medicine provider):  The following imagine studies were independently interpreted by me (emergency medicine provider):                                        Supplemental Encounter Data   Medical History[6]  Past Surgical History[7]  Social History[8]  Family History[9]  Allergies[10]    Encounter Orders:  Orders Placed This Encounter   Procedures    CBC with Differential (Order)    Comprehensive Metabolic Panel  Urinalysis with Reflex to Microscopic Exam and Culture    Urine Martina Sledge Culture Hold Tube    Urine HCG Qualitative    CBC with Differential (Component)     Medications Administered:  Medications   ondansetron  (ZOFRAN ) injection 4 mg (4 mg Intravenous Given 10/24/23 1733)   morphine  injection 4 mg (4 mg Intravenous  Given 10/24/23 1734)   ketorolac  (TORADOL ) injection 15 mg (15 mg Intravenous Given 10/24/23 1747)     Laboratory and Imaging Studies:  Results for orders placed or performed during the hospital encounter of 10/24/23 (from the past 24 hours)   Comprehensive Metabolic Panel    Collection Time: 10/24/23  5:26 PM   Result Value    Glucose 86    BUN 12    Creatinine 0.9    Sodium 138    Potassium 3.4 (L)    Chloride 100    CO2 25    Calcium  9.3    Anion Gap 13.0    GFR >60.0    AST (SGOT) 18    ALT 15    Alkaline Phosphatase 86    Albumin 3.8    Protein, Total 7.6    Globulin 3.8 (H)    Albumin/Globulin Ratio 1.0    Bilirubin, Total 0.3   CBC with Differential (Component)    Collection Time: 10/24/23  5:26 PM   Result Value    WBC 8.34    Hemoglobin 12.6    Hematocrit 36.5    Platelet Count 334    MPV 8.5 (L)    RBC 3.93    MCV 92.9    MCH 32.1    MCHC 34.5    RDW 12    nRBC % 0.0    Absolute nRBC 0.00    Preliminary Absolute Neutrophil Count 4.04    Neutrophils % 48.4    Lymphocytes % 43.2    Monocytes % 7.2    Eosinophils % 0.6    Basophils % 0.4    Immature Granulocytes % 0.2    Absolute Neutrophils 4.04    Absolute Lymphocytes 3.60 (H)    Absolute Monocytes 0.60    Absolute Eosinophils 0.05    Absolute Basophils 0.03    Absolute Immature Granulocytes 0.02   Urinalysis with Reflex to Microscopic Exam and Culture    Collection Time: 10/24/23  5:31 PM    Specimen: Urine, Clean Catch   Result Value    Urine Color Straw    Urine Clarity Clear    Urine Specific Gravity 1.023    Urine pH 6.0    Urine Leukocyte Esterase Negative    Urine Nitrite Negative    Urine Protein 10= Trace (A)    Urine Glucose Negative    Urine Ketones Negative    Urine Urobilinogen Normal    Urine Bilirubin Negative    Urine Blood Negative    RBC, UA 0-2    Urine WBC 0-5    Urine Squamous Epithelial Cells 0-5    Urine Mucus Present (A)   Urine Martina Sledge Culture Hold Tube    Collection Time: 10/24/23  5:31 PM   Result Value    Extra Tube Hold for add-ons.     Collection Time: 10/24/23  5:31 PM   Result Value    Urine HCG Qualitative Negative     No orders to display          [6]   Past Medical History:  Diagnosis Date    Asthma  Endometriosis     Headache     Hypertension     PCOS (polycystic ovarian syndrome)    [7]   Past Surgical History:  Procedure Laterality Date    COLPOSCOPY  2018    HAND SURGERY Left 07/26/2014    PELVIC LAPAROSCOPY  01/04/2023    TONSILLECTOMY  2019    TONSILLECTOMY     [8]   Social History  Tobacco Use    Smoking status: Former     Types: Cigars     Quit date: 2019     Years since quitting: 6.3    Smokeless tobacco: Never   Vaping Use    Vaping status: Never Used   Substance Use Topics    Alcohol use: Yes     Comment: socially    Drug use: Never   [9] No family history on file.  [10]   Allergies  Allergen Reactions    Apple Juice Hives    Banana Hives    Peanut (Diagnostic) Hives    Pear Fruit [Pear] Hives    Plum Pulp Hives        Sydnee Evans, NP  10/25/23 [phone]

## 2023-10-25 LAB — LAB USE ONLY - URINE GRAY CULTURE HOLD TUBE

## 2023-11-25 ENCOUNTER — Emergency Department
Admission: EM | Admit: 2023-11-25 | Discharge: 2023-11-25 | Disposition: A | Discharge status: Home / Self Care | Attending: Emergency Medicine | Admitting: Emergency Medicine

## 2023-11-25 DIAGNOSIS — R112 Nausea with vomiting, unspecified: Secondary | ICD-10-CM | POA: Insufficient documentation

## 2023-11-25 DIAGNOSIS — Z8742 Personal history of other diseases of the female genital tract: Secondary | ICD-10-CM

## 2023-11-25 DIAGNOSIS — R1032 Left lower quadrant pain: Secondary | ICD-10-CM | POA: Insufficient documentation

## 2023-11-25 DIAGNOSIS — R1031 Right lower quadrant pain: Secondary | ICD-10-CM | POA: Insufficient documentation

## 2023-11-25 DIAGNOSIS — R1030 Lower abdominal pain, unspecified: Secondary | ICD-10-CM

## 2023-11-25 LAB — SERUM HCG, QUALITATIVE: hCG Qualitative: NEGATIVE

## 2023-11-25 LAB — COMPREHENSIVE METABOLIC PANEL
ALT: 13 U/L (ref <=55)
AST (SGOT): 21 U/L (ref <=41)
Albumin/Globulin Ratio: 0.9 (ref 0.9–2.2)
Albumin: 3.9 g/dL (ref 3.5–5.0)
Alkaline Phosphatase: 93 U/L (ref 37–117)
Anion Gap: 13 (ref 5.0–15.0)
BUN: 12 mg/dL (ref 7–21)
Bilirubin, Total: 0.4 mg/dL (ref 0.2–1.2)
CO2: 26 meq/L (ref 17–29)
Calcium: 9.7 mg/dL (ref 8.5–10.5)
Chloride: 99 meq/L (ref 99–111)
Creatinine: 1 mg/dL (ref 0.4–1.0)
GFR: >60 mL/min/1.73 m2 (ref >=60.0)
Globulin: 4.5 g/dL — ABNORMAL HIGH (ref 2.0–3.6)
Glucose: 83 mg/dL (ref 70–100)
Potassium: 3.5 meq/L (ref 3.5–5.3)
Protein, Total: 8.4 g/dL — ABNORMAL HIGH (ref 6.0–8.3)
Sodium: 138 meq/L (ref 135–145)

## 2023-11-25 LAB — URINALYSIS WITH REFLEX TO MICROSCOPIC EXAM - REFLEX TO CULTURE
Urine Bilirubin: NEGATIVE
Urine Blood: NEGATIVE
Urine Glucose: NEGATIVE
Urine Ketones: NEGATIVE mg/dL
Urine Nitrite: NEGATIVE
Urine Specific Gravity: 1.025 (ref 1.001–1.035)
Urine Urobilinogen: 3 mg/dL — AB (ref 0.2–2.0)
Urine pH: 6.5 (ref 5.0–8.0)

## 2023-11-25 LAB — LAB USE ONLY - CBC WITH DIFFERENTIAL
Absolute Basophils: 0.05 x10 3/uL (ref 0.00–0.08)
Absolute Eosinophils: 0.03 x10 3/uL (ref 0.00–0.44)
Absolute Immature Granulocytes: 0.03 x10 3/uL (ref 0.00–0.07)
Absolute Lymphocytes: 3.94 x10 3/uL — ABNORMAL HIGH (ref 0.42–3.22)
Absolute Monocytes: 0.72 x10 3/uL (ref 0.21–0.85)
Absolute Neutrophils: 5.62 x10 3/uL (ref 1.10–6.33)
Absolute nRBC: 0 x10 3/uL (ref <=0.00)
Basophils %: 0.5 %
Eosinophils %: 0.3 %
Hematocrit: 37.7 % (ref 34.7–43.7)
Hemoglobin: 12.6 g/dL (ref 11.4–14.8)
Immature Granulocytes %: 0.3 %
Lymphocytes %: 37.9 %
MCH: 31.7 pg (ref 25.1–33.5)
MCHC: 33.4 g/dL (ref 31.5–35.8)
MCV: 94.7 fL (ref 78.0–96.0)
MPV: 8.5 fL — ABNORMAL LOW (ref 8.9–12.5)
Monocytes %: 6.9 %
Neutrophils %: 54.1 %
Platelet Count: 385 x10 3/uL — ABNORMAL HIGH (ref 142–346)
Preliminary Absolute Neutrophil Count: 5.62 x10 3/uL (ref 1.10–6.33)
RBC: 3.98 x10 6/uL (ref 3.90–5.10)
RDW: 12 % (ref 11–15)
WBC: 10.39 x10 3/uL — ABNORMAL HIGH (ref 3.10–9.50)
nRBC %: 0 /100{WBCs} (ref <=0.0)

## 2023-11-25 LAB — LIPASE: Lipase: 25 U/L (ref 8–78)

## 2023-11-25 MED ORDER — PROMETHAZINE HCL 25 MG PO TABS
25.0000 mg | ORAL_TABLET | Freq: Four times a day (QID) | ORAL | 0 refills | Status: AC | PRN
Start: 2023-11-25 — End: ?

## 2023-11-25 MED ORDER — KETOROLAC TROMETHAMINE 30 MG/ML IJ SOLN
15.0000 mg | Freq: Once | INTRAMUSCULAR | Status: AC
Start: 2023-11-25 — End: 2023-11-25
  Administered 2023-11-25: 15 mg via INTRAVENOUS
  Filled 2023-11-25: qty 1

## 2023-11-25 MED ORDER — PROMETHAZINE HCL 25 MG PO TABS
25.0000 mg | ORAL_TABLET | Freq: Once | ORAL | Status: AC
Start: 2023-11-25 — End: 2023-11-25
  Administered 2023-11-25: 25 mg via ORAL
  Filled 2023-11-25: qty 1

## 2023-11-25 MED ORDER — OXYCODONE HCL 5 MG PO TABS
5.0000 mg | ORAL_TABLET | Freq: Four times a day (QID) | ORAL | 0 refills | Status: AC | PRN
Start: 2023-11-25 — End: 2023-12-02

## 2023-11-25 MED ORDER — ONDANSETRON HCL 4 MG/2ML IJ SOLN
4.0000 mg | Freq: Once | INTRAMUSCULAR | Status: AC
Start: 2023-11-25 — End: 2023-11-25
  Administered 2023-11-25: 4 mg via INTRAVENOUS
  Filled 2023-11-25: qty 2

## 2023-11-25 MED ORDER — TETANUS-DIPHTH-ACELL PERTUSSIS 5-2.5-18.5 LF-MCG/0.5 IM SUSP/SUSY (WR)
0.5000 mL | Freq: Once | INTRAMUSCULAR | Status: DC
Start: 2023-11-25 — End: 2023-11-25

## 2023-11-25 MED ORDER — OXYCODONE-ACETAMINOPHEN 5-325 MG PO TABS
1.0000 | ORAL_TABLET | Freq: Once | ORAL | Status: AC
Start: 2023-11-25 — End: 2023-11-25
  Administered 2023-11-25: 1 via ORAL
  Filled 2023-11-25: qty 1

## 2023-11-25 MED ORDER — SODIUM CHLORIDE 0.9 % IV BOLUS
1000.0000 mL | Freq: Once | INTRAVENOUS | Status: AC
Start: 2023-11-25 — End: 2023-11-25
  Administered 2023-11-25: 1000 mL via INTRAVENOUS
  Filled 2023-11-25: qty 1000

## 2023-11-25 NOTE — EDIE (Signed)
 [facility] NOTIFICATION 11/25/2023 10:54 [name], [name] DOB: 1990/07/22 MRN: [id number]    Criteria Met      5 ED Visits in 12 Months    Security and Safety  No Security Events were found.  ED Care Guidelines  There are currently no ED Care Guidelines for this patient. Please check your facility's medical records system.        Prescription Drug Data  No Prescription Drug Data was found.    E.D. Visit Count (12 mo.)  Facility Visits   [place] [facility]: [facility] 10   Total 10   Note: Visits indicate total known visits.     Recent Emergency Department Visit Summary  Date Facility [facility] Type Diagnoses or Chief Complaint    Nov 25, 2023  [place] [facility]: [facility]  [place]  [place]  Emergency      nausea,vomiting      Oct 24, 2023  [place] [facility]: [facility].  [place]  Emergency      Pelvic and perineal pain      Abdominal Pain      Nausea      Abdominal pain,back pain      Sep 26, 2023  [place] [facility]: [facility].  [place]  Emergency      Hypokalemia      Unspecified asthma with (acute) exacerbation      Chest Pain      chest tightness      Sep 12, 2023  [place] [facility]: [facility].  [place]  Emergency      Lower abdominal pain, unspecified      Endometriosis, unspecified      Flank Pain      Nausea      Pelvic Pain      stomach and back pain      Jul 25, 2023  [place] [facility]: [facility].  [place]  Emergency      Personal history of other diseases of the female genital tract      Nausea with vomiting, unspecified      Acute cough      Influenza due to other identified influenza virus with other respiratory manifestations      Emesis      light headed, can't keep food down      Jul 17, 2023  [place] [facility]: [facility].  [place]  Emergency      Other specified noninflammatory disorders of vagina      Influenza due to other identified influenza virus with other respiratory manifestations      Lower  abdominal pain, unspecified      Abdominal Pain      Jun 17, 2023  [place] [facility]: [facility]  [place]  Emergency      Pelvic and perineal pain      Other chronic pain      Abdominal pain      Jun 14, 2023   Emergency Room: [name]  [place]  Emergency      Lower abdominal pain, unspecified      Abdominal Pain      Back Pain      Abdominal pain, Back pain      Mar 26, 2023   Emergency Room: [facility].  [place]  Emergency      Pelvic and perineal pain  Abdominal Pain      Abdominal pain, Back pain      Dec 06, 2022  [place] [facility]: [facility].  [place]  Emergency      Pelvic and perineal pain      Back Pain      Abdominal Pain      abdominal pain, back pain        Recent Inpatient Visit Summary  No Recent Inpatient Visits were found.  Care Team  No Care Team was found.  [facility]  This patient has registered at the [facility] Emergency Room: [facility] Emergency Department  For more information visit: [email or web address]    [email or web address] [email or web address]     PLEASE NOTE:     1.   Any care recommendations and other clinical information are provided as guidelines or for historical purposes only, and providers should exercise their own clinical judgment when providing care.    2.   You may only use this information for purposes of treatment, payment or health care operations activities, and subject to the limitations of applicable [facility] Policies.    3.   You should consult directly with the organization that provided a care guideline or other clinical history with any questions about additional information or accuracy or completeness of information provided.    ? 2025 [facility] - [email or web address]

## 2023-11-25 NOTE — ED Provider Notes (Signed)
 EMERGENCY DEPARTMENT NOTE     Patient initially seen and examined at     [facility] ([facility]) ASSIGNED       Date/Time Event User Comments    11/25/23 1703 PA/NP Provider Assigned Garin Mata L Gonzella Launa CROME, FNP assigned as Nurse Practitioner            HISTORY OF PRESENT ILLNESS   Translator Used : No    Chief Complaint: Abdominal Pain and Emesis       History of Present Illness  Angela [name] is a 33 year old female with PCOS and endometriosis who presents with lower abdominal pain, nausea, and vomiting which she states is an endometriosis flare.    Abdominal pain has persisted for two weeks, beginning before her menstrual period. The pain is located in the lower abdomen and lower back, affecting both sides, and is described as an internal pain.    Nausea and vomiting began on Monday (4 days ago), with a couple of episodes of vomiting today. Oral zofran  was used for nausea without relief. She last took ibuprofen  a couple of days ago.      There are no urinary symptoms such as burning or frequency, and no abnormal vaginal discharge. She reports no fever.    States she is on combination of birth control pills and metformin.    Reports her GYN recommended that she see a surgeon for her endometriosis pain, has used promethazine  in the past for n/v but states since she was told to save a surgeon her GYN hasn't refilled it.    Independent Historian (other than patient): No  Additional History Provided by Independent Historian:  MEDICAL HISTORY     Past Medical History:  Past Medical History:   Diagnosis Date    Asthma     Endometriosis     Headache     Hypertension     PCOS (polycystic ovarian syndrome)        Past Surgical History:  Past Surgical History[1]    Social History:  Social History[2]     Family History:  Family History[3]    Outpatient Medication:  Discharge Medication List as of 11/25/2023  7:16 PM        CONTINUE these medications which have NOT CHANGED    Details   acetaminophen  (TYLENOL )  325 MG tablet Take 2 tablets (650 mg) by mouth every 6 (six) hours as needed for Pain, Starting Sun 10/24/2022, E-Rx      albuterol  sulfate HFA (PROVENTIL ) 108 (90 Base) MCG/ACT inhaler Inhale 2 puffs into the lungs every 6 (six) hours as needed for Wheezing, Historical Med      amLODIPine (NORVASC) 2.5 MG tablet Take 1 tablet (2.5 mg) by mouth daily, Historical Med      butalbital -acetaminophen -caffeine  (FIORICET) 50-325-40 MG per tablet Take 1 tablet by mouth every 4 (four) hours as needed for Headaches, Starting Sat 01/16/2022, E-Rx      docusate sodium  (COLACE) 100 MG capsule Take 1 capsule (100 mg total) by mouth daily, Starting Mon 04/21/2020, No Print      Elagolix Sodium 200 MG Tab Take by mouth, Historical Med      famotidine (PEPCID) 20 MG tablet Take 20 mg by mouth every 12 (twelve) hours, Historical Med      hydrocortisone  1 % ointment Apply topically 3 (three) times daily as needed (perineal care), Starting Sun 04/20/2020, No Print      ibuprofen  (ADVIL ) 800 MG tablet Take 1 tablet (800 mg)  by mouth every 6 (six) hours as needed for Pain or Fever, Starting Mon 10/24/2023, Normal      ketorolac  (TORADOL ) 10 MG tablet Take 1 tablet (10 mg) by mouth every 6 (six) hours as needed for Pain, Starting Mon 12/06/2022, E-Rx      lanolin ointment Apply topically every 2 (two) hours as needed for Dry Skin (nipple discomfort), Starting Sun 04/20/2020, No Print      norgestimate-ethinyl estradiol (ORTHO-CYCLEN) 0.25-35 MG-MCG per tablet Take 1 tablet by mouth daily, Historical Med      ondansetron  (ZOFRAN -ODT) 4 MG disintegrating tablet Dissolve 1 tablet (4 mg) in the mouth every 6 (six) hours as needed for Nausea, Starting Mon 10/24/2023, E-Rx      Prenatal MV-Min-Fe Fum-FA-DHA (PRENATAL 1 PO) Take by mouth, Historical Med      senna-docusate (PERICOLACE) 8.6-50 MG per tablet Take 1 tablet by mouth nightly as needed for Constipation, Starting Sun 04/20/2020, No Print      traZODone (DESYREL) 50 MG tablet Take 1 tablet  (50 mg) by mouth nightly, Historical Med               REVIEW OF SYSTEMS   Review of Systems See History of Present Illness  PHYSICAL EXAM     ED Triage Vitals [11/25/23 1659]   Encounter Vitals Group      BP 129/90      Systolic BP Percentile       Diastolic BP Percentile       Heart Rate 81      Resp Rate 18      Temp 97.6 F (36.4 C)      Temp src Temporal      SpO2 100 %      Weight 104.3 kg      Height 1.626 m      Head Circumference       Peak Flow       Pain Score 10      Pain Loc       Pain Education       Exclude from Growth Chart      Physical Exam  Vitals and nursing note reviewed.   Constitutional:       Appearance: Normal appearance. She is well-developed. She is not toxic-appearing.   HENT:      Head: Normocephalic and atraumatic.   Cardiovascular:      Rate and Rhythm: Normal rate and regular rhythm.   Abdominal:      General: Abdomen is protuberant.      Palpations: Abdomen is soft.      Tenderness: There is no abdominal tenderness.   Musculoskeletal:      Cervical back: Normal range of motion and neck supple.   Neurological:      Mental Status: She is alert.        MEDICAL DECISION MAKING     PRIMARY PROBLEM LIST      Chronic Illness with Exacerbation/Progression DIAGNOSIS: Lower abdominal pain, history of endometriosis  Chronic Illness Impacting Care of the above problem: Obesity Increases complexity of evaluation  Differentials include: UTI, acute on chronic pain, PCOS, endometriosis flare    DISCUSSION      Patient with exacerbation of chronic lower abdominal pain due to endometriosis.  Also reports nausea and vomiting which also occurs during her flares.  Plan for labs and meds.  She has no lower abdominal tenderness.  Pain is not localized to 1 side.  Doubt acute surgical process, I do  not think she needs imaging at this time.    Labs are unremarkable.  Updated patient with results.  Patient reports no improvement after zofran  and toradol .   Will try oral phenergan  as she has used this  previously.  Once nausea has improved will try Percocet.  Patient states husband will drive her home.    After Phenergan , nausea improved and she was able to tolerate oral fluids and Percocet.  Patient reports she feels comfortable discharge home at this time with oral Percocet.      Reviewed external records, [place]  PMP.  Last fill of oxycodone  5 mg (6 tablets) October 24, 2023 (1 month ago).  Prescribed 6 tablets of oxycodone  5 as Tylenol  and ibuprofen  are not controlling pain at home.  Will also prescribe Phenergan  as Zofran  is not controlling nausea and vomiting at home.    Recommend follow-up with surgeon as previously recommended by her GYN.  Return if worse.    Discussed case with ED attending, Dr. Quinton, who is agreeable to the plan of care, treatment and disposition.       Vital Signs: Reviewed the patient's vital signs.   Nursing Notes: Reviewed and utilized available nursing notes.  Medical Records Reviewed: Reviewed available past medical records.  Counseling: The emergency provider has spoken with the patient and discussed today's findings, in addition to providing specific details for the plan of care.  Questions are answered and there is agreement with the plan.        RADIOLOGY IMAGING STUDIES      No orders to display         EMERGENCY DEPT. MEDICATIONS      ED Medication Orders (From admission, onward)      Start Ordered     Status Ordering Provider    11/25/23 1830 11/25/23 1829  promethazine  (PHENERGAN ) tablet 25 mg  Once        Route: Oral  Ordered Dose: 25 mg       Last MAR action: Given Tacy Chavis L    11/25/23 1830 11/25/23 1829  oxyCODONE -acetaminophen  (PERCOCET) 5-325 MG per tablet 1 tablet  Once        Route: Oral  Ordered Dose: 1 tablet       Last MAR action: Given Arkie Tagliaferro L    11/25/23 1802 11/25/23 1801    Once        Route: Intramuscular  Ordered Dose: 0.5 mL       Discontinued Treyven Lafauci L    11/25/23 1725 11/25/23 1724  ondansetron  (ZOFRAN ) injection 4 mg   Once        Route: Intravenous  Ordered Dose: 4 mg       Last MAR action: Given Kionte Baumgardner L    11/25/23 1725 11/25/23 1724  sodium chloride  0.9 % bolus 1,000 mL  Once        Route: Intravenous  Ordered Dose: 1,000 mL       Last MAR action: Stopped Marjarie Irion L    11/25/23 1725 11/25/23 1724  ketorolac  (TORADOL ) injection 15 mg  Once        Route: Intravenous  Ordered Dose: 15 mg       Last MAR action: Given Fares Ramthun L            LABORATORY RESULTS    Ordered and independently interpreted AVAILABLE laboratory tests.   Results         Results for orders placed or performed during the hospital  encounter of 11/25/23 (from the past 24 hours)   Comprehensive Metabolic Panel    Collection Time: 11/25/23  5:11 PM   Result Value    Glucose 83    BUN 12    Creatinine 1.0    Sodium 138    Potassium 3.5    Chloride 99    CO2 26    Calcium  9.7    Anion Gap 13.0    GFR >60.0    AST (SGOT) 21    ALT 13    Alkaline Phosphatase 93    Albumin 3.9    Protein, Total 8.4 (H)    Globulin 4.5 (H)    Albumin/Globulin Ratio 0.9    Bilirubin, Total 0.4    Collection Time: 11/25/23  5:11 PM   Result Value    Lipase 25   Urinalysis with Reflex to Microscopic Exam and Culture    Collection Time: 11/25/23  5:11 PM    Specimen: Urine, Clean Catch   Result Value    Urine Color Yellow    Urine Clarity Clear    Urine Specific Gravity 1.025    Urine pH 6.5    Urine Leukocyte Esterase Trace (A)    Urine Nitrite Negative    Urine Protein 30= 1+ (A)    Urine Glucose Negative    Urine Ketones Negative    Urine Urobilinogen 3.0 (A)    Urine Bilirubin Negative    Urine Blood Negative    RBC, UA 3-5    Urine WBC 0-5    Urine Squamous Epithelial Cells 6-10 (A)    Urine Mucus Present (A)   Serum Beta HCG Qualitative    Collection Time: 11/25/23  5:11 PM   Result Value    hCG Qualitative Negative   CBC with Differential (Component)    Collection Time: 11/25/23  5:11 PM   Result Value    WBC 10.39 (H)    Hemoglobin 12.6    Hematocrit  37.7    Platelet Count 385 (H)    MPV 8.5 (L)    RBC 3.98    MCV 94.7    MCH 31.7    MCHC 33.4    RDW 12    nRBC % 0.0    Absolute nRBC 0.00    Preliminary Absolute Neutrophil Count 5.62    Neutrophils % 54.1    Lymphocytes % 37.9    Monocytes % 6.9    Eosinophils % 0.3    Basophils % 0.5    Immature Granulocytes % 0.3    Absolute Neutrophils 5.62    Absolute Lymphocytes 3.94 (H)    Absolute Monocytes 0.72    Absolute Eosinophils 0.03    Absolute Basophils 0.05    Absolute Immature Granulocytes 0.03         CRITICAL CARE/PROCEDURES    Procedures    DIAGNOSIS      Diagnosis:  Final diagnoses:   Lower abdominal pain   History of endometriosis       Disposition:  ED Disposition       ED Disposition   Discharge    Condition   --    Date/Time   Fri Nov 25, 2023  7:16 PM    Comment   Joya Jewell Clerk discharge to home/self care.    Condition at disposition: Stable                 Prescriptions:  Discharge Medication List as of 11/25/2023  7:16 PM  START taking these medications    Details   oxyCODONE  (ROXICODONE ) 5 MG immediate release tablet Take 1 tablet (5 mg) by mouth every 6 (six) hours as needed for Pain, Starting Fri 11/25/2023, Until Fri 12/02/2023 at 2359, E-Rx           CONTINUE these medications which have CHANGED    Details   promethazine  (PHENERGAN ) 25 MG tablet Take 1 tablet (25 mg) by mouth every 6 (six) hours as needed for Nausea, Starting Fri 11/25/2023, E-Rx           CONTINUE these medications which have NOT CHANGED    Details   acetaminophen  (TYLENOL ) 325 MG tablet Take 2 tablets (650 mg) by mouth every 6 (six) hours as needed for Pain, Starting Sun 10/24/2022, E-Rx      albuterol  sulfate HFA (PROVENTIL ) 108 (90 Base) MCG/ACT inhaler Inhale 2 puffs into the lungs every 6 (six) hours as needed for Wheezing, Historical Med      amLODIPine (NORVASC) 2.5 MG tablet Take 1 tablet (2.5 mg) by mouth daily, Historical Med      butalbital -acetaminophen -caffeine  (FIORICET) 50-325-40 MG per tablet Take 1 tablet  by mouth every 4 (four) hours as needed for Headaches, Starting Sat 01/16/2022, E-Rx      docusate sodium  (COLACE) 100 MG capsule Take 1 capsule (100 mg total) by mouth daily, Starting Mon 04/21/2020, No Print      Elagolix Sodium 200 MG Tab Take by mouth, Historical Med      famotidine (PEPCID) 20 MG tablet Take 20 mg by mouth every 12 (twelve) hours, Historical Med      hydrocortisone  1 % ointment Apply topically 3 (three) times daily as needed (perineal care), Starting Sun 04/20/2020, No Print      ibuprofen  (ADVIL ) 800 MG tablet Take 1 tablet (800 mg) by mouth every 6 (six) hours as needed for Pain or Fever, Starting Mon 10/24/2023, Normal      ketorolac  (TORADOL ) 10 MG tablet Take 1 tablet (10 mg) by mouth every 6 (six) hours as needed for Pain, Starting Mon 12/06/2022, E-Rx      lanolin ointment Apply topically every 2 (two) hours as needed for Dry Skin (nipple discomfort), Starting Sun 04/20/2020, No Print      norgestimate-ethinyl estradiol (ORTHO-CYCLEN) 0.25-35 MG-MCG per tablet Take 1 tablet by mouth daily, Historical Med      ondansetron  (ZOFRAN -ODT) 4 MG disintegrating tablet Dissolve 1 tablet (4 mg) in the mouth every 6 (six) hours as needed for Nausea, Starting Mon 10/24/2023, E-Rx      Prenatal MV-Min-Fe Fum-FA-DHA (PRENATAL 1 PO) Take by mouth, Historical Med      senna-docusate (PERICOLACE) 8.6-50 MG per tablet Take 1 tablet by mouth nightly as needed for Constipation, Starting Sun 04/20/2020, No Print      traZODone (DESYREL) 50 MG tablet Take 1 tablet (50 mg) by mouth nightly, Historical Med                        [1]   Past Surgical History:  Procedure Laterality Date    COLPOSCOPY  2018    HAND SURGERY Left 07/26/2014    PELVIC LAPAROSCOPY  01/04/2023    TONSILLECTOMY  2019    TONSILLECTOMY     [2]   Social History  Tobacco Use    Smoking status: Former     Types: Cigars     Quit date: 2019     Years since quitting: 6.4  Smokeless tobacco: Never   Vaping Use    Vaping status: Never Used    Substance Use Topics    Alcohol use: Yes     Comment: socially    Drug use: Never   [3] No family history on file.       Gonzella Launa CROME, FNP  11/25/23 1940

## 2023-11-25 NOTE — ED Notes (Signed)
 EMERGENCY DEPARTMENT ATTENDING PHYSICIAN NOTE     I performed the substantive portion of the MDM. For the problems addressed, I personally developed, reviewed, and/or approved the plan and assessment as documented by the APP.  Patient was seen by APP, see their note for further documentation of history and exam.    BRIEF HISTORY OF PRESENT ILLNESS AND ADDITIONAL EXAM FINDINGS     Chief Complaint: Abdominal Pain and Emesis       History per APP    33 y.o. female presents with lower abd pain , nausea and vomiting. Pt has hx of endometriosis.      Triage Vitals:  ED Triage Vitals [11/25/23 1659]   Encounter Vitals Group      BP 129/90      Systolic BP Percentile       Diastolic BP Percentile       Heart Rate 81      Resp Rate 18      Temp 97.6 F (36.4 C)      Temp src Temporal      SpO2 100 %      Weight 104.3 kg      Height 1.626 m      Head Circumference       Peak Flow       Pain Score 10      Pain Loc       Pain Education       Exclude from Growth Chart             MEDICAL DECISION MAKING     Cbc, cmp, lipase normal   Hcg negative  Ua negative    Symptoms likely due to endometriosis       Vital Signs: Reviewed the patient's vital signs.   Nursing Notes: Reviewed and utilized available nursing notes.  Medical Records Reviewed: Reviewed available past medical records.    CARDIAC STUDIES    The following cardiac studies were independently interpreted by me the Emergency Medicine Physician.  For full cardiac study results please see chart. I discussed testing results with the APP.              EMERGENCY IMAGING STUDIES    The following imagine studies were independently interpreted by me (emergency medicine physician). I discussed testing results with the APP.                     RADIOLOGY IMAGING STUDIES      No orders to display       EMERGENCY DEPT. MEDICATIONS      ED Medication Orders (From admission, onward)      Start Ordered     Status Ordering Provider    11/25/23 1830 11/25/23 1829  promethazine  (PHENERGAN )  tablet 25 mg  Once        Route: Oral  Ordered Dose: 25 mg       Last MAR action: Given LEDWITH, MECHELLE L    11/25/23 1830 11/25/23 1829  oxyCODONE -acetaminophen  (PERCOCET) 5-325 MG per tablet 1 tablet  Once        Route: Oral  Ordered Dose: 1 tablet       Last MAR action: Given LEDWITH, MECHELLE L    11/25/23 1802 11/25/23 1801    Once        Route: Intramuscular  Ordered Dose: 0.5 mL       Discontinued LEDWITH, MECHELLE L    11/25/23 1725 11/25/23 1724  ondansetron  (ZOFRAN ) injection 4 mg  Once        Route: Intravenous  Ordered Dose: 4 mg       Last MAR action: Given LEDWITH, MECHELLE L    11/25/23 1725 11/25/23 1724  sodium chloride  0.9 % bolus 1,000 mL  Once        Route: Intravenous  Ordered Dose: 1,000 mL       Last MAR action: Stopped LEDWITH, MECHELLE L    11/25/23 1725 11/25/23 1724  ketorolac  (TORADOL ) injection 15 mg  Once        Route: Intravenous  Ordered Dose: 15 mg       Last MAR action: Given LEDWITH, MECHELLE L            LABORATORY RESULTS    Ordered and independently interpreted AVAILABLE laboratory tests.   Results for orders placed or performed during the hospital encounter of 11/25/23 (from the past 24 hours)   Comprehensive Metabolic Panel    Collection Time: 11/25/23  5:11 PM   Result Value    Glucose 83    BUN 12    Creatinine 1.0    Sodium 138    Potassium 3.5    Chloride 99    CO2 26    Calcium  9.7    Anion Gap 13.0    GFR >60.0    AST (SGOT) 21    ALT 13    Alkaline Phosphatase 93    Albumin 3.9    Protein, Total 8.4 (H)    Globulin 4.5 (H)    Albumin/Globulin Ratio 0.9    Bilirubin, Total 0.4    Collection Time: 11/25/23  5:11 PM   Result Value    Lipase 25   Urinalysis with Reflex to Microscopic Exam and Culture    Collection Time: 11/25/23  5:11 PM    Specimen: Urine, Clean Catch   Result Value    Urine Color Yellow    Urine Clarity Clear    Urine Specific Gravity 1.025    Urine pH 6.5    Urine Leukocyte Esterase Trace (A)    Urine Nitrite Negative    Urine Protein 30= 1+ (A)     Urine Glucose Negative    Urine Ketones Negative    Urine Urobilinogen 3.0 (A)    Urine Bilirubin Negative    Urine Blood Negative    RBC, UA 3-5    Urine WBC 0-5    Urine Squamous Epithelial Cells 6-10 (A)    Urine Mucus Present (A)   Serum Beta HCG Qualitative    Collection Time: 11/25/23  5:11 PM   Result Value    hCG Qualitative Negative   CBC with Differential (Component)    Collection Time: 11/25/23  5:11 PM   Result Value    WBC 10.39 (H)    Hemoglobin 12.6    Hematocrit 37.7    Platelet Count 385 (H)    MPV 8.5 (L)    RBC 3.98    MCV 94.7    MCH 31.7    MCHC 33.4    RDW 12    nRBC % 0.0    Absolute nRBC 0.00    Preliminary Absolute Neutrophil Count 5.62    Neutrophils % 54.1    Lymphocytes % 37.9    Monocytes % 6.9    Eosinophils % 0.3    Basophils % 0.5    Immature Granulocytes % 0.3    Absolute Neutrophils 5.62    Absolute Lymphocytes 3.94 (H)    Absolute Monocytes  0.72    Absolute Eosinophils 0.03    Absolute Basophils 0.05    Absolute Immature Granulocytes 0.03         CRITICAL CARE/PROCEDURES        DIAGNOSIS    I (ED Physician) discussed final disposition with the APP    Diagnosis:  Final diagnoses:   Lower abdominal pain   History of endometriosis       Disposition:  ED Disposition       ED Disposition   Discharge    Condition   --    Date/Time   Fri Nov 25, 2023  7:16 PM    Comment   Angela [name] discharge to home/self care.    Condition at disposition: Stable                 Prescriptions:  Patient's Medications   New Prescriptions    OXYCODONE  (ROXICODONE ) 5 MG IMMEDIATE RELEASE TABLET    Take 1 tablet (5 mg) by mouth every 6 (six) hours as needed for Pain    PROMETHAZINE  (PHENERGAN ) 25 MG TABLET    Take 1 tablet (25 mg) by mouth every 6 (six) hours as needed for Nausea   Previous Medications    ACETAMINOPHEN  (TYLENOL ) 325 MG TABLET    Take 2 tablets (650 mg) by mouth every 6 (six) hours as needed for Pain    ALBUTEROL  SULFATE HFA (PROVENTIL ) 108 (90 BASE) MCG/ACT INHALER    Inhale 2 puffs  into the lungs every 6 (six) hours as needed for Wheezing    AMLODIPINE (NORVASC) 2.5 MG TABLET    Take 1 tablet (2.5 mg) by mouth daily    BUTALBITAL -ACETAMINOPHEN -CAFFEINE  (FIORICET) 50-325-40 MG PER TABLET    Take 1 tablet by mouth every 4 (four) hours as needed for Headaches    DOCUSATE SODIUM  (COLACE) 100 MG CAPSULE    Take 1 capsule (100 mg total) by mouth daily    ELAGOLIX SODIUM 200 MG TAB    Take by mouth    FAMOTIDINE (PEPCID) 20 MG TABLET    Take 20 mg by mouth every 12 (twelve) hours    HYDROCORTISONE  1 % OINTMENT    Apply topically 3 (three) times daily as needed (perineal care)    IBUPROFEN  (ADVIL ) 800 MG TABLET    Take 1 tablet (800 mg) by mouth every 6 (six) hours as needed for Pain or Fever    KETOROLAC  (TORADOL ) 10 MG TABLET    Take 1 tablet (10 mg) by mouth every 6 (six) hours as needed for Pain    LANOLIN OINTMENT    Apply topically every 2 (two) hours as needed for Dry Skin (nipple discomfort)    NORGESTIMATE-ETHINYL ESTRADIOL (ORTHO-CYCLEN) 0.25-35 MG-MCG PER TABLET    Take 1 tablet by mouth daily    ONDANSETRON  (ZOFRAN -ODT) 4 MG DISINTEGRATING TABLET    Dissolve 1 tablet (4 mg) in the mouth every 6 (six) hours as needed for Nausea    PRENATAL MV-MIN-FE FUM-FA-DHA (PRENATAL 1 PO)    Take by mouth    SENNA-DOCUSATE (PERICOLACE) 8.6-50 MG PER TABLET    Take 1 tablet by mouth nightly as needed for Constipation    TRAZODONE (DESYREL) 50 MG TABLET    Take 1 tablet (50 mg) by mouth nightly   Modified Medications    No medications on file   Discontinued Medications    DIPHENHYDRAMINE HCL PO    Take by mouth    PROMETHAZINE  (PHENERGAN ) 25 MG TABLET  Take 1 tablet (25 mg) by mouth every 6 (six) hours as needed for Nausea          Quinton Cheron MATSU, DO  11/25/23 1925

## 2023-11-26 LAB — LAB USE ONLY - URINE GRAY CULTURE HOLD TUBE
